# Patient Record
Sex: Male | Born: 1960 | Race: White | Hispanic: No | Marital: Single | Smoking: Never smoker
Health system: Southern US, Community
[De-identification: ages and names within clinical notes are randomized; demographics above are authoritative.]

## PROBLEM LIST (undated history)

## (undated) DIAGNOSIS — K219 Gastro-esophageal reflux disease without esophagitis: Secondary | ICD-10-CM

## (undated) DIAGNOSIS — C8330 Diffuse large B-cell lymphoma, unspecified site: Secondary | ICD-10-CM

## (undated) DIAGNOSIS — J45909 Unspecified asthma, uncomplicated: Secondary | ICD-10-CM

## (undated) DIAGNOSIS — E559 Vitamin D deficiency, unspecified: Secondary | ICD-10-CM

## (undated) DIAGNOSIS — E785 Hyperlipidemia, unspecified: Secondary | ICD-10-CM

## (undated) DIAGNOSIS — C8599 Non-Hodgkin lymphoma, unspecified, extranodal and solid organ sites: Secondary | ICD-10-CM

## (undated) DIAGNOSIS — E538 Deficiency of other specified B group vitamins: Secondary | ICD-10-CM

## (undated) DIAGNOSIS — T7840XA Allergy, unspecified, initial encounter: Secondary | ICD-10-CM

## (undated) DIAGNOSIS — R351 Nocturia: Secondary | ICD-10-CM

## (undated) DIAGNOSIS — G4730 Sleep apnea, unspecified: Secondary | ICD-10-CM

## (undated) DIAGNOSIS — M1990 Unspecified osteoarthritis, unspecified site: Secondary | ICD-10-CM

## (undated) DIAGNOSIS — I10 Essential (primary) hypertension: Secondary | ICD-10-CM

## (undated) DIAGNOSIS — Z9289 Personal history of other medical treatment: Secondary | ICD-10-CM

## (undated) DIAGNOSIS — M2550 Pain in unspecified joint: Secondary | ICD-10-CM

## (undated) DIAGNOSIS — E10319 Type 1 diabetes mellitus with unspecified diabetic retinopathy without macular edema: Secondary | ICD-10-CM

## (undated) DIAGNOSIS — E119 Type 2 diabetes mellitus without complications: Secondary | ICD-10-CM

## (undated) HISTORY — DX: Unspecified osteoarthritis, unspecified site: M19.90

## (undated) HISTORY — PX: ESOPHAGOGASTRODUODENOSCOPY: SHX1529

## (undated) HISTORY — DX: Non-Hodgkin lymphoma, unspecified, extranodal and solid organ sites: C85.99

## (undated) HISTORY — DX: Sleep apnea, unspecified: G47.30

## (undated) HISTORY — DX: Vitamin D deficiency, unspecified: E55.9

## (undated) HISTORY — DX: Allergy, unspecified, initial encounter: T78.40XA

## (undated) HISTORY — PX: LAPAROSCOPIC INCISIONAL / UMBILICAL / VENTRAL HERNIA REPAIR: SUR789

## (undated) HISTORY — PX: WISDOM TOOTH EXTRACTION: SHX21

## (undated) HISTORY — PX: OTHER SURGICAL HISTORY: SHX169

## (undated) HISTORY — PX: HERNIA REPAIR: SHX51

## (undated) HISTORY — DX: Type 1 diabetes mellitus with unspecified diabetic retinopathy without macular edema: E10.319

## (undated) HISTORY — DX: Deficiency of other specified B group vitamins: E53.8

## (undated) HISTORY — DX: Hyperlipidemia, unspecified: E78.5

---

## 1979-05-15 HISTORY — PX: PILONIDAL CYST EXCISION: SHX744

## 2001-05-14 DIAGNOSIS — Z9289 Personal history of other medical treatment: Secondary | ICD-10-CM

## 2001-05-14 HISTORY — DX: Personal history of other medical treatment: Z92.89

## 2001-05-14 HISTORY — PX: CHOLECYSTECTOMY: SHX55

## 2001-05-14 HISTORY — PX: WHIPPLE PROCEDURE: SHX2667

## 2002-01-28 ENCOUNTER — Encounter: Payer: Self-pay | Admitting: Gastroenterology

## 2002-01-28 ENCOUNTER — Ambulatory Visit (HOSPITAL_COMMUNITY): Admission: RE | Admit: 2002-01-28 | Discharge: 2002-01-28 | Payer: Self-pay | Admitting: Gastroenterology

## 2002-01-29 ENCOUNTER — Observation Stay (HOSPITAL_COMMUNITY): Admission: RE | Admit: 2002-01-29 | Discharge: 2002-01-30 | Payer: Self-pay | Admitting: Gastroenterology

## 2002-01-29 ENCOUNTER — Encounter: Payer: Self-pay | Admitting: Gastroenterology

## 2002-01-29 ENCOUNTER — Encounter (INDEPENDENT_AMBULATORY_CARE_PROVIDER_SITE_OTHER): Payer: Self-pay | Admitting: *Deleted

## 2002-02-06 ENCOUNTER — Ambulatory Visit (HOSPITAL_COMMUNITY): Admission: RE | Admit: 2002-02-06 | Discharge: 2002-02-06 | Payer: Self-pay | Admitting: Gastroenterology

## 2002-02-06 ENCOUNTER — Encounter (INDEPENDENT_AMBULATORY_CARE_PROVIDER_SITE_OTHER): Payer: Self-pay | Admitting: Specialist

## 2002-02-14 DIAGNOSIS — C8599 Non-Hodgkin lymphoma, unspecified, extranodal and solid organ sites: Secondary | ICD-10-CM

## 2002-02-14 HISTORY — DX: Non-Hodgkin lymphoma, unspecified, extranodal and solid organ sites: C85.99

## 2002-03-13 ENCOUNTER — Ambulatory Visit (HOSPITAL_COMMUNITY): Admission: RE | Admit: 2002-03-13 | Discharge: 2002-03-13 | Payer: Self-pay | Admitting: Endocrinology

## 2002-03-13 ENCOUNTER — Encounter: Payer: Self-pay | Admitting: Endocrinology

## 2002-07-15 ENCOUNTER — Ambulatory Visit: Admission: RE | Admit: 2002-07-15 | Discharge: 2002-08-28 | Payer: Self-pay | Admitting: Radiation Oncology

## 2002-08-22 ENCOUNTER — Emergency Department (HOSPITAL_COMMUNITY): Admission: EM | Admit: 2002-08-22 | Discharge: 2002-08-22 | Payer: Self-pay | Admitting: Emergency Medicine

## 2002-10-01 ENCOUNTER — Ambulatory Visit: Admission: RE | Admit: 2002-10-01 | Discharge: 2002-10-01 | Payer: Self-pay | Admitting: Radiation Oncology

## 2007-05-15 HISTORY — PX: COLONOSCOPY: SHX174

## 2008-07-15 ENCOUNTER — Ambulatory Visit: Payer: Self-pay | Admitting: Internal Medicine

## 2008-07-28 ENCOUNTER — Encounter: Payer: Self-pay | Admitting: Internal Medicine

## 2008-07-28 ENCOUNTER — Ambulatory Visit (HOSPITAL_BASED_OUTPATIENT_CLINIC_OR_DEPARTMENT_OTHER): Admission: RE | Admit: 2008-07-28 | Discharge: 2008-07-28 | Payer: Self-pay | Admitting: Internal Medicine

## 2008-08-01 DIAGNOSIS — J309 Allergic rhinitis, unspecified: Secondary | ICD-10-CM | POA: Insufficient documentation

## 2008-08-01 DIAGNOSIS — T61781A Other shellfish poisoning, accidental (unintentional), initial encounter: Secondary | ICD-10-CM | POA: Insufficient documentation

## 2008-08-01 DIAGNOSIS — T6191XA Toxic effect of unspecified seafood, accidental (unintentional), initial encounter: Secondary | ICD-10-CM | POA: Insufficient documentation

## 2008-08-01 DIAGNOSIS — E119 Type 2 diabetes mellitus without complications: Secondary | ICD-10-CM | POA: Insufficient documentation

## 2008-08-24 ENCOUNTER — Telehealth (INDEPENDENT_AMBULATORY_CARE_PROVIDER_SITE_OTHER): Payer: Self-pay | Admitting: *Deleted

## 2008-09-06 ENCOUNTER — Telehealth: Payer: Self-pay | Admitting: Internal Medicine

## 2008-09-09 ENCOUNTER — Ambulatory Visit: Payer: Self-pay | Admitting: Internal Medicine

## 2008-09-16 ENCOUNTER — Encounter: Payer: Self-pay | Admitting: Internal Medicine

## 2008-09-19 DIAGNOSIS — G4733 Obstructive sleep apnea (adult) (pediatric): Secondary | ICD-10-CM | POA: Insufficient documentation

## 2008-09-29 ENCOUNTER — Encounter: Payer: Self-pay | Admitting: Internal Medicine

## 2008-10-14 ENCOUNTER — Ambulatory Visit: Payer: Self-pay | Admitting: Internal Medicine

## 2009-04-21 ENCOUNTER — Ambulatory Visit: Payer: Self-pay | Admitting: Internal Medicine

## 2010-04-20 ENCOUNTER — Ambulatory Visit: Payer: Self-pay | Admitting: Internal Medicine

## 2010-04-20 DIAGNOSIS — E785 Hyperlipidemia, unspecified: Secondary | ICD-10-CM | POA: Insufficient documentation

## 2010-06-15 ENCOUNTER — Encounter: Payer: Self-pay | Admitting: Internal Medicine

## 2010-06-15 NOTE — Assessment & Plan Note (Signed)
Summary: rov/1 year/ mbw   Primary Provider/Referring Provider:  Altheimer  CC:  1 yr followup sleep, cpap working fine, and denies sob.  History of Present Illness: 09/09/08- OSA, allergic rhinitis NPSG- AHI 15.3 07/28/08 Moderate obstructive sleep apnea Discussed treatment options. We will start with CPAP.  10/14/08- OSA, allergic rhinitis Fair start with CPAP. Good compliance. He's not sure how much it helps yet. He did get one mask change and that seems comfortable. We discussed expectations. Daytime somnolence   April 21, 2009  OSA, allergic rhinitis He didn't try Lunesta. He continues CPAP 15 every night. He didn't take it on a 3 week trip to [place]. It isn't uncomfortable. Sometimes he's a little restless. In May he autotitrated to 13. Still doesn't feel daytime sleepiness and didn't notice that he slept differently on his trip, but he was told there he was snoring.  April 20, 2010-   OSA, allergic rhinitis Nurse-CC: 1 yr followup sleep, cpap working fine, denies sob He is doing well with CPAP at 13. Wears a pad to cushion the strap. He has tried a few nights without it and not noted major difference. Daytime tiredness isn't a problem. Never took Lunesta- not needed.  No routine cough, wheeze, chest pain.  He follows closely with his PCP.     Preventive Screening-Counseling & Management  Alcohol-Tobacco     Smoking Status: never  Current Medications (verified): 1)  Adult Aspirin Low Strength 81 Mg Tbdp (Aspirin) .... Take 1 By Mouth Once Daily 2)  Insulin Pump 3)  Vitamin D 16109 Unit Caps (Ergocalciferol) .... Take 1 By Mouth Weekly 4)  Cyanocobalamin 1000 Mcg/ml Soln (Cyanocobalamin) .... Monthly 5)  Cpap 13 Advanced 6)  Crestor 10 Mg Tabs (Rosuvastatin Calcium) .Marland Kitchen.. 1 By Mouth 5 Days A Week  Allergies: 1)  ! * Shellfish  Past History:  Past Surgical History: Last updated: 07/15/2008 Cholecystectomy [place]-2003- lymphoma head of pancreas  Family  History: Last updated: 07/15/2008 Father has OSA  Social History: Last updated: 07/15/2008 Not married Patient never smoked.  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  The patient denies shortness of breath with activity, shortness of breath at rest, productive cough, non-productive cough, coughing up blood, chest pain, irregular heartbeats, acid heartburn, indigestion, loss of appetite, abdominal pain, difficulty swallowing, sore throat, nasal congestion/difficulty breathing through nose, and sneezing.         9 lb weight loss over past year.  Vital Signs:  Patient profile:   50 year old male Height:      69 inches Weight:      214.50 pounds BMI:     31.79 O2 Sat:      97 % on Room air Pulse rate:   51 / minute BP sitting:   110 / 60  (left arm) Cuff size:   large  Vitals Entered By: Kandice Hams CMA (April 20, 2010 2:04 PM)  O2 Flow:  Room air CC: 1 yr followup sleep, cpap working fine, denies sob Comments pharamcy verfied allergies verfied   Physical Exam  Additional Exam:  General: A/Ox3;  pleasant and cooperative, NAD, alert, medium build SKIN: no rash, lesions NODES: no lymphadenopathy HEENT: [place]/AT, EOM- WNL, Conjuctivae- clear, PERRLA, TM-WNL, Nose- clear, Throat- clear and wnl, Mallampati  IV NECK: Supple w/ fair ROM, JVD- none, normal carotid impulses w/o bruits Thyroid-  CHEST: Clear to P&A HEART: RRR, no m/g/r heard ABDOMEN scaphoid EAV:WUJW, nl pulses, no edema  NEURO: Grossly intact to observation      Impression &  Recommendations:  Problem # 1:  OBSTRUCTIVE SLEEP APNEA (ICD-327.23)  Good contol and compliance on CPAP without daytime somnolence. Weight loss helps. He doesn't notice a major change right away if he skips it, but has mild to moderate OSA and ongoing compliance was discussed and encouraged.  Medications Added to Medication List This Visit: 1)  Crestor 10 Mg Tabs (Rosuvastatin calcium) .Marland Kitchen.. 1 by mouth 5 days a week 2)  Pancrease   Other Orders: Est. Patient Level III (11914)  Patient Instructions: 1)  Please schedule a follow-up appointment in 1 year. 2)  Continue CPAP at 13. Please call if needed  Prescriptions: PANCREASE   #4-6 caps AC x 0   Entered and Authorized by:   Waymon Budge MD   Signed by:   Waymon Budge MD on 04/20/2010   Method used:   Historical   RxID:   [id number]

## 2010-07-05 NOTE — Letter (Signed)
Summary: Casimiro Needle Altheimer MD  Casimiro Needle Altheimer MD   Imported By: Sherian Rein 06/30/2010 08:22:50  _____________________________________________________________________  External Attachment:    Type:   Image     Comment:   External Document

## 2010-09-26 NOTE — Procedures (Signed)
[name], Robert Mercer                  ACCOUNT NO.:  [id number]   MEDICAL RECORD NO.:  [id number]          PATIENT TYPE:  OUT   LOCATION:  SLEEP CENTER                 FACILITY[facility]  [facility]   PHYSICIAN:  Clinton D. Maple Hudson, MD, FCCP, FACPDATE OF BIRTH:  December 07[birth year] 1959   DATE OF STUDY:  07/28/2008                            NOCTURNAL POLYSOMNOGRAM   REFERRING PHYSICIAN:  Clinton D. Young, MD, FCCP, FACP   INDICATION FOR STUDY:  Hypersomnia with sleep apnea.   EPWORTH SLEEPINESS SCORE:  Epworth sleepiness score 6/24.  BMI 30.3.  Weight 205 pounds, height 69 inches.  Neck 16 inches.   MEDICATIONS:  Home medications are charted and reviewed.   SLEEP ARCHITECTURE:  Total sleep time 309 minutes with sleep efficiency  86.8%.  Stage I was 10.3%, stage II 67.2%, stage III 9.5%, REM 12.9% of  total sleep time.  Sleep latency 22 minutes, REM latency 72 minutes,  awake after sleep onset 26 minutes, arousal index 29.9.  No bedtime  medication was taken.   RESPIRATORY DATA:  Apnea-hypopnea index (AHI) 15.3 per hour.  A total of  79 events were scored including 59 obstructive apneas, 9 central apneas,  and 11 hypopneas.  Events were recorded in all sleep positions, but more  common as expected while supine.  REM AHI 52.5 per hour.  There were  insufficient early events to meet protocol for split study CPAP  titration on the study night.   OXYGEN DATA:  Moderate-to-loud snoring with oxygen desaturation to a  nadir of 86%.  Mean oxygen saturation through the study was 94.5% on  room air.   CARDIAC DATA:  Rare PAC and PVC, insignificant.   MOVEMENT-PARASOMNIA:  No significant movement disturbance.  No bathroom  trips.   IMPRESSIONS-RECOMMENDATIONS:  1. Mild-to-moderate obstructive sleep apnea/hypopnea syndrome, AHI      15.3 per hour with non-positional events somewhat more common while      supine.  Moderate-to-loud snoring with oxygen desaturation to a      nadir of 86%.  2. There were insufficient  early events to permit CPAP titration by      split protocol on the study night.  CPAP      would be a reasonable therapeutic option and he can return for CPAP      titration if appropriate.  Otherwise consider for alternative      management as clinically indicated.      Clinton D. Maple Hudson, MD, [facility], FACP  Diplomate, Biomedical engineer of Sleep Medicine  Electronically Signed     CDY/MEDQ  D:  08/07/2008 15:37:57  T:  08/08/2008 01:09:33  Job:  [id number]

## 2010-09-29 NOTE — Op Note (Signed)
NAME:  Robert Mercer, Robert Mercer                            ACCOUNT NO.:  [id number]   MEDICAL RECORD NO.:  [id number]                   PATIENT TYPE:  OBV   LOCATION:  0480                                 FACILITY:  [facility]   PHYSICIAN:  Petra Kuba, M.D.                 DATE OF BIRTH:  March 02, 1961   DATE OF PROCEDURE:  DATE OF DISCHARGE:                                 OPERATIVE REPORT   PROCEDURE:  ERCP with brushing and stent placement.   INDICATIONS FOR PROCEDURE:  A patient with obstructive jaundice, abnormal  MRCP.   Consent was signed after risks, benefits, methods, and options were  thoroughly discussed by both Dr. Kinnie Scales and myself prior to any premeds  given.   MEDICINES USED:  Demerol 75, Versed 8.   DESCRIPTION OF PROCEDURE:  The side viewing therapeutic video duodenoscope  was inserted by indirect vision into the stomach and advanced into the  duodenum in the customary fashion. No obvious endoscopic findings were seen,  the ampulla was normal. Using the triple lumen sphincterotome on initial  injection, a normal PD was seen. The sphinctertome was repositioned and  selected CBD cannulation was obtained on the next attempt. There was an  obvious distal stricture area just above a normal distal segment. We went  ahead and advanced the jag wire deep into the intrahepatics and deep  selective cannulation was obtained. We initially filled the upper ducts  which confirmed a dilated CBD above the obstruction but normal  intrahepatics. The sphinctertome was slowly withdrawn, injecting dye as we  withdrew in an effort to delineate the anatomy better again confirmed a very  distal normal approximately 1 1/2 cm segment followed by a 2-3 cm tight  strictured area followed by the dilated CBD.  At this juncture, we went  ahead and two separate brushings of the area under Fluoro and endoscopic  guidance. We elected not to do a sphincterotomy. We also elected not to try  to cannulate with the  biopsy forceps since she had a relatively small  ampulla and to do that we would have had to do a moderate size  sphincterotomy and we thought with our two brushings adequate sampling  hopefully was obtained. Certainly in the future, we could use the biopsy  forceps. Unfortunately we did not have the over the wire biopsy forceps. We  went ahead, after the brushings were obtained, and placed a 10 cm plastic  stent using the Oasis system in the customary fashion with excellent  drainage. At that juncture both endoscopically and fluoroscopically, the  stent was in the proper position, the scope was withdrawn. The patient  tolerated the procedure well. There was no obvious or immediate complication  and a post drainage film showed the contrast to drain readily.   ENDOSCOPIC DIAGNOSIS:  1. Normal small ampulla.  2. Normal pancreatic duct on one injection.  3.  Normal 1 1/2 cm distal duct and a 2-3 cm obstruction followed by a     dilated common bile duct which was brushed x 2.  4. Normal intrahepatics.   THERAPY:  A 10 French 5 cm plastic stent placed with excellent drainage.    PLAN:  Await pathology. Observe overnight for delayed complications, follow  his labs. Pending path report consider endoscopic ultrasound, angiogram or a  surgical consult next.                                               Petra Kuba, M.D.    MEM/MEDQ  D:  01/29/2002  T:  01/30/2002  Job:  [id number]   cc:   Veverly Fells. Altheimer, M.D.  [address], [address]  [place]  [place] [address]  Fax: [phone]   Griffith Citron, M.D.

## 2010-09-29 NOTE — H&P (Signed)
NAME:  ZADE, FALKNER                            ACCOUNT NO.:  [id number]   MEDICAL RECORD NO.:  [id number]                   PATIENT TYPE:  AMB   LOCATION:  ENDO                                 [facility]ACILITY:  [facility]   PHYSICIAN:  Petra Kuba, M.D.                 DATE OF BIRTH:  [birth year]   DATE OF ADMISSION:  01/29/2002  DATE OF DISCHARGE:                                HISTORY & PHYSICAL   HISTORY OF PRESENT ILLNESS:  The patient is admitted for observation after  endoscopic retrograde cholangiopancreatography which was done successfully  without obvious complication with brushing and stenting of the duct.  She  had been sick for roughly two weeks, initially started with some minimal GI  symptoms and some diarrhea.  Initially saw her primary care physician who  thought maybe a week of Cipro would do the trick, but he then developed  yellow jaundice, saw Dr. Kinnie Scales.  Workup included negative viral serologies.  Ultrasound had some sludge in the gallbladder and questionable dilated  ducts.  CT did imply the dilated ducts, and a MRCP was worrisome for a  possible cholangiocarcinoma, and I was asked by Dr. Kinnie Scales to proceed with  ERCP.   PAST MEDICAL HISTORY:  1. Insulin-dependent diabetes since age 28.  2. Increased cholesterol.  3. Pilonidal cyst removed in 1980.   MEDICATIONS:  1. Insulin pump.  2. He had been on Lipitor which was stopped when he became jaundiced, he had     been on that for about a year.  3. Aspirin q.d.  4. Cipro for the past six days.   ALLERGIES:  No known drug allergies.   FAMILY HISTORY:  Pertinent for colon cancer in a grandfather, breast cancer  in a great aunt, but no other obvious GI problems in the family.   SOCIAL HISTORY:  Socially drinks, does not smoke or use drug use.   REVIEW OF SYMPTOMS:  Pertinent for he is active in sports, had no distended  weakness, fatigue, weight loss, or other worrisome symptoms.  Unaware of any  fevers, chills, or  any significant abdominal pain.   PHYSICAL EXAMINATION:  GENERAL:  In no acute distress.  VITAL SIGNS:  Stable.  Afebrile.  HEENT:  Sclerae obviously icteric as is his skin.  NECK:  Supple without any obvious adenopathy.  LUNGS:  Clear.  HEART:  Regular rate and rhythm.  ABDOMEN:  Soft, nontender, no obvious masses.   LABORATORY DATA:  Ultrasound and CT report reviewed, and MRCP reviewed in  person yesterday with Dr. Kinnie Scales and Dr. Lyman Bishop.    ASSESSMENT:  1. 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  2. Insulin-dependent diabetes.  3. Increased cholesterol.  4. History of pilonidal cyst.   PLAN:  Customary post endoscopic retrograde cholangiopancreatography  observation.  If diabetic problems come up, we will need  to contact Dr.  Leslie Dales.  We will check sugars a.c. and h.s. per routine.  We will go  ahead and wean on __________ and consider further workup and plan, surgical  consult, etc., pending those.                                               Petra Kuba, M.D.    MEM/MEDQ  D:  01/29/2002  T:  01/29/2002  Job:  [id number]   cc:   Veverly Fells. Altheimer, M.D.  [address], [address]  [place]  [place] [address]  Fax: [phone]   Griffith Citron, M.D.

## 2011-04-19 ENCOUNTER — Ambulatory Visit: Payer: Self-pay | Admitting: Internal Medicine

## 2011-05-10 ENCOUNTER — Ambulatory Visit: Payer: Self-pay | Admitting: Internal Medicine

## 2011-08-20 ENCOUNTER — Telehealth: Payer: Self-pay | Admitting: Internal Medicine

## 2011-08-20 NOTE — Telephone Encounter (Signed)
Received copies from  [facility]  08/20/11. Forwarded 8 pages to Dr. Maple Hudson ,for review.

## 2012-07-09 ENCOUNTER — Other Ambulatory Visit: Payer: Self-pay | Admitting: Gastroenterology

## 2012-07-09 DIAGNOSIS — C8599 Non-Hodgkin lymphoma, unspecified, extranodal and solid organ sites: Secondary | ICD-10-CM

## 2012-07-09 DIAGNOSIS — R7989 Other specified abnormal findings of blood chemistry: Secondary | ICD-10-CM

## 2012-07-10 ENCOUNTER — Other Ambulatory Visit: Payer: Self-pay | Admitting: Oncology

## 2012-07-10 ENCOUNTER — Other Ambulatory Visit: Payer: Self-pay

## 2012-07-14 ENCOUNTER — Telehealth: Payer: Self-pay | Admitting: *Deleted

## 2012-07-14 ENCOUNTER — Telehealth: Payer: Self-pay | Admitting: Oncology

## 2012-07-14 NOTE — Telephone Encounter (Signed)
S/W pt in re NP appt 03/04 @ 3:30 w/Dr. Welton Flakes. Referring Dr. Kinnie Scales Dx-Pancreatic Lymphoma Welcome packet mailed.

## 2012-07-14 NOTE — Telephone Encounter (Signed)
LVOM for pt to return call.  °

## 2012-07-14 NOTE — Telephone Encounter (Signed)
Pt called to set up appt w/ Dr. Gaylyn Rong,  States he was referred by Dr. Kinnie Scales for history of B Cell Lymphoma now w/ a common bile duct obstruction.    Informed pt will give message to New Pt coordinator,  York Cerise,  To return his call.  His work number is [phone] and cell [phone].  Try work number first.   Informed pt it may not be Dr. Gaylyn Rong, but another oncologist he gets referred to.  Pt verbalized understanding.

## 2012-07-15 ENCOUNTER — Ambulatory Visit (HOSPITAL_BASED_OUTPATIENT_CLINIC_OR_DEPARTMENT_OTHER): Payer: BC Managed Care – PPO | Admitting: Oncology

## 2012-07-15 ENCOUNTER — Encounter: Payer: Self-pay | Admitting: Oncology

## 2012-07-15 ENCOUNTER — Telehealth: Payer: Self-pay | Admitting: Oncology

## 2012-07-15 VITALS — BP 159/78 | HR 59 | Temp 98.2°F | Resp 20 | Ht 69.0 in | Wt 196.2 lb

## 2012-07-15 DIAGNOSIS — C8583 Other specified types of non-Hodgkin lymphoma, intra-abdominal lymph nodes: Secondary | ICD-10-CM

## 2012-07-15 DIAGNOSIS — C8599 Non-Hodgkin lymphoma, unspecified, extranodal and solid organ sites: Secondary | ICD-10-CM

## 2012-07-15 DIAGNOSIS — R7989 Other specified abnormal findings of blood chemistry: Secondary | ICD-10-CM

## 2012-07-15 DIAGNOSIS — Z87898 Personal history of other specified conditions: Secondary | ICD-10-CM

## 2012-07-15 NOTE — Patient Instructions (Addendum)
Proceed with PET scan and CT chest   Refer to Dr. Dulce Sellar for EUS/ERCP  I will see you back in 1 month or sooner

## 2012-07-15 NOTE — Telephone Encounter (Signed)
C/D 07/15/12 for appt. 07/15/12

## 2012-07-16 ENCOUNTER — Ambulatory Visit (HOSPITAL_BASED_OUTPATIENT_CLINIC_OR_DEPARTMENT_OTHER): Payer: BC Managed Care – PPO | Admitting: Lab

## 2012-07-16 ENCOUNTER — Telehealth: Payer: Self-pay | Admitting: Medical Oncology

## 2012-07-16 DIAGNOSIS — C8583 Other specified types of non-Hodgkin lymphoma, intra-abdominal lymph nodes: Secondary | ICD-10-CM

## 2012-07-16 LAB — COMPREHENSIVE METABOLIC PANEL (CC13)
ALT: 156 U/L — ABNORMAL HIGH (ref 0–55)
AST: 173 U/L — ABNORMAL HIGH (ref 5–34)
Alkaline Phosphatase: 715 U/L — ABNORMAL HIGH (ref 40–150)
BUN: 14.7 mg/dL (ref 7.0–26.0)
Calcium: 9.4 mg/dL (ref 8.4–10.4)
Creatinine: 1 mg/dL (ref 0.7–1.3)
Total Bilirubin: 2.52 mg/dL — ABNORMAL HIGH (ref 0.20–1.20)

## 2012-07-16 LAB — CBC WITH DIFFERENTIAL/PLATELET
BASO%: 0.7 % (ref 0.0–2.0)
HCT: 42.1 % (ref 38.4–49.9)
LYMPH%: 12.3 % — ABNORMAL LOW (ref 14.0–49.0)
MCHC: 33.5 g/dL (ref 32.0–36.0)
MCV: 87.1 fL (ref 79.3–98.0)
MONO#: 1 10*3/uL — ABNORMAL HIGH (ref 0.1–0.9)
MONO%: 8.8 % (ref 0.0–14.0)
NEUT%: 75.1 % — ABNORMAL HIGH (ref 39.0–75.0)
Platelets: 316 10*3/uL (ref 140–400)
RBC: 4.84 10*6/uL (ref 4.20–5.82)
WBC: 10.8 10*3/uL — ABNORMAL HIGH (ref 4.0–10.3)

## 2012-07-16 LAB — LACTATE DEHYDROGENASE (CC13): LDH: 305 U/L — ABNORMAL HIGH (ref 125–245)

## 2012-07-16 NOTE — Telephone Encounter (Signed)
Pt LVMOM  Regarding Pet scan scheduled for Monday or he states, an option for Friday. Patient states he prefers to have the scan for Friday. Directed patient to scheduling. Patient had questions regarding when labs would be drawn. Per MD notes onc tx sent 03/04. Informed pt that scheduling will contact him regarding appt. No further questions at this time.

## 2012-07-17 ENCOUNTER — Other Ambulatory Visit: Payer: Self-pay | Admitting: Medical Oncology

## 2012-07-17 LAB — BETA 2 MICROGLOBULIN, SERUM: Beta-2 Microglobulin: 2.12 mg/L — ABNORMAL HIGH (ref 1.01–1.73)

## 2012-07-18 ENCOUNTER — Telehealth: Payer: Self-pay | Admitting: Oncology

## 2012-07-18 ENCOUNTER — Encounter: Payer: Self-pay | Admitting: *Deleted

## 2012-07-18 ENCOUNTER — Telehealth: Payer: Self-pay | Admitting: Medical Oncology

## 2012-07-18 NOTE — Telephone Encounter (Signed)
lmonvm advising the pt of his April appt

## 2012-07-18 NOTE — Telephone Encounter (Signed)
Per MD, patient given lab results dated 03/05. Patient expressed thanks. No further questions at this time.

## 2012-07-18 NOTE — Telephone Encounter (Signed)
Pt has already been seen by dr outlaw today at [facility]

## 2012-07-21 ENCOUNTER — Encounter (HOSPITAL_COMMUNITY)
Admission: RE | Admit: 2012-07-21 | Discharge: 2012-07-21 | Disposition: A | Discharge status: Home / Self Care | Payer: BC Managed Care – PPO | Source: Ambulatory Visit | Attending: Oncology | Admitting: Oncology

## 2012-07-21 ENCOUNTER — Encounter (HOSPITAL_COMMUNITY): Payer: Self-pay

## 2012-07-21 DIAGNOSIS — C8583 Other specified types of non-Hodgkin lymphoma, intra-abdominal lymph nodes: Secondary | ICD-10-CM

## 2012-07-21 MED ORDER — FLUDEOXYGLUCOSE F - 18 (FDG) INJECTION
14.6000 | Freq: Once | INTRAVENOUS | Status: AC | PRN
  Administered 2012-07-21: 14.6 via INTRAVENOUS

## 2012-07-21 MED ORDER — IOHEXOL 300 MG/ML  SOLN
80.0000 mL | Freq: Once | INTRAMUSCULAR | Status: AC | PRN
  Administered 2012-07-21: 80 mL via INTRAVENOUS

## 2012-07-22 ENCOUNTER — Encounter (HOSPITAL_COMMUNITY): Payer: Self-pay | Admitting: *Deleted

## 2012-07-22 ENCOUNTER — Telehealth: Payer: Self-pay | Admitting: Oncology

## 2012-07-22 NOTE — Pre-Procedure Instructions (Addendum)
Your procedure is scheduled on:Wednesday, July 23, 2012 Report to [facility] Admitting [id number] Call this number if you have problems morning of your procedure:[phone]  Follow all bowel prep instructions per your doctor's orders.  Do not eat or drink anything after midnight the night before your procedure. You may brush your teeth, rinse out your mouth, but no water, no food, no chewing gum, no mints, no candies, no chewing tobacco.     Take these medicines the morning of your procedure with A SIP OF WATERI: Insulin pump will be on basal rate during the procedure will discuss on arrival tomorrow.  Spoke with him at work was busy.   Please make arrangements for a responsible person to drive you home after the procedure. You cannot go home by cab/taxi. We recommend you have someone with you at home the first 24 hours after your procedure. Driver for procedure is parent Dr. Egbert Garibaldi and Pranish Akhavan [phone] or [phone]  LEAVE ALL VALUABLES, JEWELRY, BILLFOLD AT HOME.  NO DENTURES, CONTACT LENSES ALLOWED IN THE ENDOSCOPY ROOM.   YOU MAY WEAR DEODORANT, PLEASE REMOVE ALL JEWELRY, WATCHES RINGS, BODY PIERCINGS AND LEAVE AT HOME.   WOMEN: NO MAKE-UP, LOTIONS PERFUMES

## 2012-07-23 ENCOUNTER — Encounter (HOSPITAL_COMMUNITY): Payer: Self-pay | Admitting: *Deleted

## 2012-07-23 ENCOUNTER — Encounter (INDEPENDENT_AMBULATORY_CARE_PROVIDER_SITE_OTHER): Payer: Self-pay

## 2012-07-23 ENCOUNTER — Ambulatory Visit (HOSPITAL_COMMUNITY)
Admission: RE | Admit: 2012-07-23 | Discharge: 2012-07-23 | Disposition: A | Discharge status: Home / Self Care | Payer: BC Managed Care – PPO | Source: Ambulatory Visit | Attending: Gastroenterology | Admitting: Gastroenterology

## 2012-07-23 ENCOUNTER — Encounter (HOSPITAL_COMMUNITY): Payer: Self-pay | Admitting: Anesthesiology

## 2012-07-23 ENCOUNTER — Ambulatory Visit (HOSPITAL_COMMUNITY): Payer: BC Managed Care – PPO | Admitting: Anesthesiology

## 2012-07-23 ENCOUNTER — Encounter (HOSPITAL_COMMUNITY)
Admission: RE | Disposition: A | Discharge status: Home / Self Care | Payer: Self-pay | Source: Ambulatory Visit | Attending: Gastroenterology

## 2012-07-23 DIAGNOSIS — Z7982 Long term (current) use of aspirin: Secondary | ICD-10-CM | POA: Insufficient documentation

## 2012-07-23 DIAGNOSIS — Z79899 Other long term (current) drug therapy: Secondary | ICD-10-CM | POA: Insufficient documentation

## 2012-07-23 DIAGNOSIS — Z87898 Personal history of other specified conditions: Secondary | ICD-10-CM | POA: Insufficient documentation

## 2012-07-23 DIAGNOSIS — K222 Esophageal obstruction: Secondary | ICD-10-CM | POA: Insufficient documentation

## 2012-07-23 DIAGNOSIS — K449 Diaphragmatic hernia without obstruction or gangrene: Secondary | ICD-10-CM | POA: Insufficient documentation

## 2012-07-23 DIAGNOSIS — E1039 Type 1 diabetes mellitus with other diabetic ophthalmic complication: Secondary | ICD-10-CM | POA: Insufficient documentation

## 2012-07-23 DIAGNOSIS — K838 Other specified diseases of biliary tract: Secondary | ICD-10-CM | POA: Insufficient documentation

## 2012-07-23 DIAGNOSIS — E11319 Type 2 diabetes mellitus with unspecified diabetic retinopathy without macular edema: Secondary | ICD-10-CM | POA: Insufficient documentation

## 2012-07-23 DIAGNOSIS — E785 Hyperlipidemia, unspecified: Secondary | ICD-10-CM | POA: Insufficient documentation

## 2012-07-23 DIAGNOSIS — R933 Abnormal findings on diagnostic imaging of other parts of digestive tract: Secondary | ICD-10-CM | POA: Insufficient documentation

## 2012-07-23 DIAGNOSIS — Z9221 Personal history of antineoplastic chemotherapy: Secondary | ICD-10-CM | POA: Insufficient documentation

## 2012-07-23 DIAGNOSIS — K208 Other esophagitis without bleeding: Secondary | ICD-10-CM | POA: Insufficient documentation

## 2012-07-23 DIAGNOSIS — Z923 Personal history of irradiation: Secondary | ICD-10-CM | POA: Insufficient documentation

## 2012-07-23 HISTORY — DX: Type 2 diabetes mellitus without complications: E11.9

## 2012-07-23 HISTORY — PX: EUS: SHX5427

## 2012-07-23 LAB — COMPREHENSIVE METABOLIC PANEL
ALT: 110 U/L — ABNORMAL HIGH (ref 0–53)
Albumin: 2.6 g/dL — ABNORMAL LOW (ref 3.5–5.2)
Alkaline Phosphatase: 578 U/L — ABNORMAL HIGH (ref 39–117)
Calcium: 8.7 mg/dL (ref 8.4–10.5)
GFR calc Af Amer: >90 mL/min (ref >90)
Potassium: 4.3 mEq/L (ref 3.5–5.1)
Sodium: 135 mEq/L (ref 135–145)
Total Protein: 6.3 g/dL (ref 6.0–8.3)

## 2012-07-23 LAB — CBC WITH DIFFERENTIAL/PLATELET
Basophils Absolute: 0 10*3/uL (ref 0.0–0.1)
Eosinophils Relative: 1 % (ref 0–5)
HCT: 37.3 % — ABNORMAL LOW (ref 39.0–52.0)
Hemoglobin: 12.6 g/dL — ABNORMAL LOW (ref 13.0–17.0)
Lymphocytes Relative: 12 % (ref 12–46)
MCHC: 33.8 g/dL (ref 30.0–36.0)
MCV: 85.7 fL (ref 78.0–100.0)
Monocytes Absolute: 0.6 10*3/uL (ref 0.1–1.0)
Monocytes Relative: 8 % (ref 3–12)
Neutro Abs: 6.1 10*3/uL (ref 1.7–7.7)
RDW: 14.4 % (ref 11.5–15.5)
WBC: 7.8 10*3/uL (ref 4.0–10.5)

## 2012-07-23 SURGERY — ULTRASOUND, UPPER GI TRACT, ENDOSCOPIC
Anesthesia: Monitor Anesthesia Care

## 2012-07-23 MED ORDER — PROMETHAZINE HCL 25 MG/ML IJ SOLN: 6.2500 mg | INTRAMUSCULAR | Status: DC | PRN

## 2012-07-23 MED ORDER — FENTANYL CITRATE 0.05 MG/ML IJ SOLN: 25.0000 ug | INTRAMUSCULAR | Status: DC | PRN

## 2012-07-23 MED ORDER — PROPOFOL 10 MG/ML IV EMUL
INTRAVENOUS | Status: DC | PRN
  Administered 2012-07-23: 140 ug/kg/min via INTRAVENOUS

## 2012-07-23 MED ORDER — CHOLESTYRAMINE 4 G PO PACK: 1.0000 | PACK | Freq: Two times a day (BID) | ORAL | Status: DC

## 2012-07-23 MED ORDER — LACTATED RINGERS IV SOLN
INTRAVENOUS | Status: DC
  Administered 2012-07-23: 1000 mL via INTRAVENOUS

## 2012-07-23 MED ORDER — BUTAMBEN-TETRACAINE-BENZOCAINE 2-2-14 % EX AERO
INHALATION_SPRAY | CUTANEOUS | Status: DC | PRN
  Administered 2012-07-23: 2 via TOPICAL

## 2012-07-23 MED ORDER — FENTANYL CITRATE 0.05 MG/ML IJ SOLN
INTRAMUSCULAR | Status: DC | PRN
  Administered 2012-07-23 (×2): 50 ug via INTRAVENOUS

## 2012-07-23 MED ORDER — MIDAZOLAM HCL 5 MG/5ML IJ SOLN
INTRAMUSCULAR | Status: DC | PRN
  Administered 2012-07-23: 2 mg via INTRAVENOUS

## 2012-07-23 NOTE — Telephone Encounter (Signed)
Spoke to patients regarding PET scan

## 2012-07-23 NOTE — H&P (Signed)
Patient interval history reviewed.  Patient examined again.  There has been no change from documented H/P dated 07/18/12 (scanned into chart from our office) except as documented above.  Assessment:  1. Obstructive jaundice. 2.  History pancreatic lymphoma. 3.  Abnormal MRI + PET scan, hypermetabolic soft tissue prominence at surgical bed.  Plan:  1. Endoscopy. 2.  Endoscopic ultrasound with possible fine needle aspiration. 3.  Risks (bleeding, infection, bowel perforation that could require surgery, sedation-related changes in cardiopulmonary systems), benefits (identification and possible treatment of source of symptoms, exclusion of certain causes of symptoms), and alternatives (watchful waiting, radiographic imaging studies, empiric medical treatment) of upper endoscopy and upper endoscopic ultrasound (EGD + EUS) were explained to patient in detail and patient wishes to proceed.

## 2012-07-23 NOTE — Anesthesia Preprocedure Evaluation (Addendum)
Anesthesia Evaluation  Patient identified by MRN, date of birth, ID band Patient awake    Reviewed: Allergy & Precautions, H&P , NPO status , Patient's Chart, lab work & pertinent test results  Airway Mallampati: II TM Distance: >3 FB Neck ROM: Full    Dental no notable dental hx.    Pulmonary sleep apnea ,  breath sounds clear to auscultation  Pulmonary exam normal       Cardiovascular negative cardio ROS  Rhythm:Regular Rate:Normal     Neuro/Psych negative neurological ROS  negative psych ROS   GI/Hepatic negative GI ROS, Neg liver ROS,   Endo/Other  diabetes, Type 1, Insulin Dependent  Renal/GU negative Renal ROS  negative genitourinary   Musculoskeletal negative musculoskeletal ROS (+)   Abdominal   Peds negative pediatric ROS (+)  Hematology negative hematology ROS (+)   Anesthesia Other Findings   Reproductive/Obstetrics negative OB ROS                          Anesthesia Physical Anesthesia Plan  ASA: II  Anesthesia Plan: MAC   Post-op Pain Management:    Induction: Intravenous  Airway Management Planned: Nasal Cannula  Additional Equipment:   Intra-op Plan:   Post-operative Plan:   Informed Consent: I have reviewed the patients History and Physical, chart, labs and discussed the procedure including the risks, benefits and alternatives for the proposed anesthesia with the patient or authorized representative who has indicated his/her understanding and acceptance.     Plan Discussed with: CRNA and Surgeon  Anesthesia Plan Comments:         Anesthesia Quick Evaluation

## 2012-07-23 NOTE — Transfer of Care (Signed)
Immediate Anesthesia Transfer of Care Note  Patient: Robert Mercer  Procedure(s) Performed: Procedure(s): FULL UPPER ENDOSCOPIC ULTRASOUND (EUS) RADIAL (N/A)  Patient Location: PACU  Anesthesia Type:MAC  Level of Consciousness: awake, alert  and oriented  Airway & Oxygen Therapy: Patient Spontanous Breathing and Patient connected to nasal cannula oxygen  Post-op Assessment: Report given to PACU RN and Post -op Vital signs reviewed and stable  Post vital signs: Reviewed and stable  Complications: No apparent anesthesia complications

## 2012-07-23 NOTE — Anesthesia Postprocedure Evaluation (Signed)
  Anesthesia Post-op Note  Patient: Robert Mercer  Procedure(s) Performed: Procedure(s) (LRB): FULL UPPER ENDOSCOPIC ULTRASOUND (EUS) RADIAL (N/A)  Patient Location: PACU  Anesthesia Type: MAC  Level of Consciousness: awake and alert   Airway and Oxygen Therapy: Patient Spontanous Breathing  Post-op Pain: mild  Post-op Assessment: Post-op Vital signs reviewed, Patient's Cardiovascular Status Stable, Respiratory Function Stable, Patent Airway and No signs of Nausea or vomiting  Last Vitals:  Filed Vitals:   07/23/12 1031  BP: 122/72  Pulse:   Temp: 36.7 C  Resp: 18    Post-op Vital Signs: stable   Complications: No apparent anesthesia complications

## 2012-07-23 NOTE — Op Note (Addendum)
[facility] [address] [place], [address]   ENDOSCOPIC ULTRASOUND PROCEDURE REPORT  PATIENT: Robert Mercer, Robert Mercer  MR#: [id number] BIRTHDATE: [birth year]  GENDER: Male ENDOSCOPIST: Willis Modena, MD REFERRED BY:  Sharrell Ku, M.D. PROCEDURE DATE:  07/23/2012 PROCEDURE:   Upper EUS ASA CLASS:      Class II INDICATIONS:   1.  obstructive jaundice, abnormal imaging. MEDICATIONS: Cetacaine spray x 2 and MAC sedation, administered by CRNA  DESCRIPTION OF PROCEDURE:   After the risks benefits and alternatives of the procedure were  explained, informed consent was obtained. The patient was then placed in the left, lateral, decubitus postion and IV sedation was administered. Throughout the procedure, the patients blood pressure, pulse and oxygen saturations were monitored continuously.  Under direct visualization, the Pentax EUS Radial [id number]  endoscope was introduced through the mouth  and advanced to the anastomosis . Water was used as necessary to provide an acoustic interface.  Upon completion of the imaging, water was removed and the patient was sent to the recovery room in satisfactory condition.   FINDINGS:      EGD:  Small hiatal hernia with widely patent Schatzki's ring.  Moderate (LA B/C) distal erosive esophagitis. Changes of pancreaticoduodenectomy, does not appear to be pylorus-preserving.  Two distinct limbs traversed for about 10cm each, without obvious identification of the hepaticojejunal anastomosis.  No bile identified on either limb. EUS:  Pancreatic gland remnant appears grossly normal.  Intrahepatic biliary dilatation seen.  Could not discern any of the extrahepatic bile duct, despite extensive evaluation with the probe within both the gastric remnant as well as each of the jejunal limbs.  IMPRESSION:     Obstructive jaundice.  Unable to identify the hepaticojejunal anastomosis.  Unable to identify the hypermetabolic region seen on  PET-CT.  RECOMMENDATIONS:     1.  Watch for potential complications of procedure. 2.  Recheck labs today (CBC, CMP). 3.  Will discuss case with Dr. Almond Lint; if patient is still jaundiced, he will need biliary decompression (operative versus PTC) and even possible exploratory evaluation to help discern source of the obstruction (benign post-operative stricturing versus recurrent lymphoma).   _______________________________ Rosalie DoctorWillis Modena, MD 07/23/2012 10:36 AM Revised: 07/23/2012 10:36 AM  CC:

## 2012-07-24 ENCOUNTER — Encounter (HOSPITAL_COMMUNITY): Payer: Self-pay | Admitting: Gastroenterology

## 2012-07-28 ENCOUNTER — Telehealth (HOSPITAL_COMMUNITY): Payer: Self-pay | Admitting: Interventional Radiology

## 2012-07-28 NOTE — Progress Notes (Signed)
[facility]  [phone] [phone]   MEDICAL ONCOLOGY - INITIAL CONSULATION    Referral MD  Dr. 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   Chief Complaint  Patient presents with  . 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 He has been seeing Dr. Kinnie Scales for quite some time. Recently patient was noted to have elevation in his liver functions. He was seen by Dr. Marylu Lund a month ago at the time of a screening colonoscopy which was normal. In July 2013 patient had elevated LFTs. But an increasing bilirubin. Because of this he had scans performed that showed the possibility of an area of concern in the abdomen anterior to the IVC this area measured 2.5 x 2.0 cm there was a soft tissue nodule. Patient went on to have MRI. Of note patient has a history of lymphoma of the pancreas he underwent  Whipple proced at [facility]. He had been followed by them for a while. But he is now transferring his care here. Patient does need a workup for possibly recurrent lymphoma. Clinically patient seems to be doing well and has no symptoms.     Past Medical History  Diagnosis Date  . Cancer     lymphoma  . 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 Dayton Scrape  . Dyslipidemia   . Elevated liver enzymes   . Vitamin B12 deficiency   . Diabetes mellitus without complication   . Diabetic retinopathy associated with type 1 diabetes mellitus   . Arthritis     ankle-post traumatic  . Erectile dysfunction     mild  . Asthma     mild;since childhood  . Vitamin D deficiency   . Sleep apnea     mild to moderate  :  Past Surgical History  Procedure Laterality Date  . Whipple procedure  2003    pancreatic lymphoma   . Pilonidal cyst excision    . Colonoscopy  05/2007  . Ercp  01/2002    stent placement  . Eus N/A 07/23/2012    Procedure: FULL UPPER ENDOSCOPIC ULTRASOUND (EUS) RADIAL;  Surgeon: Willis Modena, MD;  Location: [facility];  Service: Endoscopy;  Laterality: N/A;  :  Current Outpatient Prescriptions  Medication Sig Dispense Refill  . aspirin 81 MG tablet Take 81 mg by mouth daily.      . cholestyramine (QUESTRAN) 4 G packet Take 1 packet by mouth 2 (two) times daily with a meal.  60 each  1  . cyanocobalamin (,VITAMIN B-12,) 1000 MCG/ML injection Inject 1,000 mcg into the muscle once.      Marland Kitchen glucosamine-chondroitin 500-400 MG tablet Take 1 tablet by mouth 3 (three) times daily.      Marland Kitchen NOVOLOG 100 UNIT/ML injection       . PANCREAZE 78469 UNITS CPEP Take 1-4 capsules by mouth 3 (three) times daily with meals.       . Vitamin D, Ergocalciferol, (DRISDOL) 50000 UNITS CAPS Take 50,000 Units by mouth every 7 (seven) days.       No current facility-administered medications for this visit.     Allergies  Allergen Reactions  . Epinephrine     Carries epi pin  . Shellfish Allergy   :  History reviewed. No  pertinent family history.:  History   Social History  . Marital Status: Married    Spouse Name: N/A    Number of Children: N/A  . Years of Education: N/A   Occupational History  . Not on file.   Social History Main Topics  . Smoking status: Never Smoker   . Smokeless tobacco: Never Used  . Alcohol Use: 1.8 oz/week    3 Cans of beer per week  . Drug Use: No  . Sexually Active: Not Currently   Other Topics Concern  . Not on file   Social History Narrative  . No narrative on file  :  A comprehensive review of systems was negative.  Exam: @IPVITALS @  General:  well-nourished in no acute distress.  Eyes:  no scleral icterus.  ENT:  There were no oropharyngeal lesions.  Neck was without thyromegaly.  Lymphatics:  Negative cervical, supraclavicular or axillary  adenopathy.  Respiratory: lungs were clear bilaterally without wheezing or crackles.  Cardiovascular:  Regular rate and rhythm, S1/S2, without murmur, rub or gallop.  There was no pedal edema.  GI:  abdomen was soft, flat, nontender, nondistended, without organomegaly.  Muscoloskeletal:  no spinal tenderness of palpation of vertebral spine.  Skin exam was without echymosis, petichae.  Neuro exam was nonfocal.  Patient was able to get on and off exam table without assistance.  Gait was normal.  Patient was alerted and oriented.  Attention was good.   Language was appropriate.  Mood was normal without depression.  Speech was not pressured.  Thought content was not tangential.     Lab Results  Component Value Date   WBC 7.8 07/23/2012   HGB 12.6* 07/23/2012   HCT 37.3* 07/23/2012   PLT 273 07/23/2012   GLUCOSE 184* 07/23/2012   ALT 110* 07/23/2012   AST 78* 07/23/2012   NA 135 07/23/2012   K 4.3 07/23/2012   CL 98 07/23/2012   CREATININE 0.79 07/23/2012   BUN 11 07/23/2012   CO2 27 07/23/2012    Ct Chest W Contrast  07/21/2012  *RADIOLOGY REPORT*  Clinical Data: History of pancreatic lymphoma.  Evaluate for metastatic disease.  CT CHEST WITH CONTRAST  Technique:  Multidetector CT imaging of the chest was performed following the standard protocol during bolus administration of intravenous contrast.  Contrast: 80mL OMNIPAQUE IOHEXOL 300 MG/ML  SOLN  Comparison: PET CT 07/21/2012.  Findings:  Mediastinum: Heart size is normal. There is no significant pericardial fluid, thickening or pericardial calcification. No pathologically enlarged mediastinal or hilar lymph nodes. There is a small hiatal hernia.  Lungs/Pleura: Linear opacities in the lower lobes of the lungs bilaterally may reflect areas of chronic scarring or subsegmental atelectasis.  There is a small amount of nodular thickening in the major fissures bilaterally.  Additionally, there are a few scattered 1-2 mm nodules, best demonstrated by a 2 mm nodule in  the periphery of the right upper lobe (image 22 of series 5).  No other larger more suspicious appearing pulmonary nodules or masses are otherwise noted.  No acute consolidative airspace disease.  No pleural effusions.  Upper Abdomen: Moderate intrahepatic biliary ductal dilatation.  Musculoskeletal: There are no aggressive appearing lytic or blastic lesions noted in the visualized portions of the skeleton.  IMPRESSION: 1.  No findings to strongly suggest presence of metastatic disease in the chest. 2.  Nonspecific 1-2 mm pulmonary nodules and mild nodularity along the major fissures bilaterally.  Attention on any future follow up chest CTs is recommended,  however, these findings are favored to be benign. 3.  Small hiatal hernia. 4.  Moderate intrahepatic biliary ductal dilatation.   Original Report Authenticated By: Trudie Reed, M.D.    Nm Pet Image Restag (ps) Skull Base To Thigh  07/21/2012  *RADIOLOGY REPORT*  Clinical Data: Subsequent treatment strategy for pancreatic lymphoma. Whipple procedure 2003.  NUCLEAR MEDICINE PET SKULL BASE TO THIGH  Fasting Blood Glucose:  256  Technique:  14.2 mCi F-18 FDG was injected intravenously. CT data was obtained and used for attenuation correction and anatomic localization only.  (This was not acquired as a diagnostic CT examination.) Additional exam technical data entered on technologist worksheet.  Comparison:  CT report 03/13/2002  Findings:  Neck: No hypermetabolic lymph nodes in the neck.  Chest:  No hypermetabolic mediastinal or hilar nodes.  No suspicious pulmonary nodules on the CT scan.  Abdomen/Pelvis:  Patient status post Whipple procedure. There are two adjacent foci of hypermetabolic activity within the central abdomen at the resection site with SUV max = 6.1 (image 146 and 147).   There is no clear nodular lesion at this level.  Cannot exclude a small lymph node.  No abnormal activity within the liver.  There is mild biliary ductal dilatation.  No  retroperitoneal  or periportal lymphadenopathy.  Ventral hernia contains a loop of nonobstructed small bowel.  Skeleton:  No focal hypermetabolic activity to suggest skeletal metastasis.  IMPRESSION:  1.  Two hypermetabolic foci within the central abdomen at Whipple surgical margin.  Cannot exclude local recurrence although no clear node or masses evident on the CT portion.  This is in the vicinity of the biliary enteric anastomosis.   If concern for recurrence or obstruction, consider MRI abdomen with contrast.  2.  No evidence of distant metastasis.   Original Report Authenticated By: Genevive Bi, M.D.       Ct Chest W Contrast  07/21/2012  *RADIOLOGY REPORT*  Clinical Data: History of pancreatic lymphoma.  Evaluate for metastatic disease.  CT CHEST WITH CONTRAST  Technique:  Multidetector CT imaging of the chest was performed following the standard protocol during bolus administration of intravenous contrast.  Contrast: 80mL OMNIPAQUE IOHEXOL 300 MG/ML  SOLN  Comparison: PET CT 07/21/2012.  Findings:  Mediastinum: Heart size is normal. There is no significant pericardial fluid, thickening or pericardial calcification. No pathologically enlarged mediastinal or hilar lymph nodes. There is a small hiatal hernia.  Lungs/Pleura: Linear opacities in the lower lobes of the lungs bilaterally may reflect areas of chronic scarring or subsegmental atelectasis.  There is a small amount of nodular thickening in the major fissures bilaterally.  Additionally, there are a few scattered 1-2 mm nodules, best demonstrated by a 2 mm nodule in the periphery of the right upper lobe (image 22 of series 5).  No other larger more suspicious appearing pulmonary nodules or masses are otherwise noted.  No acute consolidative airspace disease.  No pleural effusions.  Upper Abdomen: Moderate intrahepatic biliary ductal dilatation.  Musculoskeletal: There are no aggressive appearing lytic or blastic lesions noted in the visualized  portions of the skeleton.  IMPRESSION: 1.  No findings to strongly suggest presence of metastatic disease in the chest. 2.  Nonspecific 1-2 mm pulmonary nodules and mild nodularity along the major fissures bilaterally.  Attention on any future follow up chest CTs is recommended, however, these findings are favored to be benign. 3.  Small hiatal hernia. 4.  Moderate intrahepatic biliary ductal dilatation.   Original Report Authenticated By: Reuel Boom  Entrikin, M.D.    Nm Pet Image Restag (ps) Skull Base To Thigh  07/21/2012  *RADIOLOGY REPORT*  Clinical Data: Subsequent treatment strategy for pancreatic lymphoma. Whipple procedure 2003.  NUCLEAR MEDICINE PET SKULL BASE TO THIGH  Fasting Blood Glucose:  256  Technique:  14.2 mCi F-18 FDG was injected intravenously. CT data was obtained and used for attenuation correction and anatomic localization only.  (This was not acquired as a diagnostic CT examination.) Additional exam technical data entered on technologist worksheet.  Comparison:  CT report 03/13/2002  Findings:  Neck: No hypermetabolic lymph nodes in the neck.  Chest:  No hypermetabolic mediastinal or hilar nodes.  No suspicious pulmonary nodules on the CT scan.  Abdomen/Pelvis:  Patient status post Whipple procedure. There are two adjacent foci of hypermetabolic activity within the central abdomen at the resection site with SUV max = 6.1 (image 146 and 147).   There is no clear nodular lesion at this level.  Cannot exclude a small lymph node.  No abnormal activity within the liver.  There is mild biliary ductal dilatation.  No retroperitoneal  or periportal lymphadenopathy.  Ventral hernia contains a loop of nonobstructed small bowel.  Skeleton:  No focal hypermetabolic activity to suggest skeletal metastasis.  IMPRESSION:  1.  Two hypermetabolic foci within the central abdomen at Whipple surgical margin.  Cannot exclude local recurrence although no clear node or masses evident on the CT portion.  This is in  the vicinity of the biliary enteric anastomosis.   If concern for recurrence or obstruction, consider MRI abdomen with contrast.  2.  No evidence of distant metastasis.   Original Report Authenticated By: Genevive Bi, M.D.     Assessment and Plan: 52 year old gentleman with prior history of lymphoma of the pancreas status post Whipple procedure. This was treated at [facility]. Patient recently with elevated liver function studies including elevated T. bili. CT scan revealed a possibility of a mass at the previous surgical site. Because of this he is seen in medical oncology for further workup. I have recommended doing a PET scan to see if this area of concern measuring 2.5 cm lights up. If it does then he will need a tissue diagnosis. I will refer him to GI to do I endoscopic ultrasound. We discussed this. If in endoscopic ultrasound is not possible then certainly he will need an open biopsy. I will plan on seeing him back after a tissue diagnosis.  Drue Second, MD Medical/Oncology [facility] [phone] (beeper) [phone] (Office)

## 2012-07-30 ENCOUNTER — Ambulatory Visit (INDEPENDENT_AMBULATORY_CARE_PROVIDER_SITE_OTHER): Payer: Self-pay | Admitting: General Surgery

## 2012-08-07 ENCOUNTER — Encounter (INDEPENDENT_AMBULATORY_CARE_PROVIDER_SITE_OTHER): Payer: Self-pay

## 2012-08-14 ENCOUNTER — Ambulatory Visit: Payer: BC Managed Care – PPO | Admitting: Oncology

## 2013-03-19 ENCOUNTER — Other Ambulatory Visit: Payer: Self-pay

## 2015-08-02 ENCOUNTER — Emergency Department (HOSPITAL_COMMUNITY)
Admission: EM | Admit: 2015-08-02 | Discharge: 2015-08-02 | Disposition: A | Discharge status: Home / Self Care | Payer: BLUE CROSS/BLUE SHIELD | Attending: Emergency Medicine | Admitting: Emergency Medicine

## 2015-08-02 ENCOUNTER — Encounter (HOSPITAL_COMMUNITY): Payer: Self-pay | Admitting: Emergency Medicine

## 2015-08-02 DIAGNOSIS — R6883 Chills (without fever): Secondary | ICD-10-CM | POA: Insufficient documentation

## 2015-08-02 DIAGNOSIS — E10319 Type 1 diabetes mellitus with unspecified diabetic retinopathy without macular edema: Secondary | ICD-10-CM | POA: Diagnosis not present

## 2015-08-02 DIAGNOSIS — J45909 Unspecified asthma, uncomplicated: Secondary | ICD-10-CM | POA: Insufficient documentation

## 2015-08-02 DIAGNOSIS — R5383 Other fatigue: Secondary | ICD-10-CM | POA: Diagnosis not present

## 2015-08-02 LAB — CBG MONITORING, ED: GLUCOSE-CAPILLARY: 184 mg/dL — AB (ref 65–99)

## 2015-08-02 NOTE — ED Notes (Signed)
Writer called for room assignment no response 

## 2015-08-02 NOTE — ED Notes (Signed)
Pt states that he went to bed early last night because he was tired.  Pt states that he was at work today and began having chills and felt very lethargic.  Pt denies any other complaints but states that he called his doctor and they told him to come to the ER for an EKG and CT scan.

## 2016-06-05 ENCOUNTER — Ambulatory Visit: Payer: Self-pay | Admitting: Surgery

## 2016-08-24 ENCOUNTER — Encounter (HOSPITAL_COMMUNITY): Payer: Self-pay

## 2016-08-24 ENCOUNTER — Encounter (HOSPITAL_COMMUNITY)
Admission: RE | Admit: 2016-08-24 | Discharge: 2016-08-24 | Disposition: A | Discharge status: Home / Self Care | Payer: BLUE CROSS/BLUE SHIELD | Source: Ambulatory Visit | Attending: Surgery | Admitting: Surgery

## 2016-08-24 ENCOUNTER — Other Ambulatory Visit: Payer: Self-pay

## 2016-08-24 DIAGNOSIS — K432 Incisional hernia without obstruction or gangrene: Secondary | ICD-10-CM | POA: Diagnosis not present

## 2016-08-24 DIAGNOSIS — Z01818 Encounter for other preprocedural examination: Secondary | ICD-10-CM | POA: Diagnosis not present

## 2016-08-24 DIAGNOSIS — Z01812 Encounter for preprocedural laboratory examination: Secondary | ICD-10-CM | POA: Diagnosis not present

## 2016-08-24 HISTORY — DX: Nocturia: R35.1

## 2016-08-24 HISTORY — DX: Gastro-esophageal reflux disease without esophagitis: K21.9

## 2016-08-24 HISTORY — DX: Pain in unspecified joint: M25.50

## 2016-08-24 HISTORY — DX: Essential (primary) hypertension: I10

## 2016-08-24 HISTORY — DX: Personal history of other medical treatment: Z92.89

## 2016-08-24 LAB — BASIC METABOLIC PANEL
ANION GAP: 12 (ref 5–15)
BUN: 11 mg/dL (ref 6–20)
CALCIUM: 9 mg/dL (ref 8.9–10.3)
CO2: 26 mmol/L (ref 22–32)
Chloride: 91 mmol/L — ABNORMAL LOW (ref 101–111)
Creatinine, Ser: 1.21 mg/dL (ref 0.61–1.24)
GFR calc non Af Amer: >60 mL/min (ref >60)
GLUCOSE: 139 mg/dL — AB (ref 65–99)
Potassium: 3.8 mmol/L (ref 3.5–5.1)
Sodium: 129 mmol/L — ABNORMAL LOW (ref 135–145)

## 2016-08-24 LAB — CBC
HEMATOCRIT: 39.7 % (ref 39.0–52.0)
Hemoglobin: 13.6 g/dL (ref 13.0–17.0)
MCH: 27.5 pg (ref 26.0–34.0)
MCHC: 34.3 g/dL (ref 30.0–36.0)
MCV: 80.2 fL (ref 78.0–100.0)
Platelets: 253 10*3/uL (ref 150–400)
RBC: 4.95 MIL/uL (ref 4.22–5.81)
RDW: 13.8 % (ref 11.5–15.5)
WBC: 9.6 10*3/uL (ref 4.0–10.5)

## 2016-08-24 LAB — GLUCOSE, CAPILLARY: GLUCOSE-CAPILLARY: 144 mg/dL — AB (ref 65–99)

## 2016-08-24 MED ORDER — CHLORHEXIDINE GLUCONATE CLOTH 2 % EX PADS: 6.0000 | MEDICATED_PAD | Freq: Once | CUTANEOUS | Status: DC

## 2016-08-24 NOTE — Pre-Procedure Instructions (Signed)
Robert Mercer  08/24/2016      [facility] - [place], [place] [address] Phone: [phone] Fax: [phone]    Your procedure is scheduled on Mon, April 23 @ 7:30 AM  Report to [facility] Admitting at 5:30 AM  Call this number if you have problems the morning of surgery:  [phone]   Remember:  Do not eat food or drink liquids after midnight.  Take these medicines the morning of surgery with A SIP OF WATER Bystolic              Stop taking any Vitamins or Herbal Medications. No Goody's,BC's,Aleve,Advil,Motrin,Ibuprofen,or Fish Oil.      How to Manage Your Diabetes Before and After Surgery  Why is it important to control my blood sugar before and after surgery? . Improving blood sugar levels before and after surgery helps healing and can limit problems. . A way of improving blood sugar control is eating a healthy diet by: o  Eating less sugar and carbohydrates o  Increasing activity/exercise o  Talking with your doctor about reaching your blood sugar goals . High blood sugars (greater than 180 mg/dL) can raise your risk of infections and slow your recovery, so you will need to focus on controlling your diabetes during the weeks before surgery. . Make sure that the doctor who takes care of your diabetes knows about your planned surgery including the date and location.  How do I manage my blood sugar before surgery? . Check your blood sugar at least 4 times a day, starting 2 days before surgery, to make sure that the level is not too high or low. o Check your blood sugar the morning of your surgery when you wake up and every 2 hours until you get to the Short Stay unit. . If your blood sugar is less than 70 mg/dL, you will need to treat for low blood sugar: o Do not take insulin. o Treat a low blood sugar (less than 70 mg/dL) with  cup of clear juice (cranberry or apple), 4  glucose tablets, OR glucose gel. o Recheck blood sugar in 15 minutes after treatment (to make sure it is greater than 70 mg/dL). If your blood sugar is not greater than 70 mg/dL on recheck, call [phone] for further instructions. . Report your blood sugar to the short stay nurse when you get to Short Stay.  . If you are admitted to the hospital after surgery: o Your blood sugar will be checked by the staff and you will probably be given insulin after surgery (instead of oral diabetes medicines) to make sure you have good blood sugar levels. o The goal for blood sugar control after surgery is 80-180 mg/dL.              WHAT DO I DO ABOUT MY DIABETES MEDICATION?   Marland Kitchen Do not take oral diabetes medicines (pills) the morning of surgery.    . The day of surgery, do not take other diabetes injectables, including Byetta (exenatide), Bydureon (exenatide ER), Victoza (liraglutide), or Trulicity (dulaglutide).  . If your CBG is greater than 220 mg/dL, you may take  of your sliding scale (correction) dose of insulin.  Other Instructions:           Reviewed and Endorsed by [facility] Patient Education Committee, August 2015   Do not wear jewelry.  Do not wear lotions, powders,colognes, or deoderant.  Men may shave face and neck.  Do not bring valuables to the hospital.  [facility] is not responsible for any belongings or valuables.  Contacts, dentures or bridgework may not be worn into surgery.  Leave your suitcase in the car.  After surgery it may be brought to your room.  For patients admitted to the hospital, discharge time will be determined by your treatment team.  Patients discharged the day of surgery will not be allowed to drive home.    [facility] - Pre[facility]  Before surgery, you can play an important role.  Because skin is not sterile, your skin needs to be as free of germs as possible.  You can reduce the number of germs on you  skin by washing with CHG (chlorahexidine gluconate) soap before surgery.  CHG is an antiseptic cleaner which kills germs and bonds with the skin to continue killing germs even after washing.  Please DO NOT use if you have an allergy to CHG or antibacterial soaps.  If your skin becomes reddened/irritated stop using the CHG and inform your nurse when you arrive at Short Stay.  Do not shave (including legs and underarms) for at least 48 hours prior to the first CHG shower.  You may shave your face.  Please follow these instructions carefully:   1.  Shower with CHG Soap the night before surgery and the                                morning of Surgery.  2.  If you choose to wash your hair, wash your hair first as usual with your       normal shampoo.  3.  After you shampoo, rinse your hair and body thoroughly to remove the                      Shampoo.  4.  Use CHG as you would any other liquid soap.  You can apply chg directly       to the skin and wash gently with scrungie or a clean washcloth.  5.  Apply the CHG Soap to your body ONLY FROM THE NECK DOWN.        Do not use on open wounds or open sores.  Avoid contact with your eyes,       ears, mouth and genitals (private parts).  Wash genitals (private parts)       with your normal soap.  6.  Wash thoroughly, paying special attention to the area where your surgery        will be performed.  7.  Thoroughly rinse your body with warm water from the neck down.  8.  DO NOT shower/wash with your normal soap after using and rinsing off       the CHG Soap.  9.  Pat yourself dry with a clean towel.            10.  Wear clean pajamas.            11.  Place clean sheets on your bed the night of your first shower and do not        sleep with pets.  Day of Surgery  Do not apply any lotions/deoderants the morning of surgery.  Please wear clean clothes to the hospital/surgery center.    Please read over the following fact sheets that you were  given. Pain  Booklet, Coughing and Deep Breathing and Surgical Site Infection Prevention

## 2016-08-24 NOTE — Progress Notes (Addendum)
Cardiologist denies  Medical Md is Dr.Michael Altheimer  Echo denies  Stress test denies  Heart cath denies  EKG denies in past yr  CXR denies

## 2016-08-25 LAB — HEMOGLOBIN A1C
HEMOGLOBIN A1C: 7.7 % — AB (ref 4.8–5.6)
MEAN PLASMA GLUCOSE: 174 mg/dL

## 2016-08-28 NOTE — Progress Notes (Signed)
Anesthesia Chart Review: Patient is a 56 year old male scheduled for upper scopic ventral incisional hernia repair on 09/03/2016 by Dr. Harlow Asa.  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 BMI is consistent with obesity.   Endocrinologist is Dr. Legrand Como Altheimer, last visit 08/23/16. He is aware of surgery plans. Patient does not have a history of DKA.   Meds include ASA 81 mg, Bystolic, Creon, Nexium, Lozol, Novolog (insulin pump), valsartan.   BP (!) 143/62   Pulse 66   Temp 36.9 C   Resp 20   Wt 209 lb 14.4 oz (95.2 kg)   SpO2 99%   BMI 31.00 kg/m   EKG 08/24/16: NSR.  Preoperative labs noted. Na 129. Cr 1.21. CBC WNL. A1c 7.7. Will check an ISTAT4 on the day of surgery to re-evaluate for hyponatremia. If results stable or improved then I would anticipate that he could proceed as planned. DM coordinator consult sent due to DM1 with insulin pump.   George Hugh [facility] Short Stay Center/Anesthesiology Phone [phone] 08/28/2016 1:54 PM

## 2016-08-30 ENCOUNTER — Encounter (HOSPITAL_COMMUNITY): Payer: Self-pay | Admitting: Surgery

## 2016-08-30 DIAGNOSIS — K432 Incisional hernia without obstruction or gangrene: Secondary | ICD-10-CM | POA: Diagnosis present

## 2016-08-30 NOTE — H&P (Signed)
General Surgery [facility]  SEYMOUR PAVLAK DOB: February 27, 1961 Single / Language: Robert Mercer / Race: White Male  History of Present Illness   The patient is a 56 year old male who presents with an incisional hernia.  Patient is self-referred. 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 He subsequently developed a ventral incisional hernia. He does have a history of chemotherapy and radiation to the abdomen. Hernia has gradually increased in size. It causes intermittent minor discomfort. He has had no obstructive symptoms. He has had no other abdominal surgery. Patient is a type I diabetic on insulin. He does take medication for hypertension. Patient presents today to discuss methods of hernia repair.  Past Surgical History Colon Removal - Partial  Gallbladder Surgery - Open  Oral Surgery  Pancreas Surgery   Allergies No Known Drug Allergies 05/01/2016  Medication History One Touch Test Strips (In Vitro daily) Active. Indapamide (2.5MG  Tablet, Oral daily) Active. Valsartan (320MG  Tablet, Oral daily) Active. Bystolic (10MG  Tablet, Oral daily) Active. Creon (24000-76000UNIT Capsule DR Part, Oral daily) Active. NovoLOG (100UNIT/ML Solution, Subcutaneous daily) Active. (Use as directed.) Esomeprazole Magnesium (20MG  Capsule DR, Oral daily) Active. Pancreaze (21000UNIT Capsule DR Part, Oral daily) Active. Medications Reconciled  Social History Alcohol use  Occasional alcohol use. Caffeine use  Coffee. No drug use  Tobacco use  Never smoker.  Family History  First Degree Relatives  No pertinent family history   Review of Systems  General Not Present- Appetite Loss, Chills, Fatigue, Fever, Night Sweats, Weight Gain and Weight Loss. Skin Not Present- Change in Wart/Mole, Dryness, Hives, Jaundice, New  Lesions, Non-Healing Wounds, Rash and Ulcer. HEENT Present- Wears glasses/contact lenses. Not Present- Earache, Hearing Loss, Hoarseness, Nose Bleed, Oral Ulcers, Ringing in the Ears, Seasonal Allergies, Sinus Pain, Sore Throat, Visual Disturbances and Yellow Eyes. Respiratory Not Present- Bloody sputum, Chronic Cough, Difficulty Breathing, Snoring and Wheezing. Breast Not Present- Breast Mass, Breast Pain, Nipple Discharge and Skin Changes. Cardiovascular Not Present- Chest Pain, Difficulty Breathing Lying Down, Leg Cramps, Palpitations, Rapid Heart Rate, Shortness of Breath and Swelling of Extremities. Gastrointestinal Not Present- Abdominal Pain, Bloating, Bloody Stool, Change in Bowel Habits, Chronic diarrhea, Constipation, Difficulty Swallowing, Excessive gas, Gets full quickly at meals, Hemorrhoids, Indigestion, Nausea, Rectal Pain and Vomiting. Male Genitourinary Not Present- Blood in Urine, Change in Urinary Stream, Frequency, Impotence, Nocturia, Painful Urination, Urgency and Urine Leakage. Musculoskeletal Not Present- Back Pain, Joint Pain, Joint Stiffness, Muscle Pain, Muscle Weakness and Swelling of Extremities. Neurological Not Present- Decreased Memory, Fainting, Headaches, Numbness, Seizures, Tingling, Tremor, Trouble walking and Weakness. Psychiatric Not Present- Anxiety, Bipolar, Change in Sleep Pattern, Depression, Fearful and Frequent crying. Endocrine Not Present- Cold Intolerance, Excessive Hunger, Hair Changes, Heat Intolerance, Hot flashes and New Diabetes. Hematology Not Present- Blood Thinners, Easy Bruising, Excessive bleeding, Gland problems, HIV and Persistent Infections.  Vitals Weight: 211.6 lb Height: 69in Body Surface Area: 2.12 m Body Mass Index: 31.25 kg/m  Temp.: 97.80F  Pulse: 51 (Regular)  BP: 112/58 (Sitting, Left Arm, Standard)  Physical Exam  The physical exam findings are as follows: Note:General - appears comfortable, no distress; not  diaphorectic  HEENT - normocephalic; sclerae clear, gaze conjugate; mucous membranes moist, dentition good; voice normal  Neck - symmetric on extension; no palpable anterior or posterior cervical adenopathy; no palpable masses in the thyroid bed  Chest - clear  bilaterally without rhonchi, rales, or wheeze  Cor - regular rhythm with normal rate; no significant murmur  Abd - soft without distension; well-healed midline incision which is completely epithelialized but likely represented an open wound which healed by secondary intention; there is an obvious bulge to the left of midline above the level of the umbilicus; this is reducible and the fascial defect appears to be adjacent to the midline on the left measuring approximately 4 cm in greatest diameter and appears to be a single defect  Ext - non-tender without significant edema or lymphedema  Neuro - grossly intact; no tremor    Assessment & Plan  INCISIONAL HERNIA, WITHOUT OBSTRUCTION OR GANGRENE (K43.2)  Patient presents with a ventral incisional hernia which has been present for a number of years and gradually increasing in size. Patient has had no prior repairs.  After examination, the patient has a ventral incisional hernia with probably a single fascial defect just to the left of midline in the mid abdomen. Given his history of prior surgery and radiation, I think he would be an excellent candidate for laparoscopic ventral incisional hernia repair with mesh patch. The patient and I discussed this procedure at length. I provided him with written literature to review at home. I would recommend a bowel prep prior to the procedure. I would anticipate a 2-3 day hospital stay. We discussed the use of an abdominal binder following the procedure. We discussed approximately 3 weeks out of work. We discussed restrictions on his physical activities for 6 weeks following the procedure. He understands and would like to consider this and  evaluate his schedule before making a decision regarding surgery.  Patient has a local endocrinologist who we would involve with his care during his hospitalization.  The risks and benefits of the procedure have been discussed at length with the patient.  The patient understands the proposed procedure, potential alternative treatments, and the course of recovery to be expected.  All of the patient's questions have been answered at this time.  The patient wishes to proceed with surgery.  Earnstine Regal, MD, [facility] Office: [phone]

## 2016-09-02 MED ORDER — SODIUM CHLORIDE 0.9 % IV SOLN
1.0000 g | INTRAVENOUS | Status: AC
  Administered 2016-09-03: 1 g via INTRAVENOUS
  Filled 2016-09-02: qty 1

## 2016-09-02 NOTE — Anesthesia Preprocedure Evaluation (Signed)
Anesthesia Evaluation  Patient identified by MRN, date of birth, ID band Patient awake    Reviewed: Allergy & Precautions, H&P , Patient's Chart, lab work & pertinent test results, reviewed documented beta blocker date and time   History of Anesthesia Complications (+) history of anesthetic complications  Airway Mallampati: II  TM Distance: >3 FB Neck ROM: full    Dental no notable dental hx. (+) Teeth Intact   Pulmonary    Pulmonary exam normal breath sounds clear to auscultation       Cardiovascular hypertension,  Rhythm:regular Rate:Normal     Neuro/Psych    GI/Hepatic   Endo/Other  diabetes  Renal/GU      Musculoskeletal   Abdominal   Peds  Hematology   Anesthesia Other Findings       Reproductive/Obstetrics (+) Pregnancy                             Anesthesia Physical Anesthesia Plan  ASA: II  Anesthesia Plan: General   Post-op Pain Management:    Induction: Intravenous  Airway Management Planned: Oral ETT  Additional Equipment:   Intra-op Plan:   Post-operative Plan: Extubation in OR  Informed Consent: I have reviewed the patients History and Physical, chart, labs and discussed the procedure including the risks, benefits and alternatives for the proposed anesthesia with the patient or authorized representative who has indicated his/her understanding and acceptance.   Dental Advisory Given  Plan Discussed with: CRNA and Surgeon  Anesthesia Plan Comments: (  )        Anesthesia Quick Evaluation

## 2016-09-03 ENCOUNTER — Encounter (HOSPITAL_COMMUNITY)
Admission: RE | Disposition: A | Discharge status: Home / Self Care | Payer: Self-pay | Source: Ambulatory Visit | Attending: Surgery

## 2016-09-03 ENCOUNTER — Inpatient Hospital Stay (HOSPITAL_COMMUNITY): Payer: BLUE CROSS/BLUE SHIELD | Admitting: Vascular Surgery

## 2016-09-03 ENCOUNTER — Inpatient Hospital Stay (HOSPITAL_COMMUNITY)
Admission: RE | Admit: 2016-09-03 | Discharge: 2016-09-05 | DRG: 337 | Disposition: A | Discharge status: Home / Self Care | Payer: BLUE CROSS/BLUE SHIELD | Source: Ambulatory Visit | Attending: Surgery | Admitting: Surgery

## 2016-09-03 ENCOUNTER — Encounter (HOSPITAL_COMMUNITY): Payer: Self-pay | Admitting: Certified Registered"

## 2016-09-03 DIAGNOSIS — Z8572 Personal history of non-Hodgkin lymphomas: Secondary | ICD-10-CM

## 2016-09-03 DIAGNOSIS — Z794 Long term (current) use of insulin: Secondary | ICD-10-CM

## 2016-09-03 DIAGNOSIS — Z923 Personal history of irradiation: Secondary | ICD-10-CM

## 2016-09-03 DIAGNOSIS — Z9221 Personal history of antineoplastic chemotherapy: Secondary | ICD-10-CM

## 2016-09-03 DIAGNOSIS — Z9641 Presence of insulin pump (external) (internal): Secondary | ICD-10-CM | POA: Diagnosis present

## 2016-09-03 DIAGNOSIS — I10 Essential (primary) hypertension: Secondary | ICD-10-CM | POA: Diagnosis present

## 2016-09-03 DIAGNOSIS — E109 Type 1 diabetes mellitus without complications: Secondary | ICD-10-CM | POA: Diagnosis present

## 2016-09-03 DIAGNOSIS — K432 Incisional hernia without obstruction or gangrene: Principal | ICD-10-CM | POA: Diagnosis present

## 2016-09-03 DIAGNOSIS — Z90411 Acquired partial absence of pancreas: Secondary | ICD-10-CM

## 2016-09-03 DIAGNOSIS — K660 Peritoneal adhesions (postprocedural) (postinfection): Secondary | ICD-10-CM | POA: Diagnosis present

## 2016-09-03 HISTORY — PX: VENTRAL HERNIA REPAIR: SHX424

## 2016-09-03 HISTORY — DX: Diffuse large B-cell lymphoma, unspecified site: C83.30

## 2016-09-03 HISTORY — PX: INSERTION OF MESH: SHX5868

## 2016-09-03 HISTORY — DX: Unspecified asthma, uncomplicated: J45.909

## 2016-09-03 LAB — POCT I-STAT 4, (NA,K, GLUC, HGB,HCT)
Glucose, Bld: 118 mg/dL — ABNORMAL HIGH (ref 65–99)
HEMATOCRIT: 40 % (ref 39.0–52.0)
HEMOGLOBIN: 13.6 g/dL (ref 13.0–17.0)
POTASSIUM: 3.7 mmol/L (ref 3.5–5.1)
Sodium: 129 mmol/L — ABNORMAL LOW (ref 135–145)

## 2016-09-03 LAB — GLUCOSE, CAPILLARY
GLUCOSE-CAPILLARY: 85 mg/dL (ref 65–99)
GLUCOSE-CAPILLARY: 169 mg/dL — AB (ref 65–99)
GLUCOSE-CAPILLARY: 256 mg/dL — AB (ref 65–99)
GLUCOSE-CAPILLARY: 145 mg/dL — AB (ref 65–99)
Glucose-Capillary: 124 mg/dL — ABNORMAL HIGH (ref 65–99)
Glucose-Capillary: 145 mg/dL — ABNORMAL HIGH (ref 65–99)
Glucose-Capillary: 213 mg/dL — ABNORMAL HIGH (ref 65–99)
Glucose-Capillary: 243 mg/dL — ABNORMAL HIGH (ref 65–99)

## 2016-09-03 SURGERY — REPAIR, HERNIA, VENTRAL, LAPAROSCOPIC
Anesthesia: General | Site: Abdomen

## 2016-09-03 MED ORDER — SODIUM CHLORIDE 0.9 % IR SOLN
Status: DC | PRN
  Administered 2016-09-03: 1

## 2016-09-03 MED ORDER — GLYCOPYRROLATE 0.2 MG/ML IJ SOLN
INTRAMUSCULAR | Status: DC | PRN
  Administered 2016-09-03: 0.2 mg via INTRAVENOUS

## 2016-09-03 MED ORDER — PANCRELIPASE (LIP-PROT-AMYL) 36000-114000 UNITS PO CPEP
144000.0000 [IU] | ORAL_CAPSULE | Freq: Three times a day (TID) | ORAL | Status: DC
  Administered 2016-09-03 – 2016-09-05 (×5): 144000 [IU] via ORAL
  Filled 2016-09-03 (×7): qty 4

## 2016-09-03 MED ORDER — SUGAMMADEX SODIUM 200 MG/2ML IV SOLN
INTRAVENOUS | Status: AC
  Filled 2016-09-03: qty 4

## 2016-09-03 MED ORDER — SUGAMMADEX SODIUM 200 MG/2ML IV SOLN
INTRAVENOUS | Status: DC | PRN
  Administered 2016-09-03: 400 mg via INTRAVENOUS

## 2016-09-03 MED ORDER — DIPHENHYDRAMINE HCL 50 MG/ML IJ SOLN: 12.5000 mg | Freq: Four times a day (QID) | INTRAMUSCULAR | Status: DC | PRN

## 2016-09-03 MED ORDER — NALOXONE HCL 0.4 MG/ML IJ SOLN: 0.4000 mg | INTRAMUSCULAR | Status: DC | PRN

## 2016-09-03 MED ORDER — MORPHINE SULFATE 2 MG/ML IV SOLN
INTRAVENOUS | Status: DC
  Administered 2016-09-03: 1.5 mg via INTRAVENOUS
  Administered 2016-09-03: 0 mg via INTRAVENOUS
  Administered 2016-09-03: 11:00:00 via INTRAVENOUS
  Administered 2016-09-04: 0 mg via INTRAVENOUS
  Administered 2016-09-04: 1.5 mg via INTRAVENOUS
  Administered 2016-09-04 (×3): 0 mg via INTRAVENOUS

## 2016-09-03 MED ORDER — INSULIN ASPART 100 UNIT/ML ~~LOC~~ SOLN: 0.0000 [IU] | SUBCUTANEOUS | Status: DC

## 2016-09-03 MED ORDER — MIDAZOLAM HCL 5 MG/5ML IJ SOLN
INTRAMUSCULAR | Status: DC | PRN
  Administered 2016-09-03: 2 mg via INTRAVENOUS

## 2016-09-03 MED ORDER — IRBESARTAN 300 MG PO TABS
300.0000 mg | ORAL_TABLET | Freq: Every day | ORAL | Status: DC
Start: 2016-09-03 — End: 2016-09-05
  Administered 2016-09-03 – 2016-09-05 (×3): 300 mg via ORAL
  Filled 2016-09-03 (×3): qty 1

## 2016-09-03 MED ORDER — ACETAMINOPHEN 325 MG PO TABS
650.0000 mg | ORAL_TABLET | Freq: Four times a day (QID) | ORAL | Status: DC | PRN
Start: 2016-09-03 — End: 2016-09-05

## 2016-09-03 MED ORDER — BUPIVACAINE HCL (PF) 0.25 % IJ SOLN
INTRAMUSCULAR | Status: AC
  Filled 2016-09-03: qty 30

## 2016-09-03 MED ORDER — DIPHENHYDRAMINE HCL 12.5 MG/5ML PO ELIX: 12.5000 mg | ORAL_SOLUTION | Freq: Four times a day (QID) | ORAL | Status: DC | PRN

## 2016-09-03 MED ORDER — ROCURONIUM BROMIDE 10 MG/ML (PF) SYRINGE
PREFILLED_SYRINGE | INTRAVENOUS | Status: AC
  Filled 2016-09-03: qty 5

## 2016-09-03 MED ORDER — ONDANSETRON 4 MG PO TBDP: 4.0000 mg | ORAL_TABLET | Freq: Four times a day (QID) | ORAL | Status: DC | PRN

## 2016-09-03 MED ORDER — FENTANYL CITRATE (PF) 100 MCG/2ML IJ SOLN
INTRAMUSCULAR | Status: AC
  Filled 2016-09-03: qty 2

## 2016-09-03 MED ORDER — PANTOPRAZOLE SODIUM 40 MG IV SOLR
40.0000 mg | Freq: Every day | INTRAVENOUS | Status: DC
  Administered 2016-09-03: 40 mg via INTRAVENOUS
  Filled 2016-09-03: qty 40

## 2016-09-03 MED ORDER — BUPIVACAINE HCL (PF) 0.25 % IJ SOLN
INTRAMUSCULAR | Status: DC | PRN
  Administered 2016-09-03: 25 mL

## 2016-09-03 MED ORDER — INSULIN PUMP
Freq: Three times a day (TID) | SUBCUTANEOUS | Status: DC
  Administered 2016-09-03: 18:00:00 via SUBCUTANEOUS
  Administered 2016-09-04: 0.1 via SUBCUTANEOUS
  Administered 2016-09-04: 10 via SUBCUTANEOUS
  Administered 2016-09-05: 08:00:00 via SUBCUTANEOUS
  Filled 2016-09-03: qty 1

## 2016-09-03 MED ORDER — POTASSIUM CHLORIDE IN NACL 20-0.45 MEQ/L-% IV SOLN
INTRAVENOUS | Status: DC
  Administered 2016-09-03 – 2016-09-04 (×2): via INTRAVENOUS
  Filled 2016-09-03 (×4): qty 1000

## 2016-09-03 MED ORDER — ONDANSETRON HCL 4 MG/2ML IJ SOLN
INTRAMUSCULAR | Status: DC | PRN
  Administered 2016-09-03: 4 mg via INTRAVENOUS

## 2016-09-03 MED ORDER — INDAPAMIDE 2.5 MG PO TABS
2.5000 mg | ORAL_TABLET | Freq: Every day | ORAL | Status: DC
  Administered 2016-09-03 – 2016-09-05 (×3): 2.5 mg via ORAL
  Filled 2016-09-03 (×3): qty 1

## 2016-09-03 MED ORDER — MORPHINE SULFATE 2 MG/ML IV SOLN
INTRAVENOUS | Status: AC
  Filled 2016-09-03: qty 30

## 2016-09-03 MED ORDER — ONDANSETRON HCL 4 MG/2ML IJ SOLN
INTRAMUSCULAR | Status: AC
  Filled 2016-09-03: qty 2

## 2016-09-03 MED ORDER — LACTATED RINGERS IV SOLN
INTRAVENOUS | Status: DC | PRN
  Administered 2016-09-03 (×3): via INTRAVENOUS

## 2016-09-03 MED ORDER — FENTANYL CITRATE (PF) 100 MCG/2ML IJ SOLN
INTRAMUSCULAR | Status: DC | PRN
  Administered 2016-09-03: 100 ug via INTRAVENOUS
  Administered 2016-09-03: 50 ug via INTRAVENOUS
  Administered 2016-09-03: 100 ug via INTRAVENOUS
  Administered 2016-09-03 (×2): 50 ug via INTRAVENOUS
  Administered 2016-09-03: 150 ug via INTRAVENOUS

## 2016-09-03 MED ORDER — ROCURONIUM BROMIDE 100 MG/10ML IV SOLN
INTRAVENOUS | Status: DC | PRN
  Administered 2016-09-03: 20 mg via INTRAVENOUS
  Administered 2016-09-03: 50 mg via INTRAVENOUS
  Administered 2016-09-03: 10 mg via INTRAVENOUS
  Administered 2016-09-03: 20 mg via INTRAVENOUS

## 2016-09-03 MED ORDER — EPHEDRINE SULFATE 50 MG/ML IJ SOLN
INTRAMUSCULAR | Status: DC | PRN
  Administered 2016-09-03 (×2): 5 mg via INTRAVENOUS

## 2016-09-03 MED ORDER — PANCRELIPASE (LIP-PROT-AMYL) 12000-38000 UNITS PO CPEP
72000.0000 [IU] | ORAL_CAPSULE | ORAL | Status: DC
  Filled 2016-09-03: qty 6

## 2016-09-03 MED ORDER — ONDANSETRON HCL 4 MG/2ML IJ SOLN: 4.0000 mg | Freq: Four times a day (QID) | INTRAMUSCULAR | Status: DC | PRN

## 2016-09-03 MED ORDER — NEBIVOLOL HCL 10 MG PO TABS
10.0000 mg | ORAL_TABLET | Freq: Every day | ORAL | Status: DC
  Administered 2016-09-04 – 2016-09-05 (×2): 10 mg via ORAL
  Filled 2016-09-03 (×2): qty 1

## 2016-09-03 MED ORDER — LIDOCAINE 2% (20 MG/ML) 5 ML SYRINGE
INTRAMUSCULAR | Status: AC
  Filled 2016-09-03: qty 5

## 2016-09-03 MED ORDER — PROPOFOL 10 MG/ML IV BOLUS
INTRAVENOUS | Status: DC | PRN
  Administered 2016-09-03: 200 mg via INTRAVENOUS

## 2016-09-03 MED ORDER — SODIUM CHLORIDE 0.9% FLUSH: 9.0000 mL | INTRAVENOUS | Status: DC | PRN

## 2016-09-03 MED ORDER — EPHEDRINE 5 MG/ML INJ
INTRAVENOUS | Status: AC
  Filled 2016-09-03: qty 10

## 2016-09-03 MED ORDER — MIDAZOLAM HCL 2 MG/2ML IJ SOLN
INTRAMUSCULAR | Status: AC
  Filled 2016-09-03: qty 2

## 2016-09-03 MED ORDER — PROPOFOL 10 MG/ML IV BOLUS
INTRAVENOUS | Status: AC
  Filled 2016-09-03: qty 20

## 2016-09-03 MED ORDER — FENTANYL CITRATE (PF) 250 MCG/5ML IJ SOLN
INTRAMUSCULAR | Status: AC
  Filled 2016-09-03: qty 5

## 2016-09-03 MED ORDER — FENTANYL CITRATE (PF) 100 MCG/2ML IJ SOLN
25.0000 ug | INTRAMUSCULAR | Status: DC | PRN
  Administered 2016-09-03: 50 ug via INTRAVENOUS

## 2016-09-03 MED ORDER — ACETAMINOPHEN 650 MG RE SUPP: 650.0000 mg | Freq: Four times a day (QID) | RECTAL | Status: DC | PRN

## 2016-09-03 MED ORDER — LIDOCAINE HCL (CARDIAC) 20 MG/ML IV SOLN
INTRAVENOUS | Status: DC | PRN
  Administered 2016-09-03: 100 mg via INTRAVENOUS

## 2016-09-03 MED ORDER — 0.9 % SODIUM CHLORIDE (POUR BTL) OPTIME
TOPICAL | Status: DC | PRN
  Administered 2016-09-03: 1000 mL

## 2016-09-03 SURGICAL SUPPLY — 42 items
APPLIER CLIP LOGIC TI 5 (MISCELLANEOUS) IMPLANT
BINDER ABDOMINAL 12 ML 46-62 (SOFTGOODS) IMPLANT
CANISTER SUCT 3000ML PPV (MISCELLANEOUS) IMPLANT
CHLORAPREP W/TINT 26ML (MISCELLANEOUS) ×3 IMPLANT
COVER SURGICAL LIGHT HANDLE (MISCELLANEOUS) ×3 IMPLANT
DEVICE SECURE STRAP 25 ABSORB (INSTRUMENTS) ×9 IMPLANT
DEVICE TROCAR PUNCTURE CLOSURE (ENDOMECHANICALS) ×3 IMPLANT
ELECT REM PT RETURN 9FT ADLT (ELECTROSURGICAL) ×3
ELECTRODE REM PT RTRN 9FT ADLT (ELECTROSURGICAL) ×2 IMPLANT
GAUZE SPONGE 2X2 8PLY STRL LF (GAUZE/BANDAGES/DRESSINGS) IMPLANT
GLOVE BIOGEL PI IND STRL 6.5 (GLOVE) ×2 IMPLANT
GLOVE BIOGEL PI INDICATOR 6.5 (GLOVE) ×1
GLOVE ECLIPSE 6.0 STRL STRAW (GLOVE) ×3 IMPLANT
GLOVE SURG ORTHO 8.0 STRL STRW (GLOVE) ×3 IMPLANT
GLOVE SURG SS PI 7.5 STRL IVOR (GLOVE) ×3 IMPLANT
GOWN STRL REUS W/ TWL LRG LVL3 (GOWN DISPOSABLE) ×4 IMPLANT
GOWN STRL REUS W/ TWL XL LVL3 (GOWN DISPOSABLE) ×2 IMPLANT
GOWN STRL REUS W/TWL LRG LVL3 (GOWN DISPOSABLE) ×2
GOWN STRL REUS W/TWL XL LVL3 (GOWN DISPOSABLE) ×1
KIT BASIN OR (CUSTOM PROCEDURE TRAY) ×3 IMPLANT
KIT ROOM TURNOVER OR (KITS) ×3 IMPLANT
MARKER SKIN DUAL TIP RULER LAB (MISCELLANEOUS) ×3 IMPLANT
MESH VENTRALIGHT ST 6X8 (Mesh Specialty) ×1 IMPLANT
MESH VENTRLGHT ELLIPSE 8X6XMFL (Mesh Specialty) ×2 IMPLANT
NEEDLE SPNL 22GX3.5 QUINCKE BK (NEEDLE) ×3 IMPLANT
NS IRRIG 1000ML POUR BTL (IV SOLUTION) ×3 IMPLANT
PAD ARMBOARD 7.5X6 YLW CONV (MISCELLANEOUS) ×6 IMPLANT
SCISSORS LAP 5X35 DISP (ENDOMECHANICALS) ×3 IMPLANT
SET IRRIG TUBING LAPAROSCOPIC (IRRIGATION / IRRIGATOR) IMPLANT
SHEARS HARMONIC ACE PLUS 36CM (ENDOMECHANICALS) ×3 IMPLANT
SLEEVE ENDOPATH XCEL 5M (ENDOMECHANICALS) ×6 IMPLANT
SPONGE GAUZE 2X2 STER 10/PKG (GAUZE/BANDAGES/DRESSINGS)
STRIP CLOSURE SKIN 1/2X4 (GAUZE/BANDAGES/DRESSINGS) IMPLANT
SUT MNCRL AB 4-0 PS2 18 (SUTURE) ×3 IMPLANT
SUT NOVA NAB DX-16 0-1 5-0 T12 (SUTURE) ×3 IMPLANT
TOWEL OR 17X24 6PK STRL BLUE (TOWEL DISPOSABLE) ×3 IMPLANT
TOWEL OR 17X26 10 PK STRL BLUE (TOWEL DISPOSABLE) ×3 IMPLANT
TRAY FOLEY CATH SILVER 16FR (SET/KITS/TRAYS/PACK) ×3 IMPLANT
TRAY LAPAROSCOPIC MC (CUSTOM PROCEDURE TRAY) ×3 IMPLANT
TROCAR XCEL NON-BLD 11X100MML (ENDOMECHANICALS) ×3 IMPLANT
TROCAR XCEL NON-BLD 5MMX100MML (ENDOMECHANICALS) ×3 IMPLANT
TUBING INSUFFLATION (TUBING) ×3 IMPLANT

## 2016-09-03 NOTE — Progress Notes (Signed)
Pt. Pump in basal mode, Dr. Seward Speck is aware.

## 2016-09-03 NOTE — Brief Op Note (Signed)
09/03/2016  11:08 AM  PATIENT:  Robert Mercer  56 y.o. male  PRE-OPERATIVE DIAGNOSIS:  ventral incisional hernia  POST-OPERATIVE DIAGNOSIS:  ventral incisional hernia  PROCEDURE:  Procedure(s): LAPAROSCOPIC VENTRAL INCISIONAL  HERNIA REPAIR (N/A) INSERTION OF MESH (N/A)  SURGEON:  Surgeon(s) and Role:    * Armandina Gemma, MD - Primary  ANESTHESIA:   general  EBL:  Total I/O In: 2100 [I.V.:2100] Out: 150 [Urine:100; Blood:50]  BLOOD ADMINISTERED:none  DRAINS: none   LOCAL MEDICATIONS USED:  MARCAINE     SPECIMEN:  No Specimen  DISPOSITION OF SPECIMEN:  N/A  COUNTS:  YES  TOURNIQUET:  * No tourniquets in log *  DICTATION: .Other Dictation: Dictation Number [id number]  PLAN OF CARE: Admit to inpatient   PATIENT DISPOSITION:  PACU - hemodynamically stable.   Delay start of Pharmacological VTE agent (>24hrs) due to surgical blood loss or risk of bleeding: yes  Robert Regal, MD, [facility] Office: [phone]

## 2016-09-03 NOTE — Progress Notes (Signed)
Spoke w/ diabetic coordinator/ prefers pt turn on pummp, set at basal rate and use accordingl7y/ [id number] same with pt, set pump with his current cbg of 213 per pacu staff

## 2016-09-03 NOTE — Anesthesia Procedure Notes (Signed)
Procedure Name: Intubation Date/Time: 09/03/2016 7:37 AM Performed by: Manuela Schwartz B Pre-anesthesia Checklist: Patient identified, Emergency Drugs available, Suction available, Patient being monitored and Timeout performed Patient Re-evaluated:Patient Re-evaluated prior to inductionOxygen Delivery Method: Circle system utilized Preoxygenation: Pre-oxygenation with 100% oxygen Intubation Type: IV induction Ventilation: Mask ventilation without difficulty Laryngoscope Size: Mac and 3 Grade View: Grade III Tube type: Oral Tube size: 7.5 mm Number of attempts: 1 Airway Equipment and Method: Stylet Placement Confirmation: ETT inserted through vocal cords under direct vision,  positive ETCO2 and breath sounds checked- equal and bilateral Secured at: 23 cm Tube secured with: Tape Dental Injury: Teeth and Oropharynx as per pre-operative assessment

## 2016-09-03 NOTE — Op Note (Signed)
Robert Mercer, [place]                  ACCOUNT NO.:  [id number]  MEDICAL RECORD NO.:  [id number]  LOCATION:  6N01C                        FACILITY:  [place]  PHYSICIAN:  Earnstine Regal, MD      DATE OF BIRTH:  [birth year]  DATE OF PROCEDURE:  09/03/2016                              OPERATIVE REPORT   PREOPERATIVE DIAGNOSIS:  Ventral incisional hernia.  POSTOPERATIVE DIAGNOSIS:  Ventral incisional hernia.  PROCEDURE:  1. Laparoscopic ventral incisional hernia repair with Ventralight ST mesh (15 x 20 cm);  2. Laparoscopic lysis of adhesions (1 hour)  SURGEON:  Earnstine Regal, MD  ANESTHESIA:  General per Dr. Lyndle Herrlich.  ESTIMATED BLOOD LOSS:  Minimal.  PREPARATION:  ChloraPrep.  COMPLICATIONS:  None.  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  Postoperative course was complicated by wound infection, requiring open dressing changes and healing by secondary intention.  The patient also was treated with adjuvant chemotherapy and radiation therapy.  The patient developed a ventral incisional hernia, which was gradually increased in size over the years.  It causes intermittent discomfort.  The patient has had no signs or symptoms of obstruction.  He now comes to Surgery for repair of ventral incisional hernia.  BODY OF REPORT:  Procedure was done in OR #9 at the [place].  The patient was brought to the operating room, placed in supine position on the operating room table.  Following administration of general anesthesia, the patient was positioned and then prepped and draped in the usual aseptic fashion.  After ascertaining that an adequate level of anesthesia had been achieved, an incision was made in the left upper quadrant near the costal margin. Using a 5-mm Optiview trocar, the laparoscope was introduced  under direct vision into the peritoneal cavity.  Pneumoperitoneum is established.  Visualization shows numerous adhesions of omentum and small bowel to the anterior abdominal wall.  The scope was advanced along the left colic gutter and a second port was placed in the left lower quadrant under direct vision.  Using this port to visualize the abdomen again, there are noted multiple adhesions of small bowel to the anterior abdominal wall to the midline incision and small bowel going into the hernia defect in the upper midline.  Two additional operative ports were placed in the right lower quadrant and left lower quadrant under direct vision.  With some difficulty due to limited operating space and numerous adhesions, a lysis of adhesion was carried out for a little over 1 hour of time.  Two additional ports were placed; one in the suprapubic region and a second port in the left upper quadrant.  With diligence, the lysis of adhesions was successfully carried out and the small bowel was mobilized away from the anterior abdominal wall without noted complication.  Hernia was reduced.  Hemostasis was achieved when necessary with the Harmonic scalpel.  Remaining adipose tissue was taken off the anterior abdominal wall, likely representing residual omentum. In this fashion, the entire anterior abdominal wall was cleared.  Hernia defect was assessed.  It is in the upper midline just to the left of midline.  It measured 6 x 6 cm in size.  Inspection of the remainder of the abdominal wall showed no other fascial defects.  After measurement, a 15-cm x 20-cm sheet of Ventralight ST mesh was selected. It was marked in the usual fashion on the abdominal wall.  #1 Novafil sutures were placed around the periphery of the mesh.  A total of eight sutures were used.  Mesh was moistened and rolled and inserted into the peritoneal cavity.  It was deployed and oriented properly.  Incisions were then made on  the skin with a #11 blade and using the EndoCatch, the suture tags were retrieved from all eight sutures.  Mesh was then elevated onto the anterior abdominal wall with broad coverage of the hernia defect.  All eight sutures were tied securely.  Using a SecureStrap device, two concentric rows of SecureStrap staples were placed to affix the mesh to the anterior abdominal wall.  There was good spread of the mesh over the anterior abdominal wall and good overlap of the hernia defect.  Good hemostasis was also noted.  Ports were then removed under direct vision and pneumoperitoneum was released.  All pneumoperitoneum that could possibly be removed from the peritoneal cavity was vented through the ports.  Port sites were inspected and hemostasis achieved with the electrocautery.  Local anesthetic was infiltrated.  Port sites were closed with interrupted 4-0 Monocryl sutures.  Wounds were washed and dried and Steri-Strips were applied.  Sterile dressings were applied.  Abdominal binder was applied. The patient was awakened from anesthesia and taken to the recovery room. The patient tolerated the procedure well.   Earnstine Regal, MD, [facility] Office: [phone]    TMG/MEDQ  D:  09/03/2016  T:  09/03/2016  Job:  [id number]  cc:   Juanda Bond. [place], M.D.

## 2016-09-03 NOTE — Interval H&P Note (Signed)
History and Physical Interval Note:  09/03/2016 6:55 AM  Margarette Canada  has presented today for surgery, with the diagnosis of ventral incisional hernia.  The various methods of treatment have been discussed with the patient and family. After consideration of risks, benefits and other options for treatment, the patient has consented to    Procedure(s): Hooks (N/A) INSERTION OF MESH (N/A) as a surgical intervention .    The patient's history has been reviewed, patient examined, no change in status, stable for surgery.  I have reviewed the patient's chart and labs.  Questions were answered to the patient's satisfaction.    Earnstine Regal, MD, [facility] Office: [place]

## 2016-09-03 NOTE — Progress Notes (Signed)
Patients night time CBG was 145, he will be giving himself 0 units of insulin.  Jimmie Molly, RN

## 2016-09-03 NOTE — Transfer of Care (Signed)
Immediate Anesthesia Transfer of Care Note  Patient: Robert Mercer  Procedure(s) Performed: Procedure(s): LAPAROSCOPIC VENTRAL INCISIONAL  HERNIA REPAIR (N/A) INSERTION OF MESH (N/A)  Patient Location: PACU  Anesthesia Type:General  Level of Consciousness: awake, alert  and oriented  Airway & Oxygen Therapy: Patient Spontanous Breathing  Post-op Assessment: Report given to RN and Post -op Vital signs reviewed and stable  Post vital signs: Reviewed and stable  Last Vitals:  Vitals:   09/03/16 0619  BP: 138/65  Pulse: (!) 55  Resp: 20  Temp: 36.7 C    Last Pain:  Vitals:   09/03/16 0619  TempSrc: Oral         Complications: No apparent anesthesia complications

## 2016-09-03 NOTE — Progress Notes (Signed)
Inpatient Diabetes Program Recommendations  AACE/ADA: New Consensus Statement on Inpatient Glycemic Control (2015)  Target Ranges:  Prepandial:   less than 140 mg/dL      Peak postprandial:   less than 180 mg/dL (1-2 hours)      Critically ill patients:  140 - 180 mg/dL   Patient to resume insulin pump operation while inpatient Per Dr. Harlow Asa.  Patient to advance diet tomorrow. Clear liquid diet for now.  NURSING: Print off the Patient insulin pump contract and flow sheet. The insulin pump contract should be signed by the patient and then placed in the chart. The patient insulin pump flow sheet will be completed by the patient at the bedside and the RN caring for the patient will use the patient's flow sheet to document in the [facility]. RN will need to complete the Nursing Insulin Pump Flowsheet at least once a shift. Patient will need to keep extra insulin pump supplies at the bedside at all times.   Thanks,  Tama Headings RN, MSN, [facility] Inpatient Diabetes Coordinator Team Pager [phone] (8a-5p)

## 2016-09-04 ENCOUNTER — Encounter (HOSPITAL_COMMUNITY): Payer: Self-pay | Admitting: Surgery

## 2016-09-04 LAB — GLUCOSE, CAPILLARY
GLUCOSE-CAPILLARY: 75 mg/dL (ref 65–99)
GLUCOSE-CAPILLARY: 145 mg/dL — AB (ref 65–99)
Glucose-Capillary: 167 mg/dL — ABNORMAL HIGH (ref 65–99)
Glucose-Capillary: 167 mg/dL — ABNORMAL HIGH (ref 65–99)

## 2016-09-04 MED ORDER — HYDROCODONE-ACETAMINOPHEN 5-325 MG PO TABS: 1.0000 | ORAL_TABLET | ORAL | Status: DC | PRN

## 2016-09-04 MED ORDER — PANTOPRAZOLE SODIUM 40 MG PO TBEC
40.0000 mg | DELAYED_RELEASE_TABLET | Freq: Every day | ORAL | Status: DC
  Administered 2016-09-04: 40 mg via ORAL
  Filled 2016-09-04: qty 1

## 2016-09-04 MED ORDER — KETOROLAC TROMETHAMINE 15 MG/ML IJ SOLN
15.0000 mg | Freq: Four times a day (QID) | INTRAMUSCULAR | Status: AC
  Administered 2016-09-04 – 2016-09-05 (×5): 15 mg via INTRAVENOUS
  Filled 2016-09-04 (×5): qty 1

## 2016-09-04 NOTE — Progress Notes (Signed)
  General Surgery [facility]  Assessment & Plan:  POD#1 - status post lap ventral incisional hernia repair with mesh, lysis of adhesions  Pain control adequate - PCA, will add Toradol  OOB, ambulate  Discontinue Foley  Tolerating liquids - advance to regular diet IDDM  Patient managing with pump  Will give regular diet and allow patient to select and manage        Earnstine Regal, MD, [facility]       Office: [phone]    Subjective: Patient in bed, doing OK.  Some pain - has not used PCA much.  No nausea or emesis.  Objective: Vital signs in last 24 hours: Temp:  [97.8 F (36.6 C)-98.2 F (36.8 C)] 98.2 F (36.8 C) (04/24 0636) Pulse Rate:  [58-80] 78 (04/24 0636) Resp:  [9-20] 20 (04/24 0358) BP: (112-168)/(53-80) 168/80 (04/24 0636) SpO2:  [96 %-99 %] 98 % (04/24 0636) Weight:  [95.2 kg (209 lb 14.1 oz)] 95.2 kg (209 lb 14.1 oz) (04/23 1200) Last BM Date: 09/03/16  Intake/Output from previous day: 04/23 0701 - 04/24 0700 In: 4031.3 [P.O.:710; I.V.:3321.3] Out: 1275 [Urine:1225; Blood:50] Intake/Output this shift: No intake/output data recorded.  Physical Exam: HEENT - sclerae clear, mucous membranes moist Neck - soft Abdomen - soft without distension; wounds dry and intact; binder replaced Ext - no edema, non-tender Neuro - alert & oriented, no focal deficits  Lab Results:   Recent Labs  09/03/16 0654  HGB 13.6  HCT 40.0   BMET  Recent Labs  09/03/16 0654  NA 129*  K 3.7  GLUCOSE 118*   PT/INR No results for input(s): LABPROT, INR in the last 72 hours. Comprehensive Metabolic Panel:    Component Value Date/Time   NA 129 (L) 09/03/2016 0654   NA 129 (L) 08/24/2016 1520   NA 142 07/16/2012 1124   K 3.7 09/03/2016 0654   K 3.8 08/24/2016 1520   K 4.2 07/16/2012 1124   CL 91 (L) 08/24/2016 1520   CL 98 07/23/2012 1100   CL 106 07/16/2012 1124   CO2 26 08/24/2016 1520   CO2 27  07/23/2012 1100   CO2 27 07/16/2012 1124   BUN 11 08/24/2016 1520   BUN 11 07/23/2012 1100   BUN 14.7 07/16/2012 1124   CREATININE 1.21 08/24/2016 1520   CREATININE 0.79 07/23/2012 1100   CREATININE 1.0 07/16/2012 1124   GLUCOSE 118 (H) 09/03/2016 0654   GLUCOSE 139 (H) 08/24/2016 1520   GLUCOSE 76 07/16/2012 1124   CALCIUM 9.0 08/24/2016 1520   CALCIUM 8.7 07/23/2012 1100   CALCIUM 9.4 07/16/2012 1124   AST 78 (H) 07/23/2012 1100   AST 173 (H) 07/16/2012 1124   ALT 110 (H) 07/23/2012 1100   ALT 156 (H) 07/16/2012 1124   ALKPHOS 578 (H) 07/23/2012 1100   ALKPHOS 715 (H) 07/16/2012 1124   BILITOT 6.7 (H) 07/23/2012 1100   BILITOT 2.52 (H) 07/16/2012 1124   PROT 6.3 07/23/2012 1100   PROT 7.0 07/16/2012 1124   ALBUMIN 2.6 (L) 07/23/2012 1100   ALBUMIN 3.0 (L) 07/16/2012 1124    Studies/Results: No results found.    Robert Mercer 09/04/2016  Patient ID: Robert Mercer, male   DOB: Dec 09, 1960, 56 y.o.   MRN: [id number]

## 2016-09-04 NOTE — Care Management Note (Signed)
Case Management Note  Patient Details  Name: ELEUTERIO DOLLAR MRN: [id number] Date of Birth: [birth year]  Subjective/Objective:                    Action/Plan:  4-23 lap ventral hernia repair HX Whipple in 2003, Pt is oral surgeon here  Will continue to follow for discharge needs Expected Discharge Date:                  Expected Discharge Plan:  Home/Self Care  In-House Referral:     Discharge planning Services     Post Acute Care Choice:    Choice offered to:     DME Arranged:    DME Agency:     HH Arranged:    [place] Agency:     Status of Service:  In process, will continue to follow  If discussed at Long Length of Stay Meetings, dates discussed:    Additional Comments:  Marilu Favre, RN 09/04/2016, 9:46 AM

## 2016-09-04 NOTE — Anesthesia Postprocedure Evaluation (Signed)
Anesthesia Post Note  Patient: Robert Mercer  Procedure(s) Performed: Procedure(s) (LRB): LAPAROSCOPIC VENTRAL INCISIONAL  HERNIA REPAIR (N/A) INSERTION OF MESH (N/A)  Patient location during evaluation: PACU Anesthesia Type: General Level of consciousness: awake and alert Pain management: pain level controlled Vital Signs Assessment: post-procedure vital signs reviewed and stable Respiratory status: spontaneous breathing, nonlabored ventilation, respiratory function stable and patient connected to nasal cannula oxygen Cardiovascular status: blood pressure returned to baseline and stable Postop Assessment: no signs of nausea or vomiting Anesthetic complications: no       Last Vitals:  Vitals:   09/04/16 1141 09/04/16 1408  BP: (!) 122/55 (!) 147/78  Pulse: 69 74  Resp: 18 18  Temp: 36.9 C 36.7 C    Last Pain:  Vitals:   09/04/16 1408  TempSrc: Oral  PainSc:                  Riccardo Dubin

## 2016-09-04 NOTE — Progress Notes (Signed)
Inpatient Diabetes Program Recommendations  AACE/ADA: New Consensus Statement on Inpatient Glycemic Control (2015)  Target Ranges:  Prepandial:   less than 140 mg/dL      Peak postprandial:   less than 180 mg/dL (1-2 hours)      Critically ill patients:  140 - 180 mg/dL   Results for Robert Mercer, Robert Mercer (MRN [id number]) as of 09/04/2016 11:54  Ref. Range 09/03/2016 06:22 09/03/2016 08:26 09/03/2016 09:22 09/03/2016 10:00 09/03/2016 10:47 09/03/2016 12:24 09/03/2016 17:13 09/03/2016 21:35 09/04/2016 08:12 09/04/2016 11:44  Glucose-Capillary Latest Ref Range: 65 - 99 mg/dL 124 (H) 85 145 (H) 169 (H) 213 (H) 243 (H) 256 (H) 145 (H) 75 167 (H)    Admit with: Hernia Repair  History: DM Type 1 since age 32  Home DM Meds: Insulin Pump  Current Insulin Orders: Insulin Pump      Spoke with patient this AM.  Patient A&O and able to independently manage insulin pump.  Per patient, he suspended his insulin pump during surgery but restarted the pump back while in PACU.  CBG this AM: 75 mg/dl.  Has extra insulin pump supplies at bedside.  Changed set/site/reservoir on Saturday, April 21.    --Insulin Pump Settings--  Basal Rates: 12am- 0.5 units/hr   3am- 1.7 units/hr   5:30am- 1 unit/hr  Total Basal Insulin per 24 hours period= 24.25 units  Carbohydrate Ratio: 1 unit for every 8 Grams of Carbohydrates  Correction/Sensitivity Factor: 1 unit for every 40 mg/dl above Target CBG  Target CBG: 100 mg/dl     --Will follow patient during hospitalization--  Wyn Quaker RN, MSN, CDE Diabetes Coordinator Inpatient Glycemic Control Team Team Pager: [phone] (8a-5p)

## 2016-09-04 NOTE — Progress Notes (Signed)
General Surgery [facility]  Patient seen and examined.  Doing well today.  Ambulated.  Tolerating regular diet.  Not using morphine PCA.  Will discontinue PCA and allow Norco.  Patient has not voided - nurse to bladder scan and I&O cath if unable to void.  Overall, doing very well post op.  tmg  Earnstine Regal, MD, [facility] Office: [phone]

## 2016-09-05 LAB — GLUCOSE, CAPILLARY: Glucose-Capillary: 156 mg/dL — ABNORMAL HIGH (ref 65–99)

## 2016-09-05 MED ORDER — HYDROCODONE-ACETAMINOPHEN 5-325 MG PO TABS: 1.0000 | ORAL_TABLET | ORAL | 0 refills | Status: DC | PRN

## 2016-09-05 NOTE — Discharge Summary (Signed)
Physician Discharge Summary [facility]  Patient ID: Robert Mercer MRN: [id number] DOB/AGE: [birth year] 56 y.o.  Admit date: 09/03/2016 Discharge date: 09/05/2016  Admission Diagnoses:  Ventral incisional hernia  Discharge Diagnoses:  Principal Problem:   Incisional hernia, without obstruction or gangrene Active Problems:   Ventral incisional hernia   Discharged Condition: good  Hospital Course: Patient was admitted for observation following laparoscopic ventral incisional hernia surgery.  Post op course was uncomplicated.  Pain was well controlled.  Tolerated diet.  Patient was prepared for discharge home on POD#2.  Consults: None  Treatments: surgery:  laparoscopic ventral incisional hernia repair with mesh  Discharge Exam: Blood pressure (!) 149/67, pulse 62, temperature 97.7 F (36.5 C), temperature source Oral, resp. rate 18, height 5\' 9"  (1.753 m), weight 95.2 kg (209 lb 14.1 oz), SpO2 98 %. HEENT - clear Neck - soft Abd - wounds dry and intact; mild STS at hernia site; binder replaced  Disposition: Home  Discharge Instructions    Diet - low sodium heart healthy    Complete by:  As directed    Discharge instructions    Complete by:  As directed    [facility]  LAPAROSCOPIC SURGERY:  POST-OP INSTRUCTIONS  Always review your discharge instruction sheet given to you by the facility where your surgery was performed.  A prescription for pain medication may be given to you upon discharge.  Take your pain medication as prescribed.  If narcotic pain medicine is not needed, then you may take acetaminophen (Tylenol) or ibuprofen (Advil) as needed.  Take your usually prescribed medications unless otherwise directed.  If you need a refill on your pain medication, please contact your pharmacy.  They will contact our office to request authorization. Prescriptions will not be filled after 5 P.M. or on weekends.  You should follow a light  diet the first few days after arrival home, such as soup and crackers or toast.  Be sure to include plenty of fluids daily.  Most patients will experience some swelling and bruising in the area of the incisions.  Ice packs will help.  Swelling and bruising can take several days to resolve.   It is common to experience some constipation after surgery.  Increasing fluid intake and taking a stool softener (such as Colace) will usually help or prevent this problem from occurring.  A mild laxative (Milk of Magnesia or Miralax) should be taken according to package instructions if there has been no bowel movement after 48 hours.  You will have steri-strips and a gauze dressing over your incisions.  You may remove the gauze bandage on the second day after surgery, and you may shower at that time.  Leave your steri-strips (small skin tapes) in place directly over the incision.  These strips should remain on the skin for 5-7 days and then be removed.  You may get them wet in the shower and pat them dry.  Any sutures or staples will be removed at the office during your follow-up visit.  ACTIVITIES:  You may resume regular (light) daily activities beginning the next day - such as daily self-care, walking, climbing stairs - gradually increasing activities as tolerated.  You may have sexual intercourse when it is comfortable.  Refrain from any heavy lifting or straining until approved by your doctor.  You may drive when you are no longer taking prescription pain medication, you can comfortably wear a seatbelt, and you can safely maneuver your car and apply  brakes.  You should see your doctor in the office for a follow-up appointment approximately 2-3 weeks after your surgery.  Make sure that you call for this appointment within a day or two after you arrive home to insure a convenient appointment time.  WHEN TO CALL YOUR DOCTOR: Fever over 101.0 Inability to urinate Continued bleeding from incision Increased  pain, redness, or drainage from the incision Increasing abdominal pain  The clinic staff is available to answer your questions during regular business hours.  Please don't hesitate to call and ask to speak to one of the nurses for clinical concerns.  If you have a medical emergency, go to the nearest emergency room or call 911.  A surgeon from [facility] is always on call for the hospital.  Earnstine Regal, MD, [facility] Office: [place] Free:  [place] [phone]  Website: [email or web address]   Discharge wound care:    Complete by:  As directed    May remove gauze dressings today and shower.  Wear binder all of the time for next three weeks except to shower and for short breaks while resting.  Leave steristrips in place 7-10 days and remove when they come loose.   Increase activity slowly    Complete by:  As directed      Allergies as of 09/05/2016      Reactions   Azilsartan Other (See Comments)   Possibly caused hives (as Edarbyclor) on 06/16/14   Chlorthalidone Other (See Comments)   Possibly caused hives (as Edarbyclor) on 06/16/14   Shellfish Allergy    Amlodipine Hives, Itching, Rash   Lisinopril Hives, Itching, Rash      Medication List    TAKE these medications   aspirin 81 MG tablet Take 81 mg by mouth daily.   BYSTOLIC 10 MG tablet Generic drug:  nebivolol Take 10 mg by mouth daily.   CREON 24000-76000 units Cpep Generic drug:  Pancrelipase (Lip-Prot-Amyl) Take 5-6 capsules by mouth 3 (three) times daily with meals. Take 3 capsules with snacks   cyanocobalamin 1000 MCG/ML injection Commonly known as:  (VITAMIN B-12) Inject 1,000 mcg into the muscle every 30 (thirty) days.   esomeprazole 20 MG capsule Commonly known as:  NEXIUM Take 20 mg by mouth daily at 12 noon.   glucosamine-chondroitin 500-400 MG tablet Take 1 tablet by mouth daily.   HYDROcodone-acetaminophen 5-325 MG  tablet Commonly known as:  NORCO/VICODIN Take 1-2 tablets by mouth every 4 (four) hours as needed for moderate pain.   indapamide 2.5 MG tablet Commonly known as:  LOZOL Take 2.5 mg by mouth daily.   NOVOLOG 100 UNIT/ML injection Generic drug:  insulin aspart Mini med insulin pump   valsartan 320 MG tablet Commonly known as:  DIOVAN Take 320 mg by mouth daily.   Vitamin D (Ergocalciferol) 50000 units Caps capsule Commonly known as:  DRISDOL Take 50,000 Units by mouth every Monday.      Follow-up Information    Tamarius Rosenfield M, MD. Schedule an appointment as soon as possible for a visit in 2 week(s).   Specialty:  General Surgery Contact information: [address] [place] [address] [phone]           Earnstine Regal, MD, [facility] Office: [phone]   Signed: Earnstine Regal 09/05/2016, 8:14 AM

## 2016-09-05 NOTE — Progress Notes (Signed)
Robert Mercer to be D/C'd  per MD order. Discussed with the patient and all questions fully answered.  VSS, Skin clean, dry and intact without evidence of skin break down, no evidence of skin tears noted.  IV catheter discontinued intact. Site without signs and symptoms of complications. Dressing and pressure applied.  An After Visit Summary was printed and given to the patient. Patient received prescription.  D/c education completed with patient/family including follow up instructions, medication list, d/c activities limitations if indicated, with other d/c instructions as indicated by MD - patient able to verbalize understanding, all questions fully answered.   Patient instructed to return to ED, call 911, or call MD for any changes in condition.   Patient to be escorted via [place], and D/C home via private auto.

## 2016-11-23 NOTE — Anesthesia Postprocedure Evaluation (Signed)
Anesthesia Post Note  Patient: Margarette Canada  Procedure(s) Performed: Procedure(s) (LRB): LAPAROSCOPIC VENTRAL INCISIONAL  HERNIA REPAIR (N/A) INSERTION OF MESH (N/A)     Anesthesia Post Evaluation  Last Vitals:  Vitals:   09/05/16 0216 09/05/16 0516  BP: (!) 158/60 (!) 149/67  Pulse: (!) 54 62  Resp: 18 18  Temp: 36.4 C 36.5 C    Last Pain:  Vitals:   09/05/16 0516  TempSrc: Oral  PainSc:                  Riccardo Dubin

## 2016-11-23 NOTE — Addendum Note (Signed)
Addendum  created 11/23/16 1134 by Lyndle Herrlich, MD   Sign clinical note

## 2019-06-02 DIAGNOSIS — E109 Type 1 diabetes mellitus without complications: Secondary | ICD-10-CM | POA: Diagnosis not present

## 2019-06-02 DIAGNOSIS — Z9641 Presence of insulin pump (external) (internal): Secondary | ICD-10-CM | POA: Diagnosis not present

## 2019-06-02 DIAGNOSIS — E1065 Type 1 diabetes mellitus with hyperglycemia: Secondary | ICD-10-CM | POA: Diagnosis not present

## 2019-06-03 DIAGNOSIS — Z9641 Presence of insulin pump (external) (internal): Secondary | ICD-10-CM | POA: Diagnosis not present

## 2019-06-03 DIAGNOSIS — E1065 Type 1 diabetes mellitus with hyperglycemia: Secondary | ICD-10-CM | POA: Diagnosis not present

## 2019-06-03 DIAGNOSIS — E109 Type 1 diabetes mellitus without complications: Secondary | ICD-10-CM | POA: Diagnosis not present

## 2019-06-22 DIAGNOSIS — E782 Mixed hyperlipidemia: Secondary | ICD-10-CM | POA: Diagnosis not present

## 2019-06-22 DIAGNOSIS — Z125 Encounter for screening for malignant neoplasm of prostate: Secondary | ICD-10-CM | POA: Diagnosis not present

## 2019-06-22 DIAGNOSIS — E103293 Type 1 diabetes mellitus with mild nonproliferative diabetic retinopathy without macular edema, bilateral: Secondary | ICD-10-CM | POA: Diagnosis not present

## 2019-06-25 DIAGNOSIS — I10 Essential (primary) hypertension: Secondary | ICD-10-CM | POA: Diagnosis not present

## 2019-06-25 DIAGNOSIS — E782 Mixed hyperlipidemia: Secondary | ICD-10-CM | POA: Diagnosis not present

## 2019-06-25 DIAGNOSIS — Z9641 Presence of insulin pump (external) (internal): Secondary | ICD-10-CM | POA: Diagnosis not present

## 2019-06-25 DIAGNOSIS — E103293 Type 1 diabetes mellitus with mild nonproliferative diabetic retinopathy without macular edema, bilateral: Secondary | ICD-10-CM | POA: Diagnosis not present

## 2019-07-03 DIAGNOSIS — Z9641 Presence of insulin pump (external) (internal): Secondary | ICD-10-CM | POA: Diagnosis not present

## 2019-07-03 DIAGNOSIS — E1065 Type 1 diabetes mellitus with hyperglycemia: Secondary | ICD-10-CM | POA: Diagnosis not present

## 2019-07-03 DIAGNOSIS — E109 Type 1 diabetes mellitus without complications: Secondary | ICD-10-CM | POA: Diagnosis not present

## 2019-07-09 DIAGNOSIS — E119 Type 2 diabetes mellitus without complications: Secondary | ICD-10-CM | POA: Diagnosis not present

## 2019-07-09 DIAGNOSIS — H5203 Hypermetropia, bilateral: Secondary | ICD-10-CM | POA: Diagnosis not present

## 2019-07-31 DIAGNOSIS — E109 Type 1 diabetes mellitus without complications: Secondary | ICD-10-CM | POA: Diagnosis not present

## 2019-07-31 DIAGNOSIS — Z9641 Presence of insulin pump (external) (internal): Secondary | ICD-10-CM | POA: Diagnosis not present

## 2019-07-31 DIAGNOSIS — E1065 Type 1 diabetes mellitus with hyperglycemia: Secondary | ICD-10-CM | POA: Diagnosis not present

## 2019-08-31 DIAGNOSIS — E1065 Type 1 diabetes mellitus with hyperglycemia: Secondary | ICD-10-CM | POA: Diagnosis not present

## 2019-08-31 DIAGNOSIS — E109 Type 1 diabetes mellitus without complications: Secondary | ICD-10-CM | POA: Diagnosis not present

## 2019-08-31 DIAGNOSIS — Z9641 Presence of insulin pump (external) (internal): Secondary | ICD-10-CM | POA: Diagnosis not present

## 2019-09-01 DIAGNOSIS — Z9641 Presence of insulin pump (external) (internal): Secondary | ICD-10-CM | POA: Diagnosis not present

## 2019-09-01 DIAGNOSIS — E109 Type 1 diabetes mellitus without complications: Secondary | ICD-10-CM | POA: Diagnosis not present

## 2019-09-01 DIAGNOSIS — E1065 Type 1 diabetes mellitus with hyperglycemia: Secondary | ICD-10-CM | POA: Diagnosis not present

## 2019-09-14 DIAGNOSIS — E103293 Type 1 diabetes mellitus with mild nonproliferative diabetic retinopathy without macular edema, bilateral: Secondary | ICD-10-CM | POA: Diagnosis not present

## 2019-09-14 DIAGNOSIS — E782 Mixed hyperlipidemia: Secondary | ICD-10-CM | POA: Diagnosis not present

## 2019-09-24 DIAGNOSIS — Z794 Long term (current) use of insulin: Secondary | ICD-10-CM | POA: Diagnosis not present

## 2019-09-24 DIAGNOSIS — E103293 Type 1 diabetes mellitus with mild nonproliferative diabetic retinopathy without macular edema, bilateral: Secondary | ICD-10-CM | POA: Diagnosis not present

## 2019-09-24 DIAGNOSIS — Z9641 Presence of insulin pump (external) (internal): Secondary | ICD-10-CM | POA: Diagnosis not present

## 2019-09-24 DIAGNOSIS — E782 Mixed hyperlipidemia: Secondary | ICD-10-CM | POA: Diagnosis not present

## 2019-09-30 DIAGNOSIS — E109 Type 1 diabetes mellitus without complications: Secondary | ICD-10-CM | POA: Diagnosis not present

## 2019-09-30 DIAGNOSIS — Z9641 Presence of insulin pump (external) (internal): Secondary | ICD-10-CM | POA: Diagnosis not present

## 2019-09-30 DIAGNOSIS — E1065 Type 1 diabetes mellitus with hyperglycemia: Secondary | ICD-10-CM | POA: Diagnosis not present

## 2019-11-02 DIAGNOSIS — E109 Type 1 diabetes mellitus without complications: Secondary | ICD-10-CM | POA: Diagnosis not present

## 2019-11-02 DIAGNOSIS — E1065 Type 1 diabetes mellitus with hyperglycemia: Secondary | ICD-10-CM | POA: Diagnosis not present

## 2019-11-02 DIAGNOSIS — Z9641 Presence of insulin pump (external) (internal): Secondary | ICD-10-CM | POA: Diagnosis not present

## 2019-12-01 DIAGNOSIS — E109 Type 1 diabetes mellitus without complications: Secondary | ICD-10-CM | POA: Diagnosis not present

## 2019-12-01 DIAGNOSIS — E1065 Type 1 diabetes mellitus with hyperglycemia: Secondary | ICD-10-CM | POA: Diagnosis not present

## 2019-12-01 DIAGNOSIS — Z9641 Presence of insulin pump (external) (internal): Secondary | ICD-10-CM | POA: Diagnosis not present

## 2019-12-02 DIAGNOSIS — E1065 Type 1 diabetes mellitus with hyperglycemia: Secondary | ICD-10-CM | POA: Diagnosis not present

## 2019-12-02 DIAGNOSIS — E109 Type 1 diabetes mellitus without complications: Secondary | ICD-10-CM | POA: Diagnosis not present

## 2019-12-02 DIAGNOSIS — Z9641 Presence of insulin pump (external) (internal): Secondary | ICD-10-CM | POA: Diagnosis not present

## 2019-12-22 DIAGNOSIS — E782 Mixed hyperlipidemia: Secondary | ICD-10-CM | POA: Diagnosis not present

## 2019-12-22 DIAGNOSIS — E103293 Type 1 diabetes mellitus with mild nonproliferative diabetic retinopathy without macular edema, bilateral: Secondary | ICD-10-CM | POA: Diagnosis not present

## 2019-12-31 DIAGNOSIS — E782 Mixed hyperlipidemia: Secondary | ICD-10-CM | POA: Diagnosis not present

## 2019-12-31 DIAGNOSIS — E103293 Type 1 diabetes mellitus with mild nonproliferative diabetic retinopathy without macular edema, bilateral: Secondary | ICD-10-CM | POA: Diagnosis not present

## 2019-12-31 DIAGNOSIS — I10 Essential (primary) hypertension: Secondary | ICD-10-CM | POA: Diagnosis not present

## 2019-12-31 DIAGNOSIS — Z9641 Presence of insulin pump (external) (internal): Secondary | ICD-10-CM | POA: Diagnosis not present

## 2020-01-04 DIAGNOSIS — E1065 Type 1 diabetes mellitus with hyperglycemia: Secondary | ICD-10-CM | POA: Diagnosis not present

## 2020-01-04 DIAGNOSIS — E109 Type 1 diabetes mellitus without complications: Secondary | ICD-10-CM | POA: Diagnosis not present

## 2020-01-04 DIAGNOSIS — Z9641 Presence of insulin pump (external) (internal): Secondary | ICD-10-CM | POA: Diagnosis not present

## 2020-02-04 DIAGNOSIS — E1065 Type 1 diabetes mellitus with hyperglycemia: Secondary | ICD-10-CM | POA: Diagnosis not present

## 2020-02-04 DIAGNOSIS — E109 Type 1 diabetes mellitus without complications: Secondary | ICD-10-CM | POA: Diagnosis not present

## 2020-02-04 DIAGNOSIS — Z9641 Presence of insulin pump (external) (internal): Secondary | ICD-10-CM | POA: Diagnosis not present

## 2020-03-02 DIAGNOSIS — E1065 Type 1 diabetes mellitus with hyperglycemia: Secondary | ICD-10-CM | POA: Diagnosis not present

## 2020-03-02 DIAGNOSIS — E109 Type 1 diabetes mellitus without complications: Secondary | ICD-10-CM | POA: Diagnosis not present

## 2020-03-02 DIAGNOSIS — Z9641 Presence of insulin pump (external) (internal): Secondary | ICD-10-CM | POA: Diagnosis not present

## 2020-03-03 DIAGNOSIS — E1065 Type 1 diabetes mellitus with hyperglycemia: Secondary | ICD-10-CM | POA: Diagnosis not present

## 2020-03-03 DIAGNOSIS — E109 Type 1 diabetes mellitus without complications: Secondary | ICD-10-CM | POA: Diagnosis not present

## 2020-03-03 DIAGNOSIS — Z9641 Presence of insulin pump (external) (internal): Secondary | ICD-10-CM | POA: Diagnosis not present

## 2020-03-04 DIAGNOSIS — Z9641 Presence of insulin pump (external) (internal): Secondary | ICD-10-CM | POA: Diagnosis not present

## 2020-03-04 DIAGNOSIS — E1065 Type 1 diabetes mellitus with hyperglycemia: Secondary | ICD-10-CM | POA: Diagnosis not present

## 2020-03-04 DIAGNOSIS — E109 Type 1 diabetes mellitus without complications: Secondary | ICD-10-CM | POA: Diagnosis not present

## 2020-03-07 DIAGNOSIS — Z9641 Presence of insulin pump (external) (internal): Secondary | ICD-10-CM | POA: Diagnosis not present

## 2020-03-07 DIAGNOSIS — E1065 Type 1 diabetes mellitus with hyperglycemia: Secondary | ICD-10-CM | POA: Diagnosis not present

## 2020-03-07 DIAGNOSIS — E109 Type 1 diabetes mellitus without complications: Secondary | ICD-10-CM | POA: Diagnosis not present

## 2020-03-14 DIAGNOSIS — Z03818 Encounter for observation for suspected exposure to other biological agents ruled out: Secondary | ICD-10-CM | POA: Diagnosis not present

## 2020-03-14 DIAGNOSIS — Z20822 Contact with and (suspected) exposure to covid-19: Secondary | ICD-10-CM | POA: Diagnosis not present

## 2020-04-06 DIAGNOSIS — E109 Type 1 diabetes mellitus without complications: Secondary | ICD-10-CM | POA: Diagnosis not present

## 2020-04-06 DIAGNOSIS — E1065 Type 1 diabetes mellitus with hyperglycemia: Secondary | ICD-10-CM | POA: Diagnosis not present

## 2020-04-06 DIAGNOSIS — Z9641 Presence of insulin pump (external) (internal): Secondary | ICD-10-CM | POA: Diagnosis not present

## 2020-04-11 DIAGNOSIS — E538 Deficiency of other specified B group vitamins: Secondary | ICD-10-CM | POA: Diagnosis not present

## 2020-04-11 DIAGNOSIS — E103293 Type 1 diabetes mellitus with mild nonproliferative diabetic retinopathy without macular edema, bilateral: Secondary | ICD-10-CM | POA: Diagnosis not present

## 2020-04-11 DIAGNOSIS — E782 Mixed hyperlipidemia: Secondary | ICD-10-CM | POA: Diagnosis not present

## 2020-04-11 DIAGNOSIS — E559 Vitamin D deficiency, unspecified: Secondary | ICD-10-CM | POA: Diagnosis not present

## 2020-04-21 DIAGNOSIS — I10 Essential (primary) hypertension: Secondary | ICD-10-CM | POA: Diagnosis not present

## 2020-04-21 DIAGNOSIS — E871 Hypo-osmolality and hyponatremia: Secondary | ICD-10-CM | POA: Diagnosis not present

## 2020-04-21 DIAGNOSIS — E782 Mixed hyperlipidemia: Secondary | ICD-10-CM | POA: Diagnosis not present

## 2020-04-21 DIAGNOSIS — E109 Type 1 diabetes mellitus without complications: Secondary | ICD-10-CM | POA: Diagnosis not present

## 2020-05-09 DIAGNOSIS — Z9641 Presence of insulin pump (external) (internal): Secondary | ICD-10-CM | POA: Diagnosis not present

## 2020-05-09 DIAGNOSIS — E109 Type 1 diabetes mellitus without complications: Secondary | ICD-10-CM | POA: Diagnosis not present

## 2020-05-09 DIAGNOSIS — E1065 Type 1 diabetes mellitus with hyperglycemia: Secondary | ICD-10-CM | POA: Diagnosis not present

## 2020-06-01 DIAGNOSIS — E1065 Type 1 diabetes mellitus with hyperglycemia: Secondary | ICD-10-CM | POA: Diagnosis not present

## 2020-06-01 DIAGNOSIS — E109 Type 1 diabetes mellitus without complications: Secondary | ICD-10-CM | POA: Diagnosis not present

## 2020-06-01 DIAGNOSIS — Z9641 Presence of insulin pump (external) (internal): Secondary | ICD-10-CM | POA: Diagnosis not present

## 2020-06-09 DIAGNOSIS — Z9641 Presence of insulin pump (external) (internal): Secondary | ICD-10-CM | POA: Diagnosis not present

## 2020-06-09 DIAGNOSIS — E109 Type 1 diabetes mellitus without complications: Secondary | ICD-10-CM | POA: Diagnosis not present

## 2020-06-09 DIAGNOSIS — E1065 Type 1 diabetes mellitus with hyperglycemia: Secondary | ICD-10-CM | POA: Diagnosis not present

## 2020-07-11 DIAGNOSIS — E1065 Type 1 diabetes mellitus with hyperglycemia: Secondary | ICD-10-CM | POA: Diagnosis not present

## 2020-07-11 DIAGNOSIS — Z9641 Presence of insulin pump (external) (internal): Secondary | ICD-10-CM | POA: Diagnosis not present

## 2020-07-11 DIAGNOSIS — E109 Type 1 diabetes mellitus without complications: Secondary | ICD-10-CM | POA: Diagnosis not present

## 2020-07-14 DIAGNOSIS — E119 Type 2 diabetes mellitus without complications: Secondary | ICD-10-CM | POA: Diagnosis not present

## 2020-07-14 DIAGNOSIS — H5203 Hypermetropia, bilateral: Secondary | ICD-10-CM | POA: Diagnosis not present

## 2020-07-14 DIAGNOSIS — H524 Presbyopia: Secondary | ICD-10-CM | POA: Diagnosis not present

## 2020-07-21 DIAGNOSIS — E103293 Type 1 diabetes mellitus with mild nonproliferative diabetic retinopathy without macular edema, bilateral: Secondary | ICD-10-CM | POA: Diagnosis not present

## 2020-07-21 DIAGNOSIS — Z125 Encounter for screening for malignant neoplasm of prostate: Secondary | ICD-10-CM | POA: Diagnosis not present

## 2020-07-28 DIAGNOSIS — Z9641 Presence of insulin pump (external) (internal): Secondary | ICD-10-CM | POA: Diagnosis not present

## 2020-07-28 DIAGNOSIS — Z794 Long term (current) use of insulin: Secondary | ICD-10-CM | POA: Diagnosis not present

## 2020-07-28 DIAGNOSIS — E103293 Type 1 diabetes mellitus with mild nonproliferative diabetic retinopathy without macular edema, bilateral: Secondary | ICD-10-CM | POA: Diagnosis not present

## 2020-07-28 DIAGNOSIS — Z978 Presence of other specified devices: Secondary | ICD-10-CM | POA: Diagnosis not present

## 2020-08-08 DIAGNOSIS — E109 Type 1 diabetes mellitus without complications: Secondary | ICD-10-CM | POA: Diagnosis not present

## 2020-08-08 DIAGNOSIS — Z9641 Presence of insulin pump (external) (internal): Secondary | ICD-10-CM | POA: Diagnosis not present

## 2020-08-08 DIAGNOSIS — E1065 Type 1 diabetes mellitus with hyperglycemia: Secondary | ICD-10-CM | POA: Diagnosis not present

## 2020-08-19 DIAGNOSIS — E1065 Type 1 diabetes mellitus with hyperglycemia: Secondary | ICD-10-CM | POA: Diagnosis not present

## 2020-08-19 DIAGNOSIS — E109 Type 1 diabetes mellitus without complications: Secondary | ICD-10-CM | POA: Diagnosis not present

## 2020-08-19 DIAGNOSIS — Z9641 Presence of insulin pump (external) (internal): Secondary | ICD-10-CM | POA: Diagnosis not present

## 2020-09-08 DIAGNOSIS — E1065 Type 1 diabetes mellitus with hyperglycemia: Secondary | ICD-10-CM | POA: Diagnosis not present

## 2020-09-08 DIAGNOSIS — E109 Type 1 diabetes mellitus without complications: Secondary | ICD-10-CM | POA: Diagnosis not present

## 2020-09-08 DIAGNOSIS — Z9641 Presence of insulin pump (external) (internal): Secondary | ICD-10-CM | POA: Diagnosis not present

## 2020-10-10 DIAGNOSIS — E1065 Type 1 diabetes mellitus with hyperglycemia: Secondary | ICD-10-CM | POA: Diagnosis not present

## 2020-10-10 DIAGNOSIS — E109 Type 1 diabetes mellitus without complications: Secondary | ICD-10-CM | POA: Diagnosis not present

## 2020-10-10 DIAGNOSIS — Z9641 Presence of insulin pump (external) (internal): Secondary | ICD-10-CM | POA: Diagnosis not present

## 2020-10-27 DIAGNOSIS — E782 Mixed hyperlipidemia: Secondary | ICD-10-CM | POA: Diagnosis not present

## 2020-10-27 DIAGNOSIS — E103293 Type 1 diabetes mellitus with mild nonproliferative diabetic retinopathy without macular edema, bilateral: Secondary | ICD-10-CM | POA: Diagnosis not present

## 2020-11-03 DIAGNOSIS — Z9641 Presence of insulin pump (external) (internal): Secondary | ICD-10-CM | POA: Diagnosis not present

## 2020-11-03 DIAGNOSIS — Z978 Presence of other specified devices: Secondary | ICD-10-CM | POA: Diagnosis not present

## 2020-11-03 DIAGNOSIS — E103293 Type 1 diabetes mellitus with mild nonproliferative diabetic retinopathy without macular edema, bilateral: Secondary | ICD-10-CM | POA: Diagnosis not present

## 2020-11-03 DIAGNOSIS — Z794 Long term (current) use of insulin: Secondary | ICD-10-CM | POA: Diagnosis not present

## 2020-11-10 DIAGNOSIS — Z9641 Presence of insulin pump (external) (internal): Secondary | ICD-10-CM | POA: Diagnosis not present

## 2020-11-10 DIAGNOSIS — E1065 Type 1 diabetes mellitus with hyperglycemia: Secondary | ICD-10-CM | POA: Diagnosis not present

## 2020-11-10 DIAGNOSIS — E109 Type 1 diabetes mellitus without complications: Secondary | ICD-10-CM | POA: Diagnosis not present

## 2020-11-18 DIAGNOSIS — Z9641 Presence of insulin pump (external) (internal): Secondary | ICD-10-CM | POA: Diagnosis not present

## 2020-11-18 DIAGNOSIS — E1065 Type 1 diabetes mellitus with hyperglycemia: Secondary | ICD-10-CM | POA: Diagnosis not present

## 2020-11-18 DIAGNOSIS — E109 Type 1 diabetes mellitus without complications: Secondary | ICD-10-CM | POA: Diagnosis not present

## 2020-12-12 DIAGNOSIS — E1065 Type 1 diabetes mellitus with hyperglycemia: Secondary | ICD-10-CM | POA: Diagnosis not present

## 2020-12-12 DIAGNOSIS — E109 Type 1 diabetes mellitus without complications: Secondary | ICD-10-CM | POA: Diagnosis not present

## 2020-12-12 DIAGNOSIS — Z9641 Presence of insulin pump (external) (internal): Secondary | ICD-10-CM | POA: Diagnosis not present

## 2021-01-12 DIAGNOSIS — E1065 Type 1 diabetes mellitus with hyperglycemia: Secondary | ICD-10-CM | POA: Diagnosis not present

## 2021-01-12 DIAGNOSIS — Z9641 Presence of insulin pump (external) (internal): Secondary | ICD-10-CM | POA: Diagnosis not present

## 2021-01-12 DIAGNOSIS — E109 Type 1 diabetes mellitus without complications: Secondary | ICD-10-CM | POA: Diagnosis not present

## 2021-02-13 DIAGNOSIS — Z9641 Presence of insulin pump (external) (internal): Secondary | ICD-10-CM | POA: Diagnosis not present

## 2021-02-13 DIAGNOSIS — E109 Type 1 diabetes mellitus without complications: Secondary | ICD-10-CM | POA: Diagnosis not present

## 2021-02-13 DIAGNOSIS — E1065 Type 1 diabetes mellitus with hyperglycemia: Secondary | ICD-10-CM | POA: Diagnosis not present

## 2021-02-16 DIAGNOSIS — E109 Type 1 diabetes mellitus without complications: Secondary | ICD-10-CM | POA: Diagnosis not present

## 2021-02-16 DIAGNOSIS — Z1339 Encounter for screening examination for other mental health and behavioral disorders: Secondary | ICD-10-CM | POA: Diagnosis not present

## 2021-02-16 DIAGNOSIS — I10 Essential (primary) hypertension: Secondary | ICD-10-CM | POA: Diagnosis not present

## 2021-02-16 DIAGNOSIS — Z1331 Encounter for screening for depression: Secondary | ICD-10-CM | POA: Diagnosis not present

## 2021-02-17 DIAGNOSIS — E10649 Type 1 diabetes mellitus with hypoglycemia without coma: Secondary | ICD-10-CM | POA: Diagnosis not present

## 2021-02-17 DIAGNOSIS — E1065 Type 1 diabetes mellitus with hyperglycemia: Secondary | ICD-10-CM | POA: Diagnosis not present

## 2021-02-17 DIAGNOSIS — Z9641 Presence of insulin pump (external) (internal): Secondary | ICD-10-CM | POA: Diagnosis not present

## 2021-02-17 DIAGNOSIS — I129 Hypertensive chronic kidney disease with stage 1 through stage 4 chronic kidney disease, or unspecified chronic kidney disease: Secondary | ICD-10-CM | POA: Diagnosis not present

## 2021-02-17 DIAGNOSIS — E1022 Type 1 diabetes mellitus with diabetic chronic kidney disease: Secondary | ICD-10-CM | POA: Diagnosis not present

## 2021-02-17 DIAGNOSIS — E109 Type 1 diabetes mellitus without complications: Secondary | ICD-10-CM | POA: Diagnosis not present

## 2021-03-16 DIAGNOSIS — Z9641 Presence of insulin pump (external) (internal): Secondary | ICD-10-CM | POA: Diagnosis not present

## 2021-03-16 DIAGNOSIS — E109 Type 1 diabetes mellitus without complications: Secondary | ICD-10-CM | POA: Diagnosis not present

## 2021-03-16 DIAGNOSIS — E1065 Type 1 diabetes mellitus with hyperglycemia: Secondary | ICD-10-CM | POA: Diagnosis not present

## 2021-03-21 ENCOUNTER — Emergency Department (HOSPITAL_BASED_OUTPATIENT_CLINIC_OR_DEPARTMENT_OTHER): Payer: BC Managed Care – PPO

## 2021-03-21 ENCOUNTER — Inpatient Hospital Stay (HOSPITAL_BASED_OUTPATIENT_CLINIC_OR_DEPARTMENT_OTHER)
Admission: EM | Admit: 2021-03-21 | Discharge: 2021-03-25 | DRG: 872 | Disposition: A | Discharge status: Home / Self Care | Payer: BC Managed Care – PPO | Attending: Internal Medicine | Admitting: Internal Medicine

## 2021-03-21 ENCOUNTER — Other Ambulatory Visit: Payer: Self-pay

## 2021-03-21 ENCOUNTER — Encounter (HOSPITAL_BASED_OUTPATIENT_CLINIC_OR_DEPARTMENT_OTHER): Payer: Self-pay | Admitting: Emergency Medicine

## 2021-03-21 DIAGNOSIS — E538 Deficiency of other specified B group vitamins: Secondary | ICD-10-CM | POA: Diagnosis present

## 2021-03-21 DIAGNOSIS — A419 Sepsis, unspecified organism: Secondary | ICD-10-CM | POA: Diagnosis present

## 2021-03-21 DIAGNOSIS — Z20822 Contact with and (suspected) exposure to covid-19: Secondary | ICD-10-CM | POA: Diagnosis not present

## 2021-03-21 DIAGNOSIS — E871 Hypo-osmolality and hyponatremia: Secondary | ICD-10-CM | POA: Diagnosis not present

## 2021-03-21 DIAGNOSIS — Z90410 Acquired total absence of pancreas: Secondary | ICD-10-CM

## 2021-03-21 DIAGNOSIS — E872 Acidosis, unspecified: Secondary | ICD-10-CM | POA: Diagnosis not present

## 2021-03-21 DIAGNOSIS — I447 Left bundle-branch block, unspecified: Secondary | ICD-10-CM | POA: Diagnosis present

## 2021-03-21 DIAGNOSIS — A4151 Sepsis due to Escherichia coli [E. coli]: Secondary | ICD-10-CM | POA: Diagnosis not present

## 2021-03-21 DIAGNOSIS — Z923 Personal history of irradiation: Secondary | ICD-10-CM | POA: Diagnosis not present

## 2021-03-21 DIAGNOSIS — Z9641 Presence of insulin pump (external) (internal): Secondary | ICD-10-CM | POA: Diagnosis present

## 2021-03-21 DIAGNOSIS — A4189 Other specified sepsis: Secondary | ICD-10-CM | POA: Diagnosis present

## 2021-03-21 DIAGNOSIS — M19072 Primary osteoarthritis, left ankle and foot: Secondary | ICD-10-CM | POA: Diagnosis present

## 2021-03-21 DIAGNOSIS — K219 Gastro-esophageal reflux disease without esophagitis: Secondary | ICD-10-CM | POA: Diagnosis present

## 2021-03-21 DIAGNOSIS — R1012 Left upper quadrant pain: Secondary | ICD-10-CM

## 2021-03-21 DIAGNOSIS — Z794 Long term (current) use of insulin: Secondary | ICD-10-CM

## 2021-03-21 DIAGNOSIS — B999 Unspecified infectious disease: Secondary | ICD-10-CM | POA: Diagnosis not present

## 2021-03-21 DIAGNOSIS — K56609 Unspecified intestinal obstruction, unspecified as to partial versus complete obstruction: Secondary | ICD-10-CM

## 2021-03-21 DIAGNOSIS — R945 Abnormal results of liver function studies: Secondary | ICD-10-CM | POA: Diagnosis not present

## 2021-03-21 DIAGNOSIS — R6520 Severe sepsis without septic shock: Secondary | ICD-10-CM | POA: Diagnosis not present

## 2021-03-21 DIAGNOSIS — I10 Essential (primary) hypertension: Secondary | ICD-10-CM | POA: Diagnosis not present

## 2021-03-21 DIAGNOSIS — R350 Frequency of micturition: Secondary | ICD-10-CM | POA: Diagnosis present

## 2021-03-21 DIAGNOSIS — E669 Obesity, unspecified: Secondary | ICD-10-CM | POA: Diagnosis not present

## 2021-03-21 DIAGNOSIS — R9431 Abnormal electrocardiogram [ECG] [EKG]: Secondary | ICD-10-CM

## 2021-03-21 DIAGNOSIS — B961 Klebsiella pneumoniae [K. pneumoniae] as the cause of diseases classified elsewhere: Secondary | ICD-10-CM | POA: Diagnosis not present

## 2021-03-21 DIAGNOSIS — R7881 Bacteremia: Secondary | ICD-10-CM | POA: Diagnosis not present

## 2021-03-21 DIAGNOSIS — G4733 Obstructive sleep apnea (adult) (pediatric): Secondary | ICD-10-CM | POA: Diagnosis present

## 2021-03-21 DIAGNOSIS — R748 Abnormal levels of other serum enzymes: Secondary | ICD-10-CM | POA: Diagnosis not present

## 2021-03-21 DIAGNOSIS — Z8572 Personal history of non-Hodgkin lymphomas: Secondary | ICD-10-CM

## 2021-03-21 DIAGNOSIS — K56600 Partial intestinal obstruction, unspecified as to cause: Secondary | ICD-10-CM | POA: Diagnosis present

## 2021-03-21 DIAGNOSIS — I700 Atherosclerosis of aorta: Secondary | ICD-10-CM | POA: Diagnosis present

## 2021-03-21 DIAGNOSIS — Z7982 Long term (current) use of aspirin: Secondary | ICD-10-CM

## 2021-03-21 DIAGNOSIS — Z9221 Personal history of antineoplastic chemotherapy: Secondary | ICD-10-CM | POA: Diagnosis not present

## 2021-03-21 DIAGNOSIS — E1069 Type 1 diabetes mellitus with other specified complication: Secondary | ICD-10-CM

## 2021-03-21 DIAGNOSIS — K5669 Other partial intestinal obstruction: Secondary | ICD-10-CM | POA: Diagnosis not present

## 2021-03-21 DIAGNOSIS — Z91013 Allergy to seafood: Secondary | ICD-10-CM

## 2021-03-21 DIAGNOSIS — Z6831 Body mass index (BMI) 31.0-31.9, adult: Secondary | ICD-10-CM | POA: Diagnosis not present

## 2021-03-21 DIAGNOSIS — E559 Vitamin D deficiency, unspecified: Secondary | ICD-10-CM | POA: Diagnosis present

## 2021-03-21 DIAGNOSIS — K4091 Unilateral inguinal hernia, without obstruction or gangrene, recurrent: Secondary | ICD-10-CM | POA: Diagnosis not present

## 2021-03-21 DIAGNOSIS — Z888 Allergy status to other drugs, medicaments and biological substances status: Secondary | ICD-10-CM

## 2021-03-21 DIAGNOSIS — Z79899 Other long term (current) drug therapy: Secondary | ICD-10-CM

## 2021-03-21 DIAGNOSIS — R7401 Elevation of levels of liver transaminase levels: Secondary | ICD-10-CM | POA: Diagnosis present

## 2021-03-21 DIAGNOSIS — N179 Acute kidney failure, unspecified: Secondary | ICD-10-CM

## 2021-03-21 DIAGNOSIS — Z9049 Acquired absence of other specified parts of digestive tract: Secondary | ICD-10-CM | POA: Diagnosis not present

## 2021-03-21 DIAGNOSIS — Z90411 Acquired partial absence of pancreas: Secondary | ICD-10-CM

## 2021-03-21 DIAGNOSIS — R339 Retention of urine, unspecified: Secondary | ICD-10-CM | POA: Diagnosis present

## 2021-03-21 DIAGNOSIS — E109 Type 1 diabetes mellitus without complications: Secondary | ICD-10-CM

## 2021-03-21 DIAGNOSIS — E10319 Type 1 diabetes mellitus with unspecified diabetic retinopathy without macular edema: Secondary | ICD-10-CM | POA: Diagnosis present

## 2021-03-21 DIAGNOSIS — K4090 Unilateral inguinal hernia, without obstruction or gangrene, not specified as recurrent: Secondary | ICD-10-CM | POA: Diagnosis present

## 2021-03-21 DIAGNOSIS — E785 Hyperlipidemia, unspecified: Secondary | ICD-10-CM | POA: Diagnosis present

## 2021-03-21 DIAGNOSIS — R109 Unspecified abdominal pain: Secondary | ICD-10-CM | POA: Diagnosis present

## 2021-03-21 LAB — URINALYSIS, ROUTINE W REFLEX MICROSCOPIC
Glucose, UA: 250 mg/dL — AB
Hgb urine dipstick: NEGATIVE
Ketones, ur: 15 mg/dL — AB
Leukocytes,Ua: NEGATIVE
Nitrite: NEGATIVE
Protein, ur: 30 mg/dL — AB
Specific Gravity, Urine: 1.037 — ABNORMAL HIGH (ref 1.005–1.030)
pH: 8.5 — ABNORMAL HIGH (ref 5.0–8.0)

## 2021-03-21 LAB — CBC
HCT: 41.3 % (ref 39.0–52.0)
Hemoglobin: 14.1 g/dL (ref 13.0–17.0)
MCH: 28.4 pg (ref 26.0–34.0)
MCHC: 34.1 g/dL (ref 30.0–36.0)
MCV: 83.3 fL (ref 80.0–100.0)
Platelets: 228 10*3/uL (ref 150–400)
RBC: 4.96 MIL/uL (ref 4.22–5.81)
RDW: 13.3 % (ref 11.5–15.5)
WBC: 18.1 10*3/uL — ABNORMAL HIGH (ref 4.0–10.5)
nRBC: 0 % (ref 0.0–0.2)

## 2021-03-21 LAB — LACTIC ACID, PLASMA
Lactic Acid, Venous: 2.7 mmol/L (ref 0.5–1.9)
Lactic Acid, Venous: 3.2 mmol/L (ref 0.5–1.9)
Lactic Acid, Venous: 2.7 mmol/L (ref 0.5–1.9)

## 2021-03-21 LAB — COMPREHENSIVE METABOLIC PANEL
ALT: 546 U/L — ABNORMAL HIGH (ref 0–44)
AST: 623 U/L — ABNORMAL HIGH (ref 15–41)
Albumin: 3.9 g/dL (ref 3.5–5.0)
Alkaline Phosphatase: 365 U/L — ABNORMAL HIGH (ref 38–126)
Anion gap: 13 (ref 5–15)
BUN: 15 mg/dL (ref 8–23)
CO2: 21 mmol/L — ABNORMAL LOW (ref 22–32)
Calcium: 9.3 mg/dL (ref 8.9–10.3)
Chloride: 94 mmol/L — ABNORMAL LOW (ref 98–111)
Creatinine, Ser: 1.31 mg/dL — ABNORMAL HIGH (ref 0.61–1.24)
GFR, Estimated: >60 mL/min (ref >60)
Glucose, Bld: 190 mg/dL — ABNORMAL HIGH (ref 70–99)
Potassium: 4.8 mmol/L (ref 3.5–5.1)
Sodium: 128 mmol/L — ABNORMAL LOW (ref 135–145)
Total Bilirubin: 3.1 mg/dL — ABNORMAL HIGH (ref 0.3–1.2)
Total Protein: 6.7 g/dL (ref 6.5–8.1)

## 2021-03-21 LAB — TROPONIN I (HIGH SENSITIVITY): Troponin I (High Sensitivity): 12 ng/L (ref <18)

## 2021-03-21 LAB — RESP PANEL BY RT-PCR (FLU A&B, COVID) ARPGX2
Influenza A by PCR: NEGATIVE
Influenza B by PCR: NEGATIVE
SARS Coronavirus 2 by RT PCR: NEGATIVE

## 2021-03-21 LAB — LIPASE, BLOOD: Lipase: <10 U/L — ABNORMAL LOW (ref 11–51)

## 2021-03-21 MED ORDER — IOHEXOL 300 MG/ML  SOLN
100.0000 mL | Freq: Once | INTRAMUSCULAR | Status: AC | PRN
  Administered 2021-03-21: 100 mL via INTRAVENOUS

## 2021-03-21 MED ORDER — SODIUM CHLORIDE 0.9 % IV BOLUS
2000.0000 mL | Freq: Once | INTRAVENOUS | Status: AC
  Administered 2021-03-21 (×2): 1000 mL via INTRAVENOUS

## 2021-03-21 MED ORDER — SODIUM CHLORIDE 0.9 % IV BOLUS
500.0000 mL | Freq: Once | INTRAVENOUS | Status: AC
  Administered 2021-03-21: 500 mL via INTRAVENOUS

## 2021-03-21 MED ORDER — VANCOMYCIN HCL IN DEXTROSE 1-5 GM/200ML-% IV SOLN
1000.0000 mg | Freq: Once | INTRAVENOUS | Status: AC
  Administered 2021-03-21: 1000 mg via INTRAVENOUS

## 2021-03-21 MED ORDER — VANCOMYCIN HCL 1250 MG/250ML IV SOLN
1250.0000 mg | INTRAVENOUS | Status: DC
  Filled 2021-03-21: qty 250

## 2021-03-21 MED ORDER — PANTOPRAZOLE SODIUM 40 MG PO TBEC
40.0000 mg | DELAYED_RELEASE_TABLET | Freq: Every day | ORAL | Status: DC
  Administered 2021-03-22 – 2021-03-25 (×4): 40 mg via ORAL
  Filled 2021-03-21 (×4): qty 1

## 2021-03-21 MED ORDER — FENTANYL CITRATE PF 50 MCG/ML IJ SOSY
50.0000 ug | PREFILLED_SYRINGE | Freq: Once | INTRAMUSCULAR | Status: AC
  Administered 2021-03-21: 50 ug via INTRAVENOUS
  Filled 2021-03-21: qty 1

## 2021-03-21 MED ORDER — NEBIVOLOL HCL 10 MG PO TABS
10.0000 mg | ORAL_TABLET | Freq: Every day | ORAL | Status: DC
  Administered 2021-03-22 – 2021-03-25 (×4): 10 mg via ORAL
  Filled 2021-03-21 (×4): qty 1

## 2021-03-21 MED ORDER — ASPIRIN 81 MG PO CHEW
81.0000 mg | CHEWABLE_TABLET | Freq: Once | ORAL | Status: AC
  Administered 2021-03-21: 81 mg via ORAL
  Filled 2021-03-21: qty 1

## 2021-03-21 MED ORDER — SODIUM CHLORIDE 0.9 % IV SOLN: INTRAVENOUS | Status: DC

## 2021-03-21 MED ORDER — PIPERACILLIN-TAZOBACTAM 3.375 G IVPB
3.3750 g | Freq: Three times a day (TID) | INTRAVENOUS | Status: DC
  Administered 2021-03-21 – 2021-03-25 (×10): 3.375 g via INTRAVENOUS
  Filled 2021-03-21 (×12): qty 50

## 2021-03-21 MED ORDER — VANCOMYCIN HCL IN DEXTROSE 1-5 GM/200ML-% IV SOLN
1000.0000 mg | Freq: Once | INTRAVENOUS | Status: DC
  Filled 2021-03-21: qty 200

## 2021-03-21 MED ORDER — PIPERACILLIN-TAZOBACTAM 3.375 G IVPB 30 MIN
3.3750 g | Freq: Once | INTRAVENOUS | Status: AC
  Administered 2021-03-21: 3.375 g via INTRAVENOUS
  Filled 2021-03-21: qty 50

## 2021-03-21 NOTE — Progress Notes (Signed)
Pharmacy Antibiotic Note  Robert Mercer is a 60 y.o. male admitted on 03/21/2021 with  intra-abdominal infection .  Pharmacy has been consulted for Zosyn dosing.  Plan: Zosyn 3.375g IV q8h (4 hour infusion).  Height: 5\' 9"  (175.3 cm) Weight: 97.1 kg (214 lb) IBW/kg (Calculated) : 70.7  Temp (24hrs), Avg:99.8 F (37.7 C), Min:99.8 F (37.7 C), Max:99.8 F (37.7 C)  Recent Labs  Lab 03/21/21 1425 03/21/21 1444  WBC 18.1*  --   CREATININE 1.31*  --   LATICACIDVEN  --  2.7*    Estimated Creatinine Clearance: 68.1 mL/min (A) (by C-G formula based on SCr of 1.31 mg/dL (H)).    Allergies  Allergen Reactions   Azilsartan Other (See Comments)    Possibly caused hives (as Edarbyclor) on 06/16/14   Chlorthalidone Other (See Comments)    Possibly caused hives (as Edarbyclor) on 06/16/14   Shellfish Allergy    Amlodipine Hives, Itching and Rash   Lisinopril Hives, Itching and Rash    Antimicrobials this admission: Zosyn 3.375 gm IV x 1    Microbiology results: pending  Thank you for allowing pharmacy to be a part of this patient's care.  Mallie Mussel A Delinda Malan 03/21/2021 4:03 PM

## 2021-03-21 NOTE — ED Provider Notes (Signed)
[facility] EMERGENCY DEPT Provider Note   CSN: [id number] Arrival date & time: 03/21/21  1340     History Chief Complaint  Patient presents with   Abdominal Pain    Robert Mercer is a 60 y.o. male.  The history is provided by the patient, a friend and medical records. No language interpreter was used.  Abdominal Pain Pain location:  LUQ and epigastric Pain quality: aching and pressure   Pain radiates to:  Does not radiate Pain severity:  Moderate Onset quality:  Gradual Duration:  1 day Timing:  Constant Progression:  Waxing and waning Chronicity:  New Context: previous surgery   Relieved by:  Nothing Worsened by:  Palpation Ineffective treatments:  None tried Associated symptoms: fatigue and nausea (improved)   Associated symptoms: no chest pain, no chills, no constipation, no cough, no diarrhea, no dysuria, no fever, no shortness of breath and no vomiting       Past Medical History:  Diagnosis Date   Arthritis    "left ankle; I broke it in high school" (09/03/2016)   Childhood asthma    Diabetes mellitus without complication (HCC)    Novolog insulin pump.Average fasting blood sugar runs 145   Diabetic retinopathy associated with type 1 diabetes mellitus ([place])    Diffuse large B cell lymphoma (HCC)    Dyslipidemia    GERD (gastroesophageal reflux disease)    takes Omeprazole daily   History of blood transfusion 2003   related to Whipple; developed itching/blotching X 1; transfused later also related to the Whipple; no problems" (09/03/2016)   Hypertension    takes Bystolic and Diovan daily   Joint pain    Nocturia    Pancreatic lymphoma ([place]) 02/14/2002   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 Valere Dross   Sleep apnea    mild to moderate; "couldn't tolerate CPAP; I wear an appliance" (09/03/2016)   Vitamin B12 deficiency    takes B12 every 30 days.   Vitamin D deficiency     takes Vitamin D daily    Patient Active Problem List   Diagnosis Date Noted   Ventral incisional hernia 09/03/2016   Incisional hernia, without obstruction or gangrene 08/30/2016   HYPERLIPIDEMIA 04/20/2010   OBSTRUCTIVE SLEEP APNEA 09/19/2008   DM 08/01/2008   ALLERGIC RHINITIS 08/01/2008   TOXIC EFFECT OF FISH AND SHELLFISH 08/01/2008   Pancreatic lymphoma ([place]) 02/14/2002    Past Surgical History:  Procedure Laterality Date   CHOLECYSTECTOMY  2003   COLONOSCOPY  05/2007   ESOPHAGOGASTRODUODENOSCOPY     EUS N/A 07/23/2012   Procedure: FULL UPPER ENDOSCOPIC ULTRASOUND (EUS) RADIAL;  Surgeon: Arta Silence, MD;  Location: [facility];  Service: Endoscopy;  Laterality: N/A;   HERNIA REPAIR     INSERTION OF MESH N/A 09/03/2016   Procedure: INSERTION OF MESH;  Surgeon: Armandina Gemma, MD;  Location: [facility];  Service: General;  Laterality: N/A;   LAPAROSCOPIC INCISIONAL / UMBILICAL / [place]     w/mesh/notes 09/03/2016   percuteaneous puncture  X 3   to place stent in one of my ducts related to scar tissue from [place] N/A 09/03/2016   Procedure: [place];  Surgeon: Armandina Gemma, MD;  Location: [facility];  Service: General;  Laterality: N/A;   [place]  2003   pancreatic lymphoma  WISDOM TOOTH EXTRACTION         History reviewed. No pertinent family history.  Social History   Tobacco Use   Smoking status: Never   Smokeless tobacco: Never  Substance Use Topics   Alcohol use: Yes    Alcohol/week: 6.0 standard drinks    Types: 6 Cans of beer per week   Drug use: No    Home Medications Prior to Admission medications   Medication Sig Start Date End Date Taking? Authorizing Provider  aspirin 81 MG tablet Take 81 mg by mouth daily.   Yes [provider]  BYSTOLIC 10 MG tablet Take 10 mg by mouth daily. 08/08/16  Yes [provider]  CREON  24000-76000 units CPEP Take 5-6 capsules by mouth 3 (three) times daily with meals. Take 3 capsules with snacks 08/18/16  Yes [provider]  cyanocobalamin (,VITAMIN B-12,) 1000 MCG/ML injection Inject 1,000 mcg into the muscle every 30 (thirty) days.    Yes [provider]  glucosamine-chondroitin 500-400 MG tablet Take 1 tablet by mouth daily.    Yes [provider]  NOVOLOG 100 UNIT/ML injection Mini med insulin pump 07/06/12  Yes [provider]  valsartan (DIOVAN) 320 MG tablet Take 320 mg by mouth daily. 08/08/16  Yes [provider]  Vitamin D, Ergocalciferol, (DRISDOL) 50000 UNITS CAPS Take 50,000 Units by mouth every Monday.    Yes [provider]  esomeprazole (NEXIUM) 20 MG capsule Take 20 mg by mouth daily at 12 noon.    [provider]  HYDROcodone-acetaminophen (NORCO/VICODIN) 5-325 MG tablet Take 1-2 tablets by mouth every 4 (four) hours as needed for moderate pain. Patient not taking: Reported on 03/21/2021 09/05/16   Armandina Gemma, MD    Allergies    Azilsartan, Chlorthalidone, Shellfish allergy, Amlodipine, and Lisinopril  Review of Systems   Review of Systems  Constitutional:  Positive for fatigue. Negative for chills and fever.  HENT:  Negative for congestion.   Respiratory:  Negative for cough, chest tightness, shortness of breath and wheezing.   Cardiovascular:  Negative for chest pain.  Gastrointestinal:  Positive for abdominal pain and nausea (improved). Negative for constipation, diarrhea and vomiting.  Genitourinary:  Negative for dysuria, flank pain and frequency.  Musculoskeletal:  Negative for back pain and neck pain.  Skin:  Positive for pallor.  Neurological:  Negative for dizziness and headaches.  Psychiatric/Behavioral:  Negative for agitation.   All other systems reviewed and are negative.  Physical Exam Updated Vital Signs BP (!) 176/83 (BP Location: Right Arm)   Pulse 94   Temp 99.8 F (37.7  C)   Resp 20   Ht 5\' 9"  (1.753 m)   Wt 97.1 kg   SpO2 95%   BMI 31.60 kg/m   Physical Exam Vitals and nursing note reviewed.  Constitutional:      General: He is not in acute distress.    Appearance: He is well-developed. He is ill-appearing. He is not toxic-appearing or diaphoretic.  HENT:     Head: Normocephalic and atraumatic.  Eyes:     Conjunctiva/sclera: Conjunctivae normal.  Cardiovascular:     Rate and Rhythm: Normal rate and regular rhythm.     Heart sounds: Normal heart sounds. No murmur heard. Pulmonary:     Effort: Pulmonary effort is normal. No respiratory distress.     Breath sounds: Normal breath sounds.  Abdominal:     General: Bowel sounds are normal. There is distension.     Palpations: Abdomen  is soft.     Tenderness: There is abdominal tenderness in the epigastric area and left upper quadrant.     Comments: Surgical scars present  Musculoskeletal:     Cervical back: Neck supple.  Skin:    General: Skin is warm and dry.     Capillary Refill: Capillary refill takes less than 2 seconds.  Neurological:     General: No focal deficit present.     Mental Status: He is alert.  Psychiatric:        Mood and Affect: Mood normal.    ED Results / Procedures / Treatments   Labs (all labs ordered are listed, but only abnormal results are displayed) Labs Reviewed  LIPASE, BLOOD - Abnormal; Notable for the following components:      Result Value   Lipase <10 (*)    All other components within normal limits  COMPREHENSIVE METABOLIC PANEL - Abnormal; Notable for the following components:   Sodium 128 (*)    Chloride 94 (*)    CO2 21 (*)    Glucose, Bld 190 (*)    Creatinine, Ser 1.31 (*)    AST 623 (*)    ALT 546 (*)    Alkaline Phosphatase 365 (*)    Total Bilirubin 3.1 (*)    All other components within normal limits  CBC - Abnormal; Notable for the following components:   WBC 18.1 (*)    All other components within normal limits  LACTIC ACID, PLASMA  - Abnormal; Notable for the following components:   Lactic Acid, Venous 2.7 (*)    All other components within normal limits  RESP PANEL BY RT-PCR (FLU A&B, COVID) ARPGX2  URINE CULTURE  CULTURE, BLOOD (ROUTINE X 2)  CULTURE, BLOOD (ROUTINE X 2)  URINALYSIS, ROUTINE W REFLEX MICROSCOPIC  LACTIC ACID, PLASMA    EKG None  Radiology No results found.  Procedures Procedures   Medications Ordered in ED Medications  piperacillin-tazobactam (ZOSYN) IVPB 3.375 g (has no administration in time range)  piperacillin-tazobactam (ZOSYN) IVPB 3.375 g (has no administration in time range)  sodium chloride 0.9 % bolus 500 mL (500 mLs Intravenous New Bag/Given 03/21/21 1517)  fentaNYL (SUBLIMAZE) injection 50 mcg (50 mcg Intravenous Given 03/21/21 1518)  iohexol (OMNIPAQUE) 300 MG/ML solution 100 mL (100 mLs Intravenous Contrast Given 03/21/21 1544)  sodium chloride 0.9 % bolus 2,000 mL (1,000 mLs Intravenous New Bag/Given 03/21/21 1610)    ED Course  I have reviewed the triage vital signs and the nursing notes.  Pertinent labs & imaging results that were available during my care of the patient were reviewed by me and considered in my medical decision making (see chart for details).    MDM Rules/Calculators/A&P                           DARRNELL MANGIARACINA is a 61 y.o. male with a past medical history significant for diabetes, hyperlipidemia, previous pancreatic lymphoma status post Whipple procedure and ventral hernia who presents with abdominal pain, mild nausea, and fatigue.  According to patient, last night before bed he started having some abdominal discomfort that was moderate in the left upper quadrant.  He reports it has persisted today and now he is seeing more fatigued and sleepy.  He had nausea earlier but is not having nausea now.  Reports the pain is moderate to severe.  He reports he does feel more distended and swollen particularly on the left side  of his abdomen.  He reports this is a new  pain and he has not had a like this before.  He denies any actual fevers or chills and also denies any constipation or diarrhea.  He reports he did have a bowel movement this morning and still passing some gas.  Denies any trauma or any rashes.  Denies any congestion, cough, chest pain, shortness of breath.  On exam, abdomen is tender to palpation.  It also appears slightly distended asymmetrically on the left upper quadrant.  Back nontender.  Lungs clear.  Chest nontender.  Bowel sounds were appreciated.  Patient is overall ill-appearing and pale.  Clinically I am concerned the patient could have bowel obstruction, internal herniation, or other acute abdominal pathology.  We will get CT abdomen pelvis ordered, will give pain medicine, will give some fluids, and will get screening labs.  We will get a likely preadmission or preprocedural COVID/flu test.  Care transferred to oncoming team while waiting for results to return.  Anticipate reassessment after work-up is completed to determine disposition.     Final Clinical Impression(s) / ED Diagnoses Final diagnoses:  Left upper quadrant abdominal pain    Clinical Impression: 1. Left upper quadrant abdominal pain     Disposition: Care transferred to oncoming team while waiting for results to return.  Anticipate reassessment after work-up is completed to determine disposition.    This note was prepared with assistance of Systems analyst. Occasional wrong-word or sound-a-like substitutions may have occurred due to the inherent limitations of voice recognition software.     Lometa Riggin, Gwenyth Allegra, MD 03/21/21 [phone]

## 2021-03-21 NOTE — ED Provider Notes (Signed)
[facility] EMERGENCY DEPT Provider Note   CSN: [id number] Arrival date & time: 03/21/21  1340     History Chief Complaint  Patient presents with   Abdominal Pain    Robert Mercer is a 60 y.o. male with a history of diabetes, hyperlipidemia, hypertension, obesity, diffuse large B-cell lymphoma, Whipple procedure w/ cholecystectomy, inguinal hernia, presenting to the ED with general fatigue, loss of appetite and vomiting.  The patient reports that he felt he had a poor appetite today and this morning.  He says he feels generally fatigued and had no energy, denies specifically lightheadedness.  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  He reports that he has had for several days or weeks left upper quadrant abdominal pain that is worse at night when lying in bed.  He denies any prior history of bowel obstructions.  He denies any chest pain or pressure.  He reports he had a very small bowel movement today but does not feel that this is regular for him.  He is passing flatus.  He denies fevers or chills.  He did vomit here in the emergency department.  HPI     Past Medical History:  Diagnosis Date   Arthritis    "left ankle; I broke it in high school" (09/03/2016)   Childhood asthma    Diabetes mellitus without complication (HCC)    Novolog insulin pump.Average fasting blood sugar runs 145   Diabetic retinopathy associated with type 1 diabetes mellitus ([place])    Diffuse large B cell lymphoma (HCC)    Dyslipidemia    GERD (gastroesophageal reflux disease)    takes Omeprazole daily   History of blood transfusion 2003   related to Whipple; developed itching/blotching X 1; transfused later also related to the Whipple; no problems" (09/03/2016)   Hypertension    takes Bystolic and Diovan daily   Joint pain    Nocturia    Pancreatic lymphoma ([place])  02/14/2002   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 Valere Dross   Sleep apnea    mild to moderate; "couldn't tolerate CPAP; I wear an appliance" (09/03/2016)   Vitamin B12 deficiency    takes B12 every 30 days.   Vitamin D deficiency    takes Vitamin D daily    Patient Active Problem List   Diagnosis Date Noted   Ventral incisional hernia 09/03/2016   Incisional hernia, without obstruction or gangrene 08/30/2016   HYPERLIPIDEMIA 04/20/2010   OBSTRUCTIVE SLEEP APNEA 09/19/2008   DM 08/01/2008   ALLERGIC RHINITIS 08/01/2008   TOXIC EFFECT OF FISH AND SHELLFISH 08/01/2008   Pancreatic lymphoma ([place]) 02/14/2002    Past Surgical History:  Procedure Laterality Date   CHOLECYSTECTOMY  2003   COLONOSCOPY  05/2007   ESOPHAGOGASTRODUODENOSCOPY     EUS N/A 07/23/2012   Procedure: FULL UPPER ENDOSCOPIC ULTRASOUND (EUS) RADIAL;  Surgeon: Arta Silence, MD;  Location: [facility];  Service: Endoscopy;  Laterality: N/A;   HERNIA REPAIR     INSERTION OF MESH N/A 09/03/2016   Procedure: INSERTION OF MESH;  Surgeon: Armandina Gemma, MD;  Location: [facility];  Service: General;  Laterality: N/A;   LAPAROSCOPIC INCISIONAL / UMBILICAL / [place]     w/mesh/notes 09/03/2016   percuteaneous puncture  X 3   to place  stent in one of my ducts related to scar tissue from [place] N/A 09/03/2016   Procedure: [place];  Surgeon: Armandina Gemma, MD;  Location: [facility];  Service: General;  Laterality: N/A;   WHIPPLE PROCEDURE  2003   pancreatic lymphoma   WISDOM TOOTH EXTRACTION         History reviewed. No pertinent family history.  Social History   Tobacco Use   Smoking status: Never   Smokeless tobacco: Never  Substance Use Topics   Alcohol use: Yes    Alcohol/week: 6.0 standard drinks    Types: 6 Cans of beer per week   Drug use:  No    Home Medications Prior to Admission medications   Medication Sig Start Date End Date Taking? Authorizing Provider  aspirin 81 MG tablet Take 81 mg by mouth daily.   Yes [provider]  BYSTOLIC 10 MG tablet Take 10 mg by mouth daily. 08/08/16  Yes [provider]  CREON 24000-76000 units CPEP Take 5-6 capsules by mouth 3 (three) times daily with meals. Take 3 capsules with snacks 08/18/16  Yes [provider]  cyanocobalamin (,VITAMIN B-12,) 1000 MCG/ML injection Inject 1,000 mcg into the muscle every 30 (thirty) days.    Yes [provider]  glucosamine-chondroitin 500-400 MG tablet Take 1 tablet by mouth daily.    Yes [provider]  NOVOLOG 100 UNIT/ML injection Mini med insulin pump 07/06/12  Yes [provider]  valsartan (DIOVAN) 320 MG tablet Take 320 mg by mouth daily. 08/08/16  Yes [provider]  Vitamin D, Ergocalciferol, (DRISDOL) 50000 UNITS CAPS Take 50,000 Units by mouth every Monday.    Yes [provider]  esomeprazole (NEXIUM) 20 MG capsule Take 20 mg by mouth daily at 12 noon.    [provider]  HYDROcodone-acetaminophen (NORCO/VICODIN) 5-325 MG tablet Take 1-2 tablets by mouth every 4 (four) hours as needed for moderate pain. Patient not taking: Reported on 03/21/2021 09/05/16   Armandina Gemma, MD    Allergies    Azilsartan, Chlorthalidone, Shellfish allergy, Amlodipine, and Lisinopril  Review of Systems   Review of Systems  Constitutional:  Negative for chills and fever.  HENT:  Negative for ear pain and sore throat.   Eyes:  Negative for pain and visual disturbance.  Respiratory:  Negative for cough and shortness of breath.   Cardiovascular:  Negative for chest pain and palpitations.  Gastrointestinal:  Positive for abdominal pain, constipation, nausea and vomiting.  Genitourinary:  Negative for dysuria and hematuria.  Musculoskeletal:  Negative for arthralgias and back pain.  Skin:   Negative for color change and rash.  Neurological:  Negative for syncope and headaches.  All other systems reviewed and are negative.  Physical Exam Updated Vital Signs BP (!) 145/58 (BP Location: Right Arm)   Pulse 97   Temp 99.8 F (37.7 C)   Resp (!) 23   Ht 5\' 9"  (1.753 m)   Wt 97.1 kg   SpO2 97%   BMI 31.60 kg/m   Physical Exam Constitutional:      General: He is not in acute distress. HENT:     Head: Normocephalic and atraumatic.  Eyes:     Conjunctiva/sclera: Conjunctivae normal.     Pupils: Pupils are equal, round, and reactive to light.  Cardiovascular:     Rate and Rhythm: Normal rate and regular rhythm.  Pulmonary:  Effort: Pulmonary effort is normal. No respiratory distress.  Abdominal:     General: There is distension.     Tenderness: There is no abdominal tenderness. There is no guarding.  Skin:    General: Skin is warm and dry.  Neurological:     General: No focal deficit present.     Mental Status: He is alert. Mental status is at baseline.  Psychiatric:        Mood and Affect: Mood normal.        Behavior: Behavior normal.    ED Results / Procedures / Treatments   Labs (all labs ordered are listed, but only abnormal results are displayed) Labs Reviewed  LIPASE, BLOOD - Abnormal; Notable for the following components:      Result Value   Lipase <10 (*)    All other components within normal limits  COMPREHENSIVE METABOLIC PANEL - Abnormal; Notable for the following components:   Sodium 128 (*)    Chloride 94 (*)    CO2 21 (*)    Glucose, Bld 190 (*)    Creatinine, Ser 1.31 (*)    AST 623 (*)    ALT 546 (*)    Alkaline Phosphatase 365 (*)    Total Bilirubin 3.1 (*)    All other components within normal limits  CBC - Abnormal; Notable for the following components:   WBC 18.1 (*)    All other components within normal limits  URINALYSIS, ROUTINE W REFLEX MICROSCOPIC - Abnormal; Notable for the following components:   Specific Gravity,  Urine 1.037 (*)    pH 8.5 (*)    Glucose, UA 250 (*)    Bilirubin Urine SMALL (*)    Ketones, ur 15 (*)    Protein, ur 30 (*)    All other components within normal limits  LACTIC ACID, PLASMA - Abnormal; Notable for the following components:   Lactic Acid, Venous 2.7 (*)    All other components within normal limits  RESP PANEL BY RT-PCR (FLU A&B, COVID) ARPGX2  URINE CULTURE  CULTURE, BLOOD (ROUTINE X 2)  CULTURE, BLOOD (ROUTINE X 2)  LACTIC ACID, PLASMA  TROPONIN I (HIGH SENSITIVITY)    EKG EKG Interpretation  Date/Time:  Tuesday March 21 2021 16:04:43 EST Ventricular Rate:  100 PR Interval:  172 QRS Duration: 148 QT Interval:  375 QTC Calculation: 484 R Axis:   87 Text Interpretation: Sinus tachycardia  LBBB with ST elevations, does not meet sgarbossa criteria for STEMI equivalent, consider clinical correlation Confirmed by Octaviano Glow [phone]) on 03/21/2021 4:17:53 PM  Radiology CT ABDOMEN PELVIS W CONTRAST  Result Date: 03/21/2021 CLINICAL DATA:  LUQ abdominal pain History of previous Whipple surgery, left upper quadrant abdominal pain and tenderness with swelling and some altered mental status. Concern for internal herniation versus obstruction versus other. EXAM: CT ABDOMEN AND PELVIS WITH CONTRAST TECHNIQUE: Multidetector CT imaging of the abdomen and pelvis was performed using the standard protocol following bolus administration of intravenous contrast. CONTRAST:  148mL OMNIPAQUE IOHEXOL 300 MG/ML  SOLN COMPARISON:  PET CT 07/21/2012 FINDINGS: Lower chest: Bibasilar atelectasis. Hepatobiliary: No focal liver abnormality. Status post cholecystectomy. No biliary dilatation. Pneumobilia. Pancreas: Status post Whipple procedure. Remaining pancreatic parenchyma is markedly diffusely atrophic. No focal lesion. Otherwise normal pancreatic contour. No surrounding inflammatory changes. No main pancreatic ductal dilatation. Spleen: Normal in size without focal abnormality.  Adrenals/Urinary Tract: No adrenal nodule bilaterally. Bilateral kidneys enhance symmetrically. No hydronephrosis. No hydroureter. The urinary bladder is unremarkable. On delayed imaging,  there is no urothelial wall thickening and there are no filling defects in the opacified portions of the bilateral collecting systems or ureters. Stomach/Bowel: Status post Whipple procedure. Stomach is within normal limits. Focal short loop of small bowel within the right upper quadrant dilated with fecalized material measuring up to 5.5 cm adjacent to anastomosis. No evidence of large bowel wall thickening or dilatation. Appendix appears normal. Vascular/Lymphatic: No abdominal aorta or iliac aneurysm. Moderate to severe atherosclerotic plaque of the aorta and its branches. No abdominal, pelvic, or inguinal lymphadenopathy. Reproductive: Prostate is unremarkable. Other: No intraperitoneal free fluid. No intraperitoneal free gas. No organized fluid collection. Musculoskeletal: Small to moderate volume left inguinal hernia containing a portion of the urinary bladder. IMPRESSION: 1. Small to moderate volume left inguinal hernia containing a portion of the urinary bladder. Correlate with physical exam for incarceration. 2. Focal short loop of small bowel within the right upper quadrant dilated with fecalized material. Finding may represent a developing obstruction versus may be normal variant in the setting of postsurgical changes. 3.  Aortic Atherosclerosis (ICD10-I70.0). Electronically Signed   By: Iven Finn M.D.   On: 03/21/2021 16:34    Procedures .Critical Care Performed by: Wyvonnia Dusky, MD Authorized by: Wyvonnia Dusky, MD   Critical care provider statement:    Critical care time (minutes):  45   Critical care time was exclusive of:  Separately billable procedures and treating other patients   Critical care was necessary to treat or prevent imminent or life-threatening deterioration of the following  conditions:  Sepsis   Critical care was time spent personally by me on the following activities:  Ordering and performing treatments and interventions, ordering and review of laboratory studies, ordering and review of radiographic studies, pulse oximetry, review of old charts, examination of patient and evaluation of patient's response to treatment   Medications Ordered in ED Medications  piperacillin-tazobactam (ZOSYN) IVPB 3.375 g (has no administration in time range)  sodium chloride 0.9 % bolus 500 mL (500 mLs Intravenous New Bag/Given 03/21/21 1517)  fentaNYL (SUBLIMAZE) injection 50 mcg (50 mcg Intravenous Given 03/21/21 1518)  iohexol (OMNIPAQUE) 300 MG/ML solution 100 mL (100 mLs Intravenous Contrast Given 03/21/21 1544)  sodium chloride 0.9 % bolus 2,000 mL (1,000 mLs Intravenous New Bag/Given 03/21/21 1730)  piperacillin-tazobactam (ZOSYN) IVPB 3.375 g (3.375 g Intravenous New Bag/Given 03/21/21 1616)    ED Course  I have reviewed the triage vital signs and the nursing notes.  Pertinent labs & imaging results that were available during my care of the patient were reviewed by me and considered in my medical decision making (see chart for details).  Patient is here with abdominal pain and distention for 2 days, generalized fatigue and weakness.  Differential includes bowel obstruction versus infection versus sepsis versus colitis versus other.  Patient's medical work-up was reviewed and interpreted by myself including his blood test, EKG, and imaging.  This was notable for leukocytosis and elevated lactate, which did show some minor improvement after 30 cc/kg fluid bolus per sepsis protocol.  Broad-spectrum antibiotics ordered vancomycin and Zosyn.  Blood cultures will be sent.  He does appear to be dehydrated with elevated specific gravity.  This may be causing some of the hemoconcentration.  Clinically he is well-appearing otherwise, and he does not have significant abdominal discomfort or  distress or active GI symptoms to suspect mesenteric ischemia or necrosis.  CT scan of the abdomen shows possible post surgical bowel dilation versus bowel obstruction.  Clinically I  would treat this as a bowel obstruction at this point with his GI symptoms.  Patient made NPO.  We will give some fluids.  I spoke to the on-call surgeon.  We will plan for medical admission for sepsis rule out and work-up with a surgical consult.  The patient was also noted to have a transaminitis.  He has had liver enzyme elevations in the past.  Is not clear what may be causing this.  He will need a liver ultrasound in the hospital, but we are not able to complete this in the ER tonight.  Clinical Course as of 03/21/21 2347  Tue Mar 21, 2021  1642 IMPRESSION: 1. Small to moderate volume left inguinal hernia containing a portion of the urinary bladder. Correlate with physical exam for incarceration. 2. Focal short loop of small bowel within the right upper quadrant dilated with fecalized material. Finding may represent a developing obstruction versus may be normal variant in the setting of postsurgical changes. 3.  Aortic Atherosclerosis (ICD10-I70.0). [MT]  1719 Troponin I (High Sensitivity): 12 [MT]  2585 Patient's EKG does show a left bundle branch block, which he says is a new diagnosis for him.  Per my interpretation his EKG, there are some anterior lead ST depressions that do not meet Sgorbossa criteria for STEMI equivalent, and no reciprocal changes. He denies any chest pain or pressure or any known cardiac history, but has not had a cardiac work-up.  His initial troponin was 12.  He will need to repeat troponin, but this does not appear to be ACS at this time. [MT]  2778 Paged Dr Kae Heller gen surgery regarding SBO [MT]  1749 Spoke to dr Annice Needy, their team will consult on patient upon his arrival, anticipate medical admission for transaminitis, sepsis rule out [MT]  1750 Repeat abd exam without rigidity or  guarding to suggest peritonitis.  No free air visualized on CT abdomen [MT]  1829 I'm told we do not have ultrasonographer here today - pt will need RUQ while inpatient [MT]  1838 Admitted to hospitalist Dr Flossie Buffy at [facility] long [MT]  1930 Lactic Acid, Venous(!!): 2.7 [MT]  1930 Lactate now improving after fluids. [MT]    Clinical Course User Index [MT] Gilberta Peeters, Carola Rhine, MD    Final Clinical Impression(s) / ED Diagnoses Final diagnoses:  Left upper quadrant abdominal pain  Liver enzyme elevation    Rx / DC Orders ED Discharge Orders     None        Wyvonnia Dusky, MD 03/21/21 2350

## 2021-03-21 NOTE — ED Triage Notes (Signed)
Pt arrives to ED with c/o of LUQ abdominal pain that started this morning. Pt reports that he did not sleep well last night due to being uncomfortable. The pain is discomfort. He reports his BM irregular and he has not had a BM today. He has hx of whipple procedure. He reports that he feels fatigued and lethargic today.

## 2021-03-21 NOTE — Progress Notes (Signed)
Pharmacy Antibiotic Note  Robert Mercer is a 60 y.o. male admitted on 03/21/2021 with  intra-abdominal infection .  Pharmacy has been consulted for Zosyn and Vancomycin dosing.  Plan: Vancomycin 2000mg  x1 then 1250mg  IV q24h (eAUC 480, Cr 1.31, Vd 0.5) Zosyn 3.375g IV q8h (4 hour infusion). -Monitor renal function, clinical status, and antibiotic plan  Height: 5\' 9"  (175.3 cm) Weight: 97.1 kg (214 lb) IBW/kg (Calculated) : 70.7  Temp (24hrs), Avg:99.8 F (37.7 C), Min:99.8 F (37.7 C), Max:99.8 F (37.7 C)  Recent Labs  Lab 03/21/21 1425 03/21/21 1444 03/21/21 1658  WBC 18.1*  --   --   CREATININE 1.31*  --   --   LATICACIDVEN  --  2.7* 3.2*     Estimated Creatinine Clearance: 68.1 mL/min (A) (by C-G formula based on SCr of 1.31 mg/dL (H)).    Allergies  Allergen Reactions   Azilsartan Other (See Comments)    Possibly caused hives (as Edarbyclor) on 06/16/14   Chlorthalidone Other (See Comments)    Possibly caused hives (as Edarbyclor) on 06/16/14   Shellfish Allergy    Amlodipine Hives, Itching and Rash   Lisinopril Hives, Itching and Rash    Microbiology results: 11/8 BC >  Thank you for allowing pharmacy to be a part of this patient's care.  Joetta Manners, PharmD, [facility] Emergency Medicine Clinical Pharmacist ED RPh Phone: [place]: [phone]

## 2021-03-21 NOTE — ED Notes (Signed)
Pt vomited approximately 50 cc's of bilious vomit.

## 2021-03-22 ENCOUNTER — Encounter (HOSPITAL_COMMUNITY): Payer: Self-pay | Admitting: Family Medicine

## 2021-03-22 ENCOUNTER — Observation Stay (HOSPITAL_COMMUNITY): Payer: BC Managed Care – PPO

## 2021-03-22 DIAGNOSIS — I447 Left bundle-branch block, unspecified: Secondary | ICD-10-CM | POA: Diagnosis present

## 2021-03-22 DIAGNOSIS — E871 Hypo-osmolality and hyponatremia: Secondary | ICD-10-CM | POA: Diagnosis present

## 2021-03-22 DIAGNOSIS — Z8572 Personal history of non-Hodgkin lymphomas: Secondary | ICD-10-CM | POA: Diagnosis not present

## 2021-03-22 DIAGNOSIS — E109 Type 1 diabetes mellitus without complications: Secondary | ICD-10-CM

## 2021-03-22 DIAGNOSIS — Z9049 Acquired absence of other specified parts of digestive tract: Secondary | ICD-10-CM | POA: Diagnosis not present

## 2021-03-22 DIAGNOSIS — R945 Abnormal results of liver function studies: Secondary | ICD-10-CM | POA: Diagnosis not present

## 2021-03-22 DIAGNOSIS — Z9641 Presence of insulin pump (external) (internal): Secondary | ICD-10-CM | POA: Diagnosis present

## 2021-03-22 DIAGNOSIS — Z90410 Acquired total absence of pancreas: Secondary | ICD-10-CM | POA: Diagnosis not present

## 2021-03-22 DIAGNOSIS — K56609 Unspecified intestinal obstruction, unspecified as to partial versus complete obstruction: Secondary | ICD-10-CM

## 2021-03-22 DIAGNOSIS — Z90411 Acquired partial absence of pancreas: Secondary | ICD-10-CM | POA: Diagnosis not present

## 2021-03-22 DIAGNOSIS — K4090 Unilateral inguinal hernia, without obstruction or gangrene, not specified as recurrent: Secondary | ICD-10-CM | POA: Diagnosis present

## 2021-03-22 DIAGNOSIS — R748 Abnormal levels of other serum enzymes: Secondary | ICD-10-CM | POA: Diagnosis not present

## 2021-03-22 DIAGNOSIS — N179 Acute kidney failure, unspecified: Secondary | ICD-10-CM

## 2021-03-22 DIAGNOSIS — I10 Essential (primary) hypertension: Secondary | ICD-10-CM | POA: Diagnosis present

## 2021-03-22 DIAGNOSIS — E669 Obesity, unspecified: Secondary | ICD-10-CM | POA: Diagnosis present

## 2021-03-22 DIAGNOSIS — A4189 Other specified sepsis: Secondary | ICD-10-CM | POA: Diagnosis present

## 2021-03-22 DIAGNOSIS — R9431 Abnormal electrocardiogram [ECG] [EKG]: Secondary | ICD-10-CM

## 2021-03-22 DIAGNOSIS — E872 Acidosis, unspecified: Secondary | ICD-10-CM | POA: Diagnosis present

## 2021-03-22 DIAGNOSIS — Z6831 Body mass index (BMI) 31.0-31.9, adult: Secondary | ICD-10-CM | POA: Diagnosis not present

## 2021-03-22 DIAGNOSIS — B961 Klebsiella pneumoniae [K. pneumoniae] as the cause of diseases classified elsewhere: Secondary | ICD-10-CM | POA: Diagnosis present

## 2021-03-22 DIAGNOSIS — K219 Gastro-esophageal reflux disease without esophagitis: Secondary | ICD-10-CM | POA: Diagnosis present

## 2021-03-22 DIAGNOSIS — Z20822 Contact with and (suspected) exposure to covid-19: Secondary | ICD-10-CM | POA: Diagnosis present

## 2021-03-22 DIAGNOSIS — Z923 Personal history of irradiation: Secondary | ICD-10-CM | POA: Diagnosis not present

## 2021-03-22 DIAGNOSIS — A4151 Sepsis due to Escherichia coli [E. coli]: Secondary | ICD-10-CM | POA: Diagnosis present

## 2021-03-22 DIAGNOSIS — E10319 Type 1 diabetes mellitus with unspecified diabetic retinopathy without macular edema: Secondary | ICD-10-CM | POA: Diagnosis present

## 2021-03-22 DIAGNOSIS — I700 Atherosclerosis of aorta: Secondary | ICD-10-CM | POA: Diagnosis present

## 2021-03-22 DIAGNOSIS — R109 Unspecified abdominal pain: Secondary | ICD-10-CM | POA: Diagnosis present

## 2021-03-22 DIAGNOSIS — Z9221 Personal history of antineoplastic chemotherapy: Secondary | ICD-10-CM | POA: Diagnosis not present

## 2021-03-22 DIAGNOSIS — R1012 Left upper quadrant pain: Secondary | ICD-10-CM | POA: Diagnosis present

## 2021-03-22 DIAGNOSIS — K56600 Partial intestinal obstruction, unspecified as to cause: Secondary | ICD-10-CM | POA: Diagnosis present

## 2021-03-22 DIAGNOSIS — G4733 Obstructive sleep apnea (adult) (pediatric): Secondary | ICD-10-CM | POA: Diagnosis present

## 2021-03-22 DIAGNOSIS — R6520 Severe sepsis without septic shock: Secondary | ICD-10-CM | POA: Diagnosis present

## 2021-03-22 LAB — CBC
HCT: 36 % — ABNORMAL LOW (ref 39.0–52.0)
Hemoglobin: 12.7 g/dL — ABNORMAL LOW (ref 13.0–17.0)
MCH: 29.3 pg (ref 26.0–34.0)
MCHC: 35.3 g/dL (ref 30.0–36.0)
MCV: 83.1 fL (ref 80.0–100.0)
Platelets: 165 10*3/uL (ref 150–400)
RBC: 4.33 MIL/uL (ref 4.22–5.81)
RDW: 13.8 % (ref 11.5–15.5)
WBC: 15.5 10*3/uL — ABNORMAL HIGH (ref 4.0–10.5)
nRBC: 0 % (ref 0.0–0.2)

## 2021-03-22 LAB — BLOOD CULTURE ID PANEL (REFLEXED) - BCID2

## 2021-03-22 LAB — COMPREHENSIVE METABOLIC PANEL
ALT: 346 U/L — ABNORMAL HIGH (ref 0–44)
AST: 271 U/L — ABNORMAL HIGH (ref 15–41)
Albumin: 2.6 g/dL — ABNORMAL LOW (ref 3.5–5.0)
Alkaline Phosphatase: 203 U/L — ABNORMAL HIGH (ref 38–126)
Anion gap: 7 (ref 5–15)
BUN: 14 mg/dL (ref 8–23)
CO2: 23 mmol/L (ref 22–32)
Calcium: 8 mg/dL — ABNORMAL LOW (ref 8.9–10.3)
Chloride: 99 mmol/L (ref 98–111)
Creatinine, Ser: 1.42 mg/dL — ABNORMAL HIGH (ref 0.61–1.24)
GFR, Estimated: 56 mL/min — ABNORMAL LOW (ref >60)
Glucose, Bld: 137 mg/dL — ABNORMAL HIGH (ref 70–99)
Potassium: 3.9 mmol/L (ref 3.5–5.1)
Sodium: 129 mmol/L — ABNORMAL LOW (ref 135–145)
Total Bilirubin: 4 mg/dL — ABNORMAL HIGH (ref 0.3–1.2)
Total Protein: 5.5 g/dL — ABNORMAL LOW (ref 6.5–8.1)

## 2021-03-22 LAB — HIV ANTIBODY (ROUTINE TESTING W REFLEX): HIV Screen 4th Generation wRfx: NONREACTIVE

## 2021-03-22 LAB — PROCALCITONIN: Procalcitonin: 25.41 ng/mL

## 2021-03-22 MED ORDER — PANCRELIPASE (LIP-PROT-AMYL) 36000-114000 UNITS PO CPEP
72000.0000 [IU] | ORAL_CAPSULE | Freq: Three times a day (TID) | ORAL | Status: DC | PRN
  Filled 2021-03-22: qty 2

## 2021-03-22 MED ORDER — ASPIRIN EC 81 MG PO TBEC
81.0000 mg | DELAYED_RELEASE_TABLET | Freq: Every day | ORAL | Status: DC
  Administered 2021-03-22 – 2021-03-25 (×4): 81 mg via ORAL
  Filled 2021-03-22 (×4): qty 1

## 2021-03-22 MED ORDER — PANCRELIPASE (LIP-PROT-AMYL) 12000-38000 UNITS PO CPEP
120000.0000 [IU] | ORAL_CAPSULE | Freq: Three times a day (TID) | ORAL | Status: DC
  Administered 2021-03-22 – 2021-03-24 (×4): 120000 [IU] via ORAL
  Filled 2021-03-22 (×8): qty 10

## 2021-03-22 MED ORDER — NEBIVOLOL HCL 10 MG PO TABS: 10.0000 mg | ORAL_TABLET | Freq: Every day | ORAL | Status: DC

## 2021-03-22 MED ORDER — HYDROMORPHONE HCL 1 MG/ML IJ SOLN: 1.0000 mg | INTRAMUSCULAR | Status: DC | PRN

## 2021-03-22 MED ORDER — TRAMADOL HCL 50 MG PO TABS: 50.0000 mg | ORAL_TABLET | Freq: Four times a day (QID) | ORAL | Status: DC | PRN

## 2021-03-22 MED ORDER — INSULIN PUMP
Freq: Three times a day (TID) | SUBCUTANEOUS | Status: DC
  Filled 2021-03-22: qty 1

## 2021-03-22 MED ORDER — IRBESARTAN 300 MG PO TABS
300.0000 mg | ORAL_TABLET | Freq: Every day | ORAL | Status: DC
  Filled 2021-03-22: qty 1

## 2021-03-22 MED ORDER — LACTATED RINGERS IV SOLN: INTRAVENOUS | Status: DC

## 2021-03-22 MED ORDER — ENOXAPARIN SODIUM 40 MG/0.4ML IJ SOSY
40.0000 mg | PREFILLED_SYRINGE | INTRAMUSCULAR | Status: DC
  Administered 2021-03-22 – 2021-03-25 (×4): 40 mg via SUBCUTANEOUS
  Filled 2021-03-22 (×4): qty 0.4

## 2021-03-22 MED ORDER — PIPERACILLIN-TAZOBACTAM 3.375 G IVPB: 3.3750 g | Freq: Three times a day (TID) | INTRAVENOUS | Status: DC

## 2021-03-22 NOTE — H&P (Signed)
History and Physical    Robert Mercer DJS:[id number] DOB: Jun 16, 1960 DOA: 03/21/2021  PCP: Haywood Pao, MD   Patient coming from: Home  Chief Complaint: abdominal pain, fatigue  HPI: Robert Mercer is a 60 y.o. male with medical history significant for  B cell lymphoma in remission, DMT1, HTN, HLD, inguinal hernia s/p repair, s/p Whipple Procedure with cholecystectomy who presents for evaluation of generalized fatigue, abdominal pain, nausea and vomiting.  He reports he was in his normal state of health until yesterday morning when he developed generalized fatigue and felt like he had no energy.  He developed abdominal pain in the upper abdominal region associated with nausea and vomiting.  States he denies any lightheadedness and he did not have any syncopal episodes.  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  He reports that he felt "dehydrated".  After going home and did not feel better he decided to go to the emergency room for evaluation.  States he has had mild left upper quadrant abdominal pain for the last few weeks that has been intermittent but early yesterday morning it became more severe and persistent.  Developed while he was laying in bed and he did not have any aggravating activities that led to it.  He reports having a small bowel movement yesterday but he reports it was his normal volume for him.  Is having flatus.  He has not had any fevers or chills.  He has no known sick contacts.  He has no change in diet and denies any recent raw or undercooked meat ingestion.  ED Course: The emergency room patient had tachycardia with a low-grade temperature of 100 degrees.  He had intermittent tachypnea with a stable blood pressure.  White blood cell count was elevated at 18,000 elevated lactic acid level of 2.7 and 3.2 in the emergency room.  CT scan was obtained which showed possible getting of an intestinal obstruction  versus possible colitis.  He was started on broad-spectrum antibiotics and sent to [facility] for further evaluation.  Case was discussed with general surgery, Dr. Windle Guard, who will see patient in consultation this morning.  Lab work reveals hemoglobin 14.1 hematocrit 41.3 platelets 228,000 sodium 128 potassium 4.8 chloride 94 bicarb 21 creatinine 1.31 BUN 15 alkaline phosphatase 365 AST 623 ALT 546 total bilirubin 3.1 albumin 3.9 troponin 12 lactic acid 2.7 and then 3.2  Review of Systems:  General: Reports fatigue. Denies fever, chills, weight loss, night sweats.  Denies dizziness. Reports decreased appetite HENT: Denies head trauma, headache, denies change in hearing, tinnitus.  Denies nasal congestion or bleeding.  Denies sore throat.  Denies difficulty swallowing Eyes: Denies blurry vision, pain in eye, drainage.  Denies discoloration of eyes. Neck: Denies pain.  Denies swelling.  Denies pain with movement. Cardiovascular: Denies chest pain, palpitations.  Denies edema.  Denies orthopnea Respiratory: Denies shortness of breath, cough.  Denies wheezing.  Denies sputum production Gastrointestinal: Reports abdominal pain, nausea&vomiting. Denies diarrhea.  Denies melena.  Denies hematemesis. Musculoskeletal: Denies limitation of movement.  Denies deformity or swelling. Denies arthralgias or myalgias. Genitourinary: Denies pelvic pain.  Denies urinary frequency or hesitancy.  Denies dysuria.  Skin: Denies rash.  Denies petechiae, purpura, ecchymosis. Neurological: Denies syncope. Denies seizure activity. Denies paresthesia. Denies slurred speech, drooping face. Denies visual change. Psychiatric: Denies depression, anxiety.  Denies hallucinations.  Past Medical History:  Diagnosis Date  Arthritis    "left ankle; I broke it in high school" (09/03/2016)   Childhood asthma    Diabetes mellitus without complication (HCC)    Novolog insulin pump.Average fasting blood sugar runs 145    Diabetic retinopathy associated with type 1 diabetes mellitus ([place])    Diffuse large B cell lymphoma (HCC)    Dyslipidemia    GERD (gastroesophageal reflux disease)    takes Omeprazole daily   History of blood transfusion 2003   related to Whipple; developed itching/blotching X 1; transfused later also related to the Whipple; no problems" (09/03/2016)   Hypertension    takes Bystolic and Diovan daily   Joint pain    Nocturia    Pancreatic lymphoma ([place]) 02/14/2002   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 Valere Dross   Sleep apnea    mild to moderate; "couldn't tolerate CPAP; I wear an appliance" (09/03/2016)   Vitamin B12 deficiency    takes B12 every 30 days.   Vitamin D deficiency    takes Vitamin D daily    Past Surgical History:  Procedure Laterality Date   CHOLECYSTECTOMY  2003   COLONOSCOPY  05/2007   ESOPHAGOGASTRODUODENOSCOPY     EUS N/A 07/23/2012   Procedure: FULL UPPER ENDOSCOPIC ULTRASOUND (EUS) RADIAL;  Surgeon: Arta Silence, MD;  Location: [facility];  Service: Endoscopy;  Laterality: N/A;   HERNIA REPAIR     INSERTION OF MESH N/A 09/03/2016   Procedure: INSERTION OF MESH;  Surgeon: Armandina Gemma, MD;  Location: [place];  Service: General;  Laterality: N/A;   LAPAROSCOPIC INCISIONAL / UMBILICAL / [place]     w/mesh/notes 09/03/2016   percuteaneous puncture  X 3   to place stent in one of my ducts related to scar tissue from [place] N/A 09/03/2016   Procedure: [place];  Surgeon: Armandina Gemma, MD;  Location: [place];  Service: General;  Laterality: N/A;   [place] PROCEDURE  2003   pancreatic lymphoma   WISDOM TOOTH EXTRACTION      Social History  reports that he has never smoked. He has never used smokeless tobacco. He reports current alcohol use of about 6.0 standard drinks per week. He  reports that he does not use drugs.  Allergies  Allergen Reactions   Azilsartan Other (See Comments)    Possibly caused hives (as Edarbyclor) on 06/16/14   Chlorthalidone Other (See Comments)    Possibly caused hives (as Edarbyclor) on 06/16/14   Shellfish Allergy    Amlodipine Hives, Itching and Rash   Lisinopril Hives, Itching and Rash    History reviewed. No pertinent family history.   Prior to Admission medications   Medication Sig Start Date End Date Taking? Authorizing Provider  aspirin 81 MG tablet Take 81 mg by mouth daily.   Yes [provider]  BYSTOLIC 10 MG tablet Take 10 mg by mouth daily. 08/08/16  Yes [provider]  CREON 24000-76000 units CPEP Take 5-6 capsules by mouth 3 (three) times daily with meals. Take 3 capsules with snacks 08/18/16  Yes [provider]  cyanocobalamin (,VITAMIN B-12,) 1000 MCG/ML injection Inject 1,000 mcg into the muscle every 30 (thirty) days.    Yes [provider]  glucosamine-chondroitin 500-400 MG tablet Take 1 tablet by mouth daily.    Yes [provider]  NOVOLOG 100  UNIT/ML injection Mini med insulin pump 07/06/12  Yes [provider]  valsartan (DIOVAN) 320 MG tablet Take 320 mg by mouth daily. 08/08/16  Yes [provider]  Vitamin D, Ergocalciferol, (DRISDOL) 50000 UNITS CAPS Take 50,000 Units by mouth every Monday.    Yes [provider]  esomeprazole (NEXIUM) 20 MG capsule Take 20 mg by mouth daily at 12 noon.    [provider]  HYDROcodone-acetaminophen (NORCO/VICODIN) 5-325 MG tablet Take 1-2 tablets by mouth every 4 (four) hours as needed for moderate pain. Patient not taking: Reported on 03/21/2021 09/05/16   Armandina Gemma, MD    Physical Exam: Vitals:   03/21/21 1737 03/21/21 1850 03/21/21 2352 03/22/21 0328  BP: (!) 145/58 (!) 144/64 (!) 115/40 (!) 146/69  Pulse: 97 90 70 89  Resp: (!) 23 (!) 22 (!) 21 16  Temp:   100 F (37.8 C) 98 F (36.7 C)   TempSrc:   Oral Oral  SpO2: 97% 94% 96% 96%  Weight:      Height:        Constitutional: NAD, calm, comfortable Vitals:   03/21/21 1737 03/21/21 1850 03/21/21 2352 03/22/21 0328  BP: (!) 145/58 (!) 144/64 (!) 115/40 (!) 146/69  Pulse: 97 90 70 89  Resp: (!) 23 (!) 22 (!) 21 16  Temp:   100 F (37.8 C) 98 F (36.7 C)  TempSrc:   Oral Oral  SpO2: 97% 94% 96% 96%  Weight:      Height:       General: WDWN, Alert and oriented x3.  Eyes: EOMI, PERRL, conjunctivae normal.  Sclera nonicteric HENT:  [place]/AT, external ears normal.  Nares patent without epistasis.  Mucous membranes are dry. Posterior pharynx clear Neck: Soft, normal range of motion, supple, no masses, no thyromegaly.  Trachea midline Respiratory: clear to auscultation bilaterally, no wheezing, no crackles. Normal respiratory effort. No accessory muscle use.  Cardiovascular: Regular rate and rhythm, no murmurs / rubs / gallops. No extremity edema. 2+ pedal pulses.  Abdomen: Soft, Upper abdominal tenderness, nondistended, healed midline surgical scars. no rebound or guarding.  No masses palpated. Obese. Bowel sounds normoactive Musculoskeletal: FROM. no cyanosis. No joint deformity upper and lower extremities. Normal muscle tone.  Skin: Warm, dry, intact no rashes, lesions, ulcers. No induration Neurologic: CN 2-12 grossly intact.  Normal speech.  Sensation intact, patella. Strength 5/5 in all extremities.   Psychiatric: Normal judgment and insight.  Normal mood.    Labs on Admission: I have personally reviewed following labs and imaging studies  CBC: Recent Labs  Lab 03/21/21 1425  WBC 18.1*  HGB 14.1  HCT 41.3  MCV 83.3  PLT 818    Basic Metabolic Panel: Recent Labs  Lab 03/21/21 1425  NA 128*  K 4.8  CL 94*  CO2 21*  GLUCOSE 190*  BUN 15  CREATININE 1.31*  CALCIUM 9.3    GFR: Estimated Creatinine Clearance: 68.1 mL/min (A) (by C-G formula based on SCr of 1.31 mg/dL (H)).  Liver Function  Tests: Recent Labs  Lab 03/21/21 1425  AST 623*  ALT 546*  ALKPHOS 365*  BILITOT 3.1*  PROT 6.7  ALBUMIN 3.9    Urine analysis:    Component Value Date/Time   COLORURINE YELLOW 03/21/2021 1658   APPEARANCEUR CLEAR 03/21/2021 1658   LABSPEC 1.037 (H) 03/21/2021 1658   PHURINE 8.5 (H) 03/21/2021 1658   GLUCOSEU 250 (A) 03/21/2021 1658   HGBUR NEGATIVE 03/21/2021 1658   BILIRUBINUR SMALL (A) 03/21/2021  [place] (A) 03/21/2021 1658   PROTEINUR 30 (A) 03/21/2021 1658   NITRITE NEGATIVE 03/21/2021 1658   LEUKOCYTESUR NEGATIVE 03/21/2021 1658    Radiological Exams on Admission: CT ABDOMEN PELVIS W CONTRAST  Result Date: 03/21/2021 CLINICAL DATA:  LUQ abdominal pain History of previous Whipple surgery, left upper quadrant abdominal pain and tenderness with swelling and some altered mental status. Concern for internal herniation versus obstruction versus other. EXAM: CT ABDOMEN AND PELVIS WITH CONTRAST TECHNIQUE: Multidetector CT imaging of the abdomen and pelvis was performed using the standard protocol following bolus administration of intravenous contrast. CONTRAST:  12mL OMNIPAQUE IOHEXOL 300 MG/ML  SOLN COMPARISON:  PET CT 07/21/2012 FINDINGS: Lower chest: Bibasilar atelectasis. Hepatobiliary: No focal liver abnormality. Status post cholecystectomy. No biliary dilatation. Pneumobilia. Pancreas: Status post Whipple procedure. Remaining pancreatic parenchyma is markedly diffusely atrophic. No focal lesion. Otherwise normal pancreatic contour. No surrounding inflammatory changes. No main pancreatic ductal dilatation. Spleen: Normal in size without focal abnormality. Adrenals/Urinary Tract: No adrenal nodule bilaterally. Bilateral kidneys enhance symmetrically. No hydronephrosis. No hydroureter. The urinary bladder is unremarkable. On delayed imaging, there is no urothelial wall thickening and there are no filling defects in the opacified portions of the bilateral collecting  systems or ureters. Stomach/Bowel: Status post Whipple procedure. Stomach is within normal limits. Focal short loop of small bowel within the right upper quadrant dilated with fecalized material measuring up to 5.5 cm adjacent to anastomosis. No evidence of large bowel wall thickening or dilatation. Appendix appears normal. Vascular/Lymphatic: No abdominal aorta or iliac aneurysm. Moderate to severe atherosclerotic plaque of the aorta and its branches. No abdominal, pelvic, or inguinal lymphadenopathy. Reproductive: Prostate is unremarkable. Other: No intraperitoneal free fluid. No intraperitoneal free gas. No organized fluid collection. Musculoskeletal: Small to moderate volume left inguinal hernia containing a portion of the urinary bladder. IMPRESSION: 1. Small to moderate volume left inguinal hernia containing a portion of the urinary bladder. Correlate with physical exam for incarceration. 2. Focal short loop of small bowel within the right upper quadrant dilated with fecalized material. Finding may represent a developing obstruction versus may be normal variant in the setting of postsurgical changes. 3.  Aortic Atherosclerosis (ICD10-I70.0). Electronically Signed   By: Iven Finn M.D.   On: 03/21/2021 16:34   DG Chest Portable 1 View  Result Date: 03/21/2021 CLINICAL DATA:  Infection evaluation. EXAM: PORTABLE CHEST 1 VIEW COMPARISON:  Chest CT 07/21/2012 FINDINGS: The cardiac silhouette is normal in size. There are mild interstitial densities bilaterally most notable in the peripheral left mid and lower lung. No consolidative airspace opacity, pleural effusion, or pneumothorax is identified. No acute osseous abnormality is seen. IMPRESSION: Mild interstitial densities in the left greater than right lungs which could reflect atypical/viral infection or early edema. Electronically Signed   By: Logan Bores M.D.   On: 03/21/2021 19:15   US Abdomen Limited RUQ (LIVER/GB)  Result Date:  03/22/2021 CLINICAL DATA:  Elevated liver enzymes. Status post Whipple procedure. EXAM: ULTRASOUND ABDOMEN LIMITED RIGHT UPPER QUADRANT COMPARISON:  03/21/2021. FINDINGS: Gallbladder: Surgically absent. Common bile duct: Diameter: 2.7 mm Liver: No focal lesion identified. Within normal limits in parenchymal echogenicity. Portal vein is patent on color Doppler imaging with normal direction of blood flow towards the liver. Other: No free fluid. IMPRESSION: 1. No acute abnormality. 2. Status post cholecystectomy. Electronically Signed   By: Brett Fairy M.D.   On: 03/22/2021 04:17    EKG: Independently reviewed.  EKG shows sinus tachycardia with left  bundle branch block.  No acute ST elevation or depression.  QTc 484  Assessment/Plan Principal Problem:   Intestinal obstruction  Mr. Ibe is placed on telemetry floor for observation with possible obstruction of intestine per CT scan. Reading is not definitive for obstruction. Surgery consulted and will see pt in am.  No abdominal pain, nausea or vomiting at this time so will hold off on NGT for now. If develops vomiting will need NGT placed.  IVF hydration with LR at 100 ml/hr Dilaudid ordered as needed for pain U/S abdomen obtained   Active Problems:   Sepsis  Started on Vancomycin and zosyn in ER with possible colitis with elevated lactic acid level,  elevated WBC, AKI and tachycardia.  Cultures obtained in ER and will monitor culture results.     AKI (acute kidney injury)  IVF hydration with LR. Recheck renal function and electrolytes in am    Liver enzyme elevation Monitor LFTs. Levels increased over baseline     Diabetes mellitus type 1  Has insulin pump. Continue to monitor blood sugar and insulin as needed for glycemic control    Hyponatremia Mild. Monitor electrolytes. Check urine sodium and serum osmalarity.     Abdominal pain Pain control provided as needed. Surgery consulted    Prolonged QT interval Avoid medications which  could further prolong QT interval.  DVT prophylaxis: Lovenox for DVT prophylaxis  Code Status:   Full Code  Family Communication:  Diagnosis and plan discussed with patient.  He verbalized understanding agrees with plan.  Questions answered.  Further recommendations to follow as clinical indicated Disposition Plan:   Patient is from:  Home  Anticipated DC to:  Home  Anticipated DC date:  Anticipate 2 midnight stay  Consults called:  General Surgery, Dr. Kae Heller was consulted by ER physician and notified of patient arrival Admission status:  Inpatient   Yevonne Aline Exzavier Ruderman MD [facility]  How to contact the [facility] Attending or Consulting provider [place] or covering provider during after hours [place], for this patient?   Check the care team in [facility] and look for a) attending/consulting [facility] provider listed and b) the [facility] team listed Log into [email or web address] and use [place]'s universal password to access. If you do not have the password, please contact the hospital operator. Locate the [facility] provider you are looking for under [facility] and page to a number that you can be directly reached. If you still have difficulty reaching the provider, please page the [facility] (Director on Call) for the Hospitalists listed on amion for assistance.  03/22/2021, 4:40 AM

## 2021-03-22 NOTE — Progress Notes (Addendum)
Patient seen and examined, admitted earlier this morning by Dr. 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 -In the ED he was found to be febrile, tachycardic with leukocytosis, lactic acidosis. -CT abdomen pelvis noted focal dilated loop of small bowel and small to moderate volume left inguinal hernia containing a portion of the urinary bladder.  Transient SBO Sepsis E. coli and Klebsiella bacteremia -Clinically suspect transient SBO yesterday with resultant translocation of bacteria, sepsis and bacteremia -Abdominal exam is benign today, symptoms have improved as well -General surgery following, starting clears -Continue IV Zosyn and IV fluids today, discontinue vancomycin -Monitor clinical course, transition to oral antibiotics in 48 hours if stable  Left inguinal hernia containing urinary bladder -History of incomplete bladder emptying and urinary frequency for several months -Will need repeat hernia repair per [facility]/Urology down the road  Type 1 diabetes mellitus -On insulin pump, this was continued, protocol ordered -Diabetes coordinator  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 -Continue pancreatic enzymes  Mild AKI -Secondary to above, continue IV fluids today, hold ARB  Abnormal LFTs -Likely secondary to sepsis, status post cholecystectomy -No biliary dilation noted on CT -continue to trend  Domenic Polite, MD

## 2021-03-22 NOTE — Progress Notes (Signed)
  Mobility Specialist Criteria Algorithm Info.  Mobility Team: HOB elevated: Activity: Ambulated in hall Range of motion: Active; All extremities Level of assistance: Contact guard assist, steadying assist Assistive device: Other (Comment) (IV Pole) Distance ambulated (ft): 200 ft Mobility response: Tolerated well Bed Position: Semi-fowlers  Patient received in bed, less lethargic but still very tired needing stimuli to stay awake for conversation. Eager to participate in mobility despite exhaustion. Did report near-syncope w/fall in shower recently that has caused pain in RLE. Has been less ambulatory since fall. Was able to get to EOB independently but required min A to stand. Ambulated in hallway at min guard with slow gait using IV pole to steady. Returned to room without incident. Tolerated ambulation well without complaint and was lying in bed with all needs met.  03/22/2021 11:35 AM

## 2021-03-22 NOTE — Progress Notes (Signed)
PHARMACY - PHYSICIAN COMMUNICATION CRITICAL VALUE ALERT - BLOOD CULTURE IDENTIFICATION (BCID)  [name] is an 60 y.o. male who presented to [facility] on 03/21/2021 with a chief complaint of abdominal pain  Assessment:  WBC elevated, renal function good, Tmax 100  Name of physician (or Provider) Contacted: Dr Tonie Griffith  Current antibiotics: Vancomycin/Zosyn  Changes to prescribed antibiotics recommended:  Cont Zosyn DC Vancomycin  Results for orders placed or performed during the hospital encounter of 03/21/21  Blood Culture ID Panel (Reflexed) (Collected: 03/21/2021  2:44 PM)  Result Value Ref Range   Enterococcus faecalis NOT DETECTED NOT DETECTED   Enterococcus Faecium NOT DETECTED NOT DETECTED   Listeria monocytogenes NOT DETECTED NOT DETECTED   Staphylococcus species NOT DETECTED NOT DETECTED   Staphylococcus aureus (BCID) NOT DETECTED NOT DETECTED   Staphylococcus epidermidis NOT DETECTED NOT DETECTED   Staphylococcus lugdunensis NOT DETECTED NOT DETECTED   Streptococcus species NOT DETECTED NOT DETECTED   Streptococcus agalactiae NOT DETECTED NOT DETECTED   Streptococcus pneumoniae NOT DETECTED NOT DETECTED   Streptococcus pyogenes NOT DETECTED NOT DETECTED   A.calcoaceticus-baumannii NOT DETECTED NOT DETECTED   Bacteroides fragilis NOT DETECTED NOT DETECTED   Enterobacterales DETECTED (A) NOT DETECTED   Enterobacter cloacae complex NOT DETECTED NOT DETECTED   Escherichia coli DETECTED (A) NOT DETECTED   Klebsiella aerogenes NOT DETECTED NOT DETECTED   Klebsiella oxytoca DETECTED (A) NOT DETECTED   Klebsiella pneumoniae NOT DETECTED NOT DETECTED   Proteus species NOT DETECTED NOT DETECTED   Salmonella species NOT DETECTED NOT DETECTED   Serratia marcescens NOT DETECTED NOT DETECTED   Haemophilus influenzae NOT DETECTED NOT DETECTED   Neisseria meningitidis NOT DETECTED NOT DETECTED   Pseudomonas aeruginosa NOT DETECTED NOT DETECTED   Stenotrophomonas maltophilia  NOT DETECTED NOT DETECTED   Candida albicans NOT DETECTED NOT DETECTED   Candida auris NOT DETECTED NOT DETECTED   Candida glabrata NOT DETECTED NOT DETECTED   Candida krusei NOT DETECTED NOT DETECTED   Candida parapsilosis NOT DETECTED NOT DETECTED   Candida tropicalis NOT DETECTED NOT DETECTED   Cryptococcus neoformans/gattii NOT DETECTED NOT DETECTED   CTX-M ESBL NOT DETECTED NOT DETECTED   Carbapenem resistance IMP NOT DETECTED NOT DETECTED   Carbapenem resistance KPC NOT DETECTED NOT DETECTED   Carbapenem resistance NDM NOT DETECTED NOT DETECTED   Carbapenem resist OXA 48 LIKE NOT DETECTED NOT DETECTED   Carbapenem resistance VIM NOT DETECTED NOT DETECTED    Narda Bonds 03/22/2021  5:04 AM

## 2021-03-22 NOTE — Progress Notes (Signed)
Inpatient Diabetes Program Recommendations  AACE/ADA: New Consensus Statement on Inpatient Glycemic Control (2015)  Target Ranges:  Prepandial:   less than 140 mg/dL      Peak postprandial:   less than 180 mg/dL (1-2 hours)      Critically ill patients:  140 - 180 mg/dL   Lab Results  Component Value Date   GLUCAP 156 (H) 09/05/2016   HGBA1C 7.7 (H) 08/24/2016    Review of Glycemic Control Results for Robert Mercer, Robert Mercer (MRN [id number]) as of 03/22/2021 09:40  Ref. Range 03/22/2021 05:55  Glucose Latest Ref Range: 70 - 99 mg/dL 137 (H)   Diabetes history: Type 1 DM Outpatient Diabetes medications: Novolog per Medtronic 670G Current orders for Inpatient glycemic control: none  Inpatient Diabetes Program Recommendations:    Patient is followed by Dr Grandville Silos, outpatient endocrinology with last appointment 02/17/21. Patient is post Whipple procedure 2003.  Current insulin pump settings are as follows:  Basal insulin  0000-0300= 0.5 units/hr 0301-0000 = 1.0 units/hr Total daily basal insulin: 22.50 units/24 hours  Carb Coverage 0601-2200 1:9 1 unit for every 9 grams of carbohydrates 2201-0600 1:15 1 unit for every 15 grams of carbohydrates  Insulin Sensitivity 1:36 1 unit drops blood glucose 36 mg/dl  Verified with RN patient has Medtronic device on and applied. Last A1C per endo was 6.9%.  Consider adding insulin pump order set Q4H until diet order placed.   NURSING: Once insulin pump order set is ordered please print off the Patient insulin pump contract and flow sheet. The insulin pump contract should be signed by the patient and then placed in the chart. The patient insulin pump flow sheet will be completed by the patient at the bedside and the RN caring for the patient will use the patient's flow sheet to document in the [facility]. RN will need to complete the Nursing Insulin Pump Flowsheet at least once a shift. Patient will need to keep extra insulin pump supplies at the bedside at  all times.   Secure chat sent to MD.   Thanks, Bronson Curb, MSN, RNC-OB Diabetes Coordinator [phone] ([phone])

## 2021-03-22 NOTE — Consult Note (Signed)
Consult Note  Robert Mercer 02-Jun-1960  [id number].    Requesting MD: Harrold Donath, MD Chief Complaint/Reason for Consult: abdominal pain   HPI:  Patient is a 60 year old male who presented to [facility] with LUQ abdominal pain that started 11/7. Onset was gradual and pain described as aching and pressure like. Pain did not radiate and was constant. Patient reported associated fatigue and nausea. He denied vomiting, fever chills. 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 He also reports urine appeared concentrated and he has had some symptoms of incomplete emptying for a while now. He now reports that abdominal pain and nausea have resolved. Other previous abdominal surgery includes a ventral hernia repair with mesh in 2018. PMH otherwise significant for HTN, T1DM, OSA and GERD. Patient works as an Chief Financial Officer.   ROS: Review of Systems  Constitutional:  Positive for malaise/fatigue. Negative for chills and fever.  Respiratory:  Negative for shortness of breath and wheezing.   Cardiovascular:  Negative for chest pain and palpitations.  Gastrointestinal:  Positive for abdominal pain, diarrhea and nausea. Negative for blood in stool, constipation, melena and vomiting.  Genitourinary:  Positive for frequency (incomplete emptying). Negative for dysuria and urgency.  All other systems reviewed and are negative.  History reviewed. No pertinent family history.  Past Medical History:  Diagnosis Date   Arthritis    "left ankle; I broke it in high school" (09/03/2016)   Childhood asthma    Diabetes mellitus without complication (HCC)    Novolog insulin pump.Average fasting blood sugar runs 145   Diabetic retinopathy associated with type 1 diabetes mellitus ([place])    Diffuse large B cell lymphoma (HCC)    Dyslipidemia    GERD (gastroesophageal reflux disease)    takes Omeprazole daily   History  of blood transfusion 2003   related to Whipple; developed itching/blotching X 1; transfused later also related to the Whipple; no problems" (09/03/2016)   Hypertension    takes Bystolic and Diovan daily   Joint pain    Nocturia    Pancreatic lymphoma ([place]) 02/14/2002   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 Valere Dross   Sleep apnea    mild to moderate; "couldn't tolerate CPAP; I wear an appliance" (09/03/2016)   Vitamin B12 deficiency    takes B12 every 30 days.   Vitamin D deficiency    takes Vitamin D daily    Past Surgical History:  Procedure Laterality Date   CHOLECYSTECTOMY  2003   COLONOSCOPY  05/2007   ESOPHAGOGASTRODUODENOSCOPY     EUS N/A 07/23/2012   Procedure: FULL UPPER ENDOSCOPIC ULTRASOUND (EUS) RADIAL;  Surgeon: Arta Silence, MD;  Location: [facility];  Service: Endoscopy;  Laterality: N/A;   HERNIA REPAIR     INSERTION OF MESH N/A 09/03/2016   Procedure: INSERTION OF MESH;  Surgeon: Armandina Gemma, MD;  Location: [facility];  Service: General;  Laterality: N/A;   LAPAROSCOPIC INCISIONAL / UMBILICAL / [place]     w/mesh/notes 09/03/2016   percuteaneous puncture  X 3   to place stent in one of my ducts related to scar tissue from [place] N/A 09/03/2016   Procedure: [place];  Surgeon: Armandina Gemma, MD;  Location:  [facility];  Service: General;  Laterality: N/A;   WHIPPLE PROCEDURE  2003   pancreatic lymphoma   WISDOM TOOTH EXTRACTION      Social History:  reports that he has never smoked. He has never used smokeless tobacco. He reports current alcohol use of about 6.0 standard drinks per week. He reports that he does not use drugs.  Allergies:  Allergies  Allergen Reactions   Azilsartan Other (See Comments)    Possibly caused hives (as Edarbyclor) on 06/16/14   Chlorthalidone Other (See Comments)     Possibly caused hives (as Edarbyclor) on 06/16/14   Shellfish Allergy    Amlodipine Hives, Itching and Rash   Lisinopril Hives, Itching and Rash    Medications Prior to Admission  Medication Sig Dispense Refill   aspirin 81 MG tablet Take 81 mg by mouth daily.     BYSTOLIC 10 MG tablet Take 10 mg by mouth daily.  1   CREON 24000-76000 units CPEP Take 5-6 capsules by mouth 3 (three) times daily with meals. Take 3 capsules with snacks  0   cyanocobalamin (,VITAMIN B-12,) 1000 MCG/ML injection Inject 1,000 mcg into the muscle every 30 (thirty) days.      glucosamine-chondroitin 500-400 MG tablet Take 1 tablet by mouth daily.      NOVOLOG 100 UNIT/ML injection Mini med insulin pump     valsartan (DIOVAN) 320 MG tablet Take 320 mg by mouth daily.  1   Vitamin D, Ergocalciferol, (DRISDOL) 50000 UNITS CAPS Take 50,000 Units by mouth every Monday.      esomeprazole (NEXIUM) 20 MG capsule Take 20 mg by mouth daily at 12 noon.     HYDROcodone-acetaminophen (NORCO/VICODIN) 5-325 MG tablet Take 1-2 tablets by mouth every 4 (four) hours as needed for moderate pain. (Patient not taking: Reported on 03/21/2021) 20 tablet 0    Blood pressure (!) 145/58, pulse 87, temperature 99 F (37.2 C), temperature source Oral, resp. rate 18, height 5\' 9"  (1.753 m), weight 97.1 kg, SpO2 95 %. Physical Exam:  General: pleasant, WD, overweight male who is laying in bed in NAD HEENT: head is normocephalic, atraumatic.  Sclera are anicteric Ears and nose without any masses or lesions.  Mouth is pink and dry Heart: regular, rate, and rhythm.  Normal s1,s2. No obvious murmurs, gallops, or rubs noted.   Lungs: no audible wheezing. Respiratory effort nonlabored Abd: soft, NT, ND, +BS, midline surgical scar MS: all 4 extremities are symmetrical with no cyanosis, clubbing, or edema. Skin: warm and dry with no masses, lesions, or rashes Neuro: Cranial nerves 2-12 grossly intact, sensation is grossly normal throughout Psych:  A&Ox3 with an appropriate affect.   Results for orders placed or performed during the hospital encounter of 03/21/21 (from the past 48 hour(s))  Lipase, blood     Status: Abnormal   Collection Time: 03/21/21  2:25 PM  Result Value Ref Range   Lipase <10 (L) 11 - 51 U/L    Comment: Performed at [facility], [address], [place], [place] [address]  Comprehensive metabolic panel     Status: Abnormal   Collection Time: 03/21/21  2:25 PM  Result Value Ref Range   Sodium 128 (L) 135 - 145 mmol/L   Potassium 4.8 3.5 - 5.1 mmol/L   Chloride 94 (L) 98 - 111 mmol/L   CO2 21 (L) 22 - 32 mmol/L   Glucose, Bld 190 (H) 70 - 99 mg/dL    Comment: Glucose reference range applies only  to samples taken after fasting for at least 8 hours.   BUN 15 8 - 23 mg/dL   Creatinine, Ser 1.31 (H) 0.61 - 1.24 mg/dL   Calcium 9.3 8.9 - 10.3 mg/dL   Total Protein 6.7 6.5 - 8.1 g/dL   Albumin 3.9 3.5 - 5.0 g/dL   AST 623 (H) 15 - 41 U/L   ALT 546 (H) 0 - 44 U/L   Alkaline Phosphatase 365 (H) 38 - 126 U/L   Total Bilirubin 3.1 (H) 0.3 - 1.2 mg/dL   GFR, Estimated >60 >60 mL/min    Comment: (NOTE) Calculated using the CKD-EPI Creatinine Equation (2021)    Anion gap 13 5 - 15    Comment: Performed at [facility], [address] [place], [place] [address]  CBC     Status: Abnormal   Collection Time: 03/21/21  2:25 PM  Result Value Ref Range   WBC 18.1 (H) 4.0 - 10.5 K/uL   RBC 4.96 4.22 - 5.81 MIL/uL   Hemoglobin 14.1 13.0 - 17.0 g/dL   HCT 41.3 39.0 - 52.0 %   MCV 83.3 80.0 - 100.0 fL   MCH 28.4 26.0 - 34.0 pg   MCHC 34.1 30.0 - 36.0 g/dL   RDW 13.3 11.5 - 15.5 %   Platelets 228 150 - 400 K/uL   nRBC 0.0 0.0 - 0.2 %    Comment: Performed at [facility], [place], [place] [address]  Troponin I (High Sensitivity)     Status: None   Collection Time: 03/21/21  2:25 PM  Result Value Ref Range   Troponin I (High Sensitivity) 12  <18 ng/L    Comment: (NOTE) Elevated high sensitivity troponin I (hsTnI) values and significant  changes across serial measurements may suggest ACS but many other  chronic and acute conditions are known to elevate hsTnI results.  Refer to the "Links" section for chest pain algorithms and additional  guidance. Performed at [facility], [address], [place], [place] [address]   Lactic acid, plasma     Status: Abnormal   Collection Time: 03/21/21  2:44 PM  Result Value Ref Range   Lactic Acid, Venous 2.7 (HH) 0.5 - 1.9 mmol/L    Comment: CRITICAL RESULT CALLED TO, READ BACK BY AND VERIFIED WITH: FOUNTAIN,M,RN @ 7035 03/21/21 BY GWYN,P Performed at [facility], [address] [place], [place] [address]   Blood culture (routine x 2)     Status: None (Preliminary result)   Collection Time: 03/21/21  2:44 PM   Specimen: BLOOD  Result Value Ref Range   Specimen Description BLOOD LEFT ANTECUBITAL    Special Requests      BOTTLES DRAWN AEROBIC AND ANAEROBIC Blood Culture adequate volume   Culture  Setup Time      GRAM NEGATIVE RODS IN BOTH AEROBIC AND ANAEROBIC BOTTLES CRITICAL RESULT CALLED TO, READ BACK BY AND VERIFIED WITH: PHARMD JAMES LEDFORD 03/22/21@4 :51 BY TW Performed at [facility], [place] [address], [place], [place] [address]    Culture GRAM NEGATIVE RODS    Report Status PENDING   Blood Culture ID Panel (Reflexed)     Status: Abnormal   Collection Time: 03/21/21  2:44 PM  Result Value Ref Range   Enterococcus faecalis NOT DETECTED NOT DETECTED   Enterococcus Faecium NOT DETECTED NOT DETECTED   Listeria monocytogenes NOT DETECTED NOT DETECTED   Staphylococcus species NOT DETECTED NOT DETECTED   Staphylococcus aureus (BCID) NOT  DETECTED NOT DETECTED   Staphylococcus epidermidis NOT DETECTED NOT DETECTED   Staphylococcus lugdunensis NOT DETECTED NOT DETECTED   Streptococcus species NOT DETECTED NOT DETECTED   Streptococcus  agalactiae NOT DETECTED NOT DETECTED   Streptococcus pneumoniae NOT DETECTED NOT DETECTED   Streptococcus pyogenes NOT DETECTED NOT DETECTED   A.calcoaceticus-baumannii NOT DETECTED NOT DETECTED   Bacteroides fragilis NOT DETECTED NOT DETECTED   Enterobacterales DETECTED (A) NOT DETECTED    Comment: CRITICAL RESULT CALLED TO, READ BACK BY AND VERIFIED WITH: PHARMD JAMES LEDFORD 03/22/21@4 :51 BY TW    Enterobacter cloacae complex NOT DETECTED NOT DETECTED   Escherichia coli DETECTED (A) NOT DETECTED    Comment: CRITICAL RESULT CALLED TO, READ BACK BY AND VERIFIED WITH: PHARMD JAMES LEDFORD 03/22/21@4 :51 BY TW    Klebsiella aerogenes NOT DETECTED NOT DETECTED   Klebsiella oxytoca DETECTED (A) NOT DETECTED    Comment: CRITICAL RESULT CALLED TO, READ BACK BY AND VERIFIED WITH: PHARMD JAMES LEDFORD 03/22/21@4 :51 BY TW    Klebsiella pneumoniae NOT DETECTED NOT DETECTED   Proteus species NOT DETECTED NOT DETECTED   Salmonella species NOT DETECTED NOT DETECTED   Serratia marcescens NOT DETECTED NOT DETECTED   Haemophilus influenzae NOT DETECTED NOT DETECTED   Neisseria meningitidis NOT DETECTED NOT DETECTED   Pseudomonas aeruginosa NOT DETECTED NOT DETECTED   Stenotrophomonas maltophilia NOT DETECTED NOT DETECTED   Candida albicans NOT DETECTED NOT DETECTED   Candida auris NOT DETECTED NOT DETECTED   Candida glabrata NOT DETECTED NOT DETECTED   Candida krusei NOT DETECTED NOT DETECTED   Candida parapsilosis NOT DETECTED NOT DETECTED   Candida tropicalis NOT DETECTED NOT DETECTED   Cryptococcus neoformans/gattii NOT DETECTED NOT DETECTED   CTX-M ESBL NOT DETECTED NOT DETECTED   Carbapenem resistance IMP NOT DETECTED NOT DETECTED   Carbapenem resistance KPC NOT DETECTED NOT DETECTED   Carbapenem resistance NDM NOT DETECTED NOT DETECTED   Carbapenem resist OXA 48 LIKE NOT DETECTED NOT DETECTED   Carbapenem resistance VIM NOT DETECTED NOT DETECTED    Comment: Performed at [facility] [address] [place], [place] [address]  Resp Panel by RT-PCR (Flu A&B, Covid) Nasopharyngeal Swab     Status: None   Collection Time: 03/21/21  3:23 PM   Specimen: Nasopharyngeal Swab; Nasopharyngeal(NP) swabs in vial transport medium  Result Value Ref Range   SARS Coronavirus 2 by RT PCR NEGATIVE NEGATIVE    Comment: (NOTE) SARS-CoV-2 target nucleic acids are NOT DETECTED.  The SARS-CoV-2 RNA is generally detectable in upper respiratory specimens during the acute phase of infection. The lowest concentration of SARS-CoV-2 viral copies this assay can detect is 138 copies/mL. A negative result does not preclude SARS-Cov-2 infection and should not be used as the sole basis for treatment or other patient management decisions. A negative result may occur with  improper specimen collection/handling, submission of specimen other than nasopharyngeal swab, presence of viral mutation(s) within the areas targeted by this assay, and inadequate number of viral copies(<138 copies/mL). A negative result must be combined with clinical observations, patient history, and epidemiological information. The expected result is Negative.  Fact Sheet for Patients:  EntrepreneurPulse.com.au  Fact Sheet for Healthcare Providers:  IncredibleEmployment.be  This test is no t yet approved or cleared by the [place] FDA and  has been authorized for detection and/or diagnosis of SARS-CoV-2 by FDA under an Emergency Use Authorization (EUA). This EUA will remain  in effect (meaning this test can be used) for the duration of  the COVID-19 declaration under Section 564(b)(1) of the Act, 21 U.S.C.section 360bbb-3(b)(1), unless the authorization is terminated  or revoked sooner.       Influenza A by PCR NEGATIVE NEGATIVE   Influenza B by PCR NEGATIVE NEGATIVE    Comment: (NOTE) The Xpert Xpress SARS-CoV-2/FLU/RSV plus assay is intended as an aid in the diagnosis of  influenza from Nasopharyngeal swab specimens and should not be used as a sole basis for treatment. Nasal washings and aspirates are unacceptable for Xpert Xpress SARS-CoV-2/FLU/RSV testing.  Fact Sheet for Patients: EntrepreneurPulse.com.au  Fact Sheet for Healthcare Providers: IncredibleEmployment.be  This test is not yet approved or cleared by the [place] FDA and has been authorized for detection and/or diagnosis of SARS-CoV-2 by FDA under an Emergency Use Authorization (EUA). This EUA will remain in effect (meaning this test can be used) for the duration of the COVID-19 declaration under Section 564(b)(1) of the Act, 21 U.S.C. section 360bbb-3(b)(1), unless the authorization is terminated or revoked.  Performed at [facility], [address], [place], [place] [address]   Urinalysis, Routine w reflex microscopic Urine, Clean Catch     Status: Abnormal   Collection Time: 03/21/21  4:58 PM  Result Value Ref Range   Color, Urine YELLOW YELLOW   APPearance CLEAR CLEAR   Specific Gravity, Urine 1.037 (H) 1.005 - 1.030   pH 8.5 (H) 5.0 - 8.0   Glucose, UA 250 (A) NEGATIVE mg/dL   Hgb urine dipstick NEGATIVE NEGATIVE   Bilirubin Urine SMALL (A) NEGATIVE   Ketones, ur 15 (A) NEGATIVE mg/dL   Protein, ur 30 (A) NEGATIVE mg/dL   Nitrite NEGATIVE NEGATIVE   Leukocytes,Ua NEGATIVE NEGATIVE   RBC / HPF 0-5 0 - 5 RBC/hpf   WBC, UA 0-5 0 - 5 WBC/hpf   Squamous Epithelial / LPF 0-5 0 - 5    Comment: Performed at [facility], [address] [place], [place] [address]  Lactic acid, plasma     Status: Abnormal   Collection Time: 03/21/21  4:58 PM  Result Value Ref Range   Lactic Acid, Venous 3.2 (HH) 0.5 - 1.9 mmol/L    Comment: Performed at [facility], [address], [place], [place] [address] CORRECTED ON 11/08 AT 1754: PREVIOUSLY REPORTED AS 3.2 CRITICAL RESULT CALLED TO, READ  BACK BY AND VERIFIED WITH: Veneda Melter 2440 03/21/2021 DBRADLEY   Lactic acid, plasma     Status: Abnormal   Collection Time: 03/21/21  6:55 PM  Result Value Ref Range   Lactic Acid, Venous 2.7 (HH) 0.5 - 1.9 mmol/L    Comment: CRITICAL VALUE NOTED.  VALUE IS CONSISTENT WITH PREVIOUSLY REPORTED AND CALLED VALUE. HS Performed at [facility], [address], [place], [place] [address]   Procalcitonin     Status: None   Collection Time: 03/22/21  5:55 AM  Result Value Ref Range   Procalcitonin 25.41 ng/mL    Comment:        Interpretation: PCT >= 10 ng/mL: Important systemic inflammatory response, almost exclusively due to severe bacterial sepsis or septic shock. (NOTE)       Sepsis PCT Algorithm           Lower Respiratory Tract                                      Infection PCT Algorithm    ----------------------------     ----------------------------  PCT < 0.25 ng/mL                PCT < 0.10 ng/mL          Strongly encourage             Strongly discourage   discontinuation of antibiotics    initiation of antibiotics    ----------------------------     -----------------------------       PCT 0.25 - 0.50 ng/mL            PCT 0.10 - 0.25 ng/mL               OR       >80% decrease in PCT            Discourage initiation of                                            antibiotics      Encourage discontinuation           of antibiotics    ----------------------------     -----------------------------         PCT >= 0.50 ng/mL              PCT 0.26 - 0.50 ng/mL                AND       <80% decrease in PCT             Encourage initiation of                                             antibiotics       Encourage continuation           of antibiotics    ----------------------------     -----------------------------        PCT >= 0.50 ng/mL                  PCT > 0.50 ng/mL               AND         increase in PCT                  Strongly  encourage                                      initiation of antibiotics    Strongly encourage escalation           of antibiotics                                     -----------------------------                                           PCT <= 0.25 ng/mL  OR                                        > 80% decrease in PCT                                      Discontinue / Do not initiate                                             antibiotics  Performed at [facility], [place] [address] [place], [place] [address]   Comprehensive metabolic panel     Status: Abnormal   Collection Time: 03/22/21  5:55 AM  Result Value Ref Range   Sodium 129 (L) 135 - 145 mmol/L   Potassium 3.9 3.5 - 5.1 mmol/L   Chloride 99 98 - 111 mmol/L   CO2 23 22 - 32 mmol/L   Glucose, Bld 137 (H) 70 - 99 mg/dL    Comment: Glucose reference range applies only to samples taken after fasting for at least 8 hours.   BUN 14 8 - 23 mg/dL   Creatinine, Ser 1.42 (H) 0.61 - 1.24 mg/dL   Calcium 8.0 (L) 8.9 - 10.3 mg/dL   Total Protein 5.5 (L) 6.5 - 8.1 g/dL   Albumin 2.6 (L) 3.5 - 5.0 g/dL   AST 271 (H) 15 - 41 U/L   ALT 346 (H) 0 - 44 U/L   Alkaline Phosphatase 203 (H) 38 - 126 U/L   Total Bilirubin 4.0 (H) 0.3 - 1.2 mg/dL   GFR, Estimated 56 (L) >60 mL/min    Comment: (NOTE) Calculated using the CKD-EPI Creatinine Equation (2021)    Anion gap 7 5 - 15    Comment: Performed at [facility], [place] [address], [place], [place] [address]  CBC     Status: Abnormal   Collection Time: 03/22/21  5:55 AM  Result Value Ref Range   WBC 15.5 (H) 4.0 - 10.5 K/uL   RBC 4.33 4.22 - 5.81 MIL/uL   Hemoglobin 12.7 (L) 13.0 - 17.0 g/dL   HCT 36.0 (L) 39.0 - 52.0 %   MCV 83.1 80.0 - 100.0 fL   MCH 29.3 26.0 - 34.0 pg   MCHC 35.3 30.0 - 36.0 g/dL   RDW 13.8 11.5 - 15.5 %   Platelets 165 150 - 400 K/uL   nRBC 0.0 0.0 - 0.2 %    Comment: Performed at [facility], [place] [address] [place], [place] [address]   CT ABDOMEN PELVIS W CONTRAST  Result Date: 03/21/2021 CLINICAL DATA:  LUQ abdominal pain History of previous Whipple surgery, left upper quadrant abdominal pain and tenderness with swelling and some altered mental status. Concern for internal herniation versus obstruction versus other. EXAM: CT ABDOMEN AND PELVIS WITH CONTRAST TECHNIQUE: Multidetector CT imaging of the abdomen and pelvis was performed using the standard protocol following bolus administration of intravenous contrast. CONTRAST:  133mL OMNIPAQUE IOHEXOL 300 MG/ML  SOLN COMPARISON:  PET CT 07/21/2012 FINDINGS: Lower chest: Bibasilar atelectasis. Hepatobiliary: No focal liver abnormality. Status post cholecystectomy. No biliary dilatation. Pneumobilia. Pancreas: Status post Whipple procedure. Remaining pancreatic parenchyma is markedly diffusely atrophic. No  focal lesion. Otherwise normal pancreatic contour. No surrounding inflammatory changes. No main pancreatic ductal dilatation. Spleen: Normal in size without focal abnormality. Adrenals/Urinary Tract: No adrenal nodule bilaterally. Bilateral kidneys enhance symmetrically. No hydronephrosis. No hydroureter. The urinary bladder is unremarkable. On delayed imaging, there is no urothelial wall thickening and there are no filling defects in the opacified portions of the bilateral collecting systems or ureters. Stomach/Bowel: Status post Whipple procedure. Stomach is within normal limits. Focal short loop of small bowel within the right upper quadrant dilated with fecalized material measuring up to 5.5 cm adjacent to anastomosis. No evidence of large bowel wall thickening or dilatation. Appendix appears normal. Vascular/Lymphatic: No abdominal aorta or iliac aneurysm. Moderate to severe atherosclerotic plaque of the aorta and its branches. No abdominal, pelvic, or inguinal lymphadenopathy. Reproductive: Prostate is unremarkable. Other: No intraperitoneal  free fluid. No intraperitoneal free gas. No organized fluid collection. Musculoskeletal: Small to moderate volume left inguinal hernia containing a portion of the urinary bladder. IMPRESSION: 1. Small to moderate volume left inguinal hernia containing a portion of the urinary bladder. Correlate with physical exam for incarceration. 2. Focal short loop of small bowel within the right upper quadrant dilated with fecalized material. Finding may represent a developing obstruction versus may be normal variant in the setting of postsurgical changes. 3.  Aortic Atherosclerosis (ICD10-I70.0). Electronically Signed   By: Iven Finn M.D.   On: 03/21/2021 16:34   DG Chest Portable 1 View  Result Date: 03/21/2021 CLINICAL DATA:  Infection evaluation. EXAM: PORTABLE CHEST 1 VIEW COMPARISON:  Chest CT 07/21/2012 FINDINGS: The cardiac silhouette is normal in size. There are mild interstitial densities bilaterally most notable in the peripheral left mid and lower lung. No consolidative airspace opacity, pleural effusion, or pneumothorax is identified. No acute osseous abnormality is seen. IMPRESSION: Mild interstitial densities in the left greater than right lungs which could reflect atypical/viral infection or early edema. Electronically Signed   By: Logan Bores M.D.   On: 03/21/2021 19:15   US Abdomen Limited RUQ (LIVER/GB)  Result Date: 03/22/2021 CLINICAL DATA:  Elevated liver enzymes. Status post Whipple procedure. EXAM: ULTRASOUND ABDOMEN LIMITED RIGHT UPPER QUADRANT COMPARISON:  03/21/2021. FINDINGS: Gallbladder: Surgically absent. Common bile duct: Diameter: 2.7 mm Liver: No focal lesion identified. Within normal limits in parenchymal echogenicity. Portal vein is patent on color Doppler imaging with normal direction of blood flow towards the liver. Other: No free fluid. IMPRESSION: 1. No acute abnormality. 2. Status post cholecystectomy. Electronically Signed   By: Brett Fairy M.D.   On: 03/22/2021 04:17       Assessment/Plan Bacteremia - blood cxs with klebsiella, E.coli and enterobacterales; urine cx pending but unclear source at this time - WBC 15 from 18, VSS PSBO vs possible enteritis  - focal short loop of small bowel in RUQ - patient no longer having abdominal pain or nausea, having diarrhea - ok to have CLD  - check GI panel  - no indication for emergency general surgery intervention at this time [facility] containing urinary bladder - having some incomplete emptying at times, urine cx pending but UA not suggestive of UTI - consider outpatient urology follow up   FEN: CLD, IVF @100cc /h per primary  VTE: LMWH ID: [id number]  T1DM  HTN Hx of Whipple for B cell lymphoma in the head of the pancreas OSA GERD  Norm Parcel, [facility] Surgery 03/22/2021, 8:23 AM Please see Amion for pager number during day hours 7:00am-4:30pm

## 2021-03-23 DIAGNOSIS — Z9049 Acquired absence of other specified parts of digestive tract: Secondary | ICD-10-CM

## 2021-03-23 DIAGNOSIS — A4151 Sepsis due to Escherichia coli [E. coli]: Principal | ICD-10-CM

## 2021-03-23 DIAGNOSIS — R748 Abnormal levels of other serum enzymes: Secondary | ICD-10-CM

## 2021-03-23 DIAGNOSIS — K56609 Unspecified intestinal obstruction, unspecified as to partial versus complete obstruction: Secondary | ICD-10-CM | POA: Diagnosis not present

## 2021-03-23 DIAGNOSIS — N179 Acute kidney failure, unspecified: Secondary | ICD-10-CM | POA: Diagnosis not present

## 2021-03-23 DIAGNOSIS — Z90410 Acquired total absence of pancreas: Secondary | ICD-10-CM

## 2021-03-23 LAB — GASTROINTESTINAL PANEL BY PCR, STOOL (REPLACES STOOL CULTURE)

## 2021-03-23 LAB — COMPREHENSIVE METABOLIC PANEL
ALT: 225 U/L — ABNORMAL HIGH (ref 0–44)
AST: 131 U/L — ABNORMAL HIGH (ref 15–41)
Albumin: 2.5 g/dL — ABNORMAL LOW (ref 3.5–5.0)
Alkaline Phosphatase: 184 U/L — ABNORMAL HIGH (ref 38–126)
Anion gap: 9 (ref 5–15)
BUN: 16 mg/dL (ref 8–23)
CO2: 20 mmol/L — ABNORMAL LOW (ref 22–32)
Calcium: 7.7 mg/dL — ABNORMAL LOW (ref 8.9–10.3)
Chloride: 99 mmol/L (ref 98–111)
Creatinine, Ser: 1.25 mg/dL — ABNORMAL HIGH (ref 0.61–1.24)
GFR, Estimated: >60 mL/min (ref >60)
Glucose, Bld: 133 mg/dL — ABNORMAL HIGH (ref 70–99)
Potassium: 3.7 mmol/L (ref 3.5–5.1)
Sodium: 128 mmol/L — ABNORMAL LOW (ref 135–145)
Total Bilirubin: 2.8 mg/dL — ABNORMAL HIGH (ref 0.3–1.2)
Total Protein: 5.4 g/dL — ABNORMAL LOW (ref 6.5–8.1)

## 2021-03-23 LAB — CBC
HCT: 35.3 % — ABNORMAL LOW (ref 39.0–52.0)
Hemoglobin: 12.1 g/dL — ABNORMAL LOW (ref 13.0–17.0)
MCH: 28.8 pg (ref 26.0–34.0)
MCHC: 34.3 g/dL (ref 30.0–36.0)
MCV: 84 fL (ref 80.0–100.0)
Platelets: 151 10*3/uL (ref 150–400)
RBC: 4.2 MIL/uL — ABNORMAL LOW (ref 4.22–5.81)
RDW: 13.9 % (ref 11.5–15.5)
WBC: 13.3 10*3/uL — ABNORMAL HIGH (ref 4.0–10.5)
nRBC: 0 % (ref 0.0–0.2)

## 2021-03-23 LAB — URINE CULTURE: Culture: NO GROWTH

## 2021-03-23 LAB — GLUCOSE, CAPILLARY
Glucose-Capillary: 140 mg/dL — ABNORMAL HIGH (ref 70–99)
Glucose-Capillary: 131 mg/dL — ABNORMAL HIGH (ref 70–99)
Glucose-Capillary: 146 mg/dL — ABNORMAL HIGH (ref 70–99)
Glucose-Capillary: 141 mg/dL — ABNORMAL HIGH (ref 70–99)

## 2021-03-23 MED ORDER — SODIUM CHLORIDE 0.9 % IV SOLN: INTRAVENOUS | Status: AC

## 2021-03-23 NOTE — Progress Notes (Signed)
Mobility Specialist Progress Note    03/23/21 1559  Mobility  Activity Ambulated in hall  Level of Assistance Modified independent, requires aide device or extra time  Assistive Device  (IV pole)  Distance Ambulated (ft) 285 ft  Mobility Ambulated with assistance in hallway  Mobility Response Tolerated well  Mobility performed by Mobility specialist  Bed Position Chair  $Mobility charge 1 Mobility   Pt received in bed and agreeable. No complaints on walk. Did have a coughing spell because he 'had a frog in his throat'. Returned to chair with family present in room.   Hildred Alamin Mobility Specialist  Mobility Specialist Phone: [phone]

## 2021-03-23 NOTE — Consult Note (Signed)
Consultation  Referring Provider:     Domenic Polite, MD Primary Care Physician:  Haywood Pao, MD Primary Gastroenterologist:        Dr. 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 coli and Klebsiella).  Additionally, admission imaging n/f SBO and left inguinal hernia which contains portions of the bladder.  GI service consulted for elevated liver enzymes at the request of the patient.  Admission evaluation notable for the following: - CT (11/8): e/o prior ccy, Whipple.  Pneumobilia but otherwise normal-appearing liver.  Atrophic remaining pancreas without PD dilation.  Focal dilation of the small bowel in the RUQ adjacent to the anastomosis.  Normal-appearing colon.  Small to moderate size left inguinal hernia containing portions of the bladder - RUQ [place] (11/9): ccy, normal-appearing liver with 2.7 mm CBD.  Normal Dopplers - Blood cultures: Klebsiella, E. Coli  - 03/21/2021: AST/ALT/ALP 623/546/365, T bili 3.1 - 03/22/2021: AST/ALT/ALP 271/346/203, T bili 4.0 - 03/23/2021: AST/ALT/ALP 131/235/184, T bili 2.8  - WBC downtrending at 13.3 today (peak 18).  Lactate downtrending at 2.7. - GI PCR panel negative  Patient reports a history of undulating liver enzymes as an outpatient.  This has been followed by Dr. Arville Go off with no clear etiology and normalization without intervention.  He is otherwise without known liver disease.  In 2014 had transhepatic biliary stents placed by IR at [facility] for obstructing benign CBD stricture (biliary enteric anastomosis not accessible endoscopically)  Endoscopic History: - EUS (07/2012, Dr. Paulita Fujita): Small HH, patent Schatzki's  ring, moderate LA grade B/C esophagitis.  Prior pancreaticoduodenectomy with healthy anastomosis.  EUS with grossly normal pancreatic remnant.  Intrahepatic biliary dilation.   Past Medical History:  Diagnosis Date   Arthritis    "left ankle; I broke it in high school" (09/03/2016)   Childhood asthma    Diabetes mellitus without complication (HCC)    Novolog insulin pump.Average fasting blood sugar runs 145   Diabetic retinopathy associated with type 1 diabetes mellitus ([place])    Diffuse large B cell lymphoma (HCC)    Dyslipidemia    GERD (gastroesophageal reflux disease)    takes Omeprazole daily   History of blood transfusion 2003   related to Whipple; developed itching/blotching X 1; transfused later also related to the Whipple; no problems" (09/03/2016)   Hypertension    takes Bystolic and Diovan daily   Joint pain    Nocturia    Pancreatic lymphoma ([place]) 02/14/2002   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 Valere Dross   Sleep apnea    mild to moderate; "couldn't tolerate CPAP; I wear an appliance" (09/03/2016)   Vitamin B12 deficiency    takes B12 every 30 days.   Vitamin D deficiency    takes Vitamin D daily    Past Surgical History:  Procedure Laterality Date   CHOLECYSTECTOMY  2003   COLONOSCOPY  05/2007   ESOPHAGOGASTRODUODENOSCOPY     EUS N/A 07/23/2012   Procedure: FULL UPPER ENDOSCOPIC ULTRASOUND (EUS) RADIAL;  Surgeon: Arta Silence, MD;  Location: [facility];  Service: Endoscopy;  Laterality: N/A;   HERNIA REPAIR     INSERTION OF MESH N/A 09/03/2016   Procedure: INSERTION OF MESH;  Surgeon: Armandina Gemma, MD;  Location: [place];  Service: General;  Laterality: N/A;   LAPAROSCOPIC INCISIONAL / UMBILICAL / [place]     w/mesh/notes 09/03/2016   percuteaneous puncture  X 3   to place stent in one of my ducts related to scar tissue from [place] N/A 09/03/2016   Procedure: [place];  Surgeon: Armandina Gemma, MD;  Location: [place];  Service: General;  Laterality: N/A;   WHIPPLE PROCEDURE  2003   pancreatic lymphoma   WISDOM TOOTH EXTRACTION      History reviewed. No pertinent family history.   Social History   Tobacco Use   Smoking status: Never   Smokeless tobacco: Never  Substance Use Topics   Alcohol use: Yes    Alcohol/week: 6.0 standard drinks    Types: 6 Cans of beer per week   Drug use: No    Prior to Admission medications   Medication Sig Start Date End Date Taking? Authorizing Provider  aspirin 81 MG tablet Take 81 mg by mouth daily.   Yes [provider]  BYSTOLIC 10 MG tablet Take 10 mg by mouth daily. 08/08/16  Yes [provider]  CREON 24000-76000 units CPEP Take 5-6 capsules by mouth 3 (three) times daily with meals. Take 3 capsules with snacks 08/18/16  Yes [provider]  cyanocobalamin (,VITAMIN B-12,) 1000 MCG/ML injection Inject 1,000 mcg into the muscle every 30 (thirty) days.    Yes [provider]  glucosamine-chondroitin 500-400 MG tablet Take 1 tablet by mouth daily.    Yes [provider]  NOVOLOG 100 UNIT/ML injection Mini med insulin pump 07/06/12  Yes [provider]  valsartan (DIOVAN) 320 MG tablet Take 320 mg by mouth daily. 08/08/16  Yes [provider]  Vitamin D, Ergocalciferol, (DRISDOL) 50000 UNITS CAPS Take 50,000 Units by mouth every Monday.    Yes [provider]  esomeprazole (NEXIUM) 20 MG capsule Take 20 mg by mouth daily at 12 noon.    [provider]  HYDROcodone-acetaminophen (NORCO/VICODIN) 5-325 MG tablet Take 1-2 tablets by mouth every 4 (four) hours as needed for moderate pain. Patient not taking: Reported on 03/21/2021 09/05/16   Armandina Gemma, MD    Current Facility-Administered Medications  Medication Dose Route Frequency Provider Last Rate Last Admin    0.9 %  sodium chloride infusion   Intravenous Continuous Domenic Polite, MD 50 mL/hr at 03/23/21 1200 Rate Change at 03/23/21 1200   aspirin EC tablet 81 mg  81 mg Oral Daily Chotiner, Yevonne Aline, MD   81 mg at 03/23/21 1012   enoxaparin (LOVENOX) injection 40 mg  40 mg Subcutaneous Q24H Chotiner, Yevonne Aline, MD   40 mg at 03/23/21 1013   HYDROmorphone (DILAUDID) injection 1 mg  1 mg Intravenous Q3H PRN Chotiner, Yevonne Aline, MD       insulin pump   Subcutaneous TID WC, HS, 0200 Domenic Polite, MD   Given at 03/23/21 1200   lipase/protease/amylase (CREON) capsule 120,000 Units  120,000 Units Oral TID WC Chotiner, Yevonne Aline, MD   120,000 Units at 03/23/21 1012   lipase/protease/amylase (CREON) capsule 72,000 Units  72,000 Units Oral TID PRN Tu, Ching T, DO       nebivolol (BYSTOLIC) tablet 10 mg  10 mg Oral Daily Wyvonnia Dusky, MD   10 mg at 03/23/21 1013   pantoprazole (PROTONIX) EC tablet 40 mg  40 mg Oral Daily Wyvonnia Dusky, MD   40 mg at 03/23/21 1012   piperacillin-tazobactam (ZOSYN) IVPB 3.375 g  3.375 g Intravenous Q8H Wyvonnia Dusky, MD 12.5 mL/hr at 03/23/21 1019 3.375 g at 03/23/21 1019   traMADol (ULTRAM) tablet 50 mg  50 mg Oral Q6H PRN Chotiner, Yevonne Aline, MD        Allergies as of 03/21/2021 - Review Complete 03/21/2021  Allergen Reaction Noted   Azilsartan Other (See Comments) 05/27/2016   Chlorthalidone Other (See Comments) 05/10/2016   Shellfish allergy  07/23/2012   Amlodipine Hives, Itching, and Rash 11/19/2014   Lisinopril Hives, Itching, and Rash 11/19/2014     Review of Systems:    As per HPI, otherwise negative    Physical Exam:  Vital signs in last 24 hours: Temp:  [98.1 F (36.7 C)-99.2 F (37.3 C)] 98.1 F (36.7 C) (11/10 1146) Pulse Rate:  [74-81] 77 (11/10 1146) Resp:  [16-19] 16 (11/10 0822) BP: (147-168)/(56-79) 168/79 (11/10 1146) SpO2:  [91 %-95 %] 91 % (11/10 1146) Last BM Date: 03/22/21 General:   Pleasant male in NAD Head:   Normocephalic and atraumatic. Abdomen:  Soft, nondistended Neurologic:  Alert and  oriented x4;  grossly normal neurologically. Psych:  Alert and cooperative. Normal affect.  LAB RESULTS: Recent Labs    03/21/21 1425 03/22/21 0555 03/23/21 0249  WBC 18.1* 15.5* 13.3*  HGB 14.1 12.7* 12.1*  HCT 41.3 36.0* 35.3*  PLT 228 165 151   BMET Recent Labs    03/21/21 1425 03/22/21 0555 03/23/21 0249  NA 128* 129* 128*  K 4.8 3.9 3.7  CL 94* 99 99  CO2 21* 23 20*  GLUCOSE 190* 137* 133*  BUN 15 14 16   CREATININE 1.31* 1.42* 1.25*  CALCIUM 9.3 8.0* 7.7*   LFT Recent Labs    03/23/21 0249  PROT 5.4*  ALBUMIN 2.5*  AST 131*  ALT 225*  ALKPHOS 184*  BILITOT 2.8*   PT/INR No results for input(s): LABPROT, INR in the last 72 hours.  STUDIES: CT ABDOMEN PELVIS W CONTRAST  Result Date: 03/21/2021 CLINICAL DATA:  LUQ abdominal pain History of previous Whipple surgery, left upper quadrant abdominal pain and tenderness with swelling and some altered mental status. Concern for internal herniation versus obstruction versus other. EXAM: CT ABDOMEN AND PELVIS WITH CONTRAST TECHNIQUE: Multidetector CT imaging of the abdomen and pelvis was performed using the standard protocol following bolus administration of intravenous contrast. CONTRAST:  122mL OMNIPAQUE IOHEXOL 300 MG/ML  SOLN COMPARISON:  PET CT 07/21/2012 FINDINGS: Lower chest: Bibasilar atelectasis. Hepatobiliary: No focal liver abnormality. Status post cholecystectomy. No biliary dilatation. Pneumobilia. Pancreas: Status post Whipple procedure. Remaining pancreatic parenchyma is markedly diffusely atrophic. No focal lesion. Otherwise normal pancreatic contour. No surrounding inflammatory changes. No main pancreatic ductal dilatation. Spleen: Normal in size without focal abnormality. Adrenals/Urinary Tract: No adrenal nodule bilaterally. Bilateral kidneys enhance symmetrically. No hydronephrosis. No hydroureter. The urinary bladder is  unremarkable. On delayed imaging, there is no urothelial wall thickening and there are no filling defects in the opacified portions of the bilateral collecting systems or ureters. Stomach/Bowel: Status post Whipple procedure. Stomach is within normal limits. Focal short loop of small bowel within the right upper quadrant dilated with fecalized material measuring up to 5.5 cm adjacent to anastomosis. No evidence of large bowel wall thickening or  dilatation. Appendix appears normal. Vascular/Lymphatic: No abdominal aorta or iliac aneurysm. Moderate to severe atherosclerotic plaque of the aorta and its branches. No abdominal, pelvic, or inguinal lymphadenopathy. Reproductive: Prostate is unremarkable. Other: No intraperitoneal free fluid. No intraperitoneal free gas. No organized fluid collection. Musculoskeletal: Small to moderate volume left inguinal hernia containing a portion of the urinary bladder. IMPRESSION: 1. Small to moderate volume left inguinal hernia containing a portion of the urinary bladder. Correlate with physical exam for incarceration. 2. Focal short loop of small bowel within the right upper quadrant dilated with fecalized material. Finding may represent a developing obstruction versus may be normal variant in the setting of postsurgical changes. 3.  Aortic Atherosclerosis (ICD10-I70.0). Electronically Signed   By: Iven Finn M.D.   On: 03/21/2021 16:34   DG Chest Portable 1 View  Result Date: 03/21/2021 CLINICAL DATA:  Infection evaluation. EXAM: PORTABLE CHEST 1 VIEW COMPARISON:  Chest CT 07/21/2012 FINDINGS: The cardiac silhouette is normal in size. There are mild interstitial densities bilaterally most notable in the peripheral left mid and lower lung. No consolidative airspace opacity, pleural effusion, or pneumothorax is identified. No acute osseous abnormality is seen. IMPRESSION: Mild interstitial densities in the left greater than right lungs which could reflect atypical/viral  infection or early edema. Electronically Signed   By: Logan Bores M.D.   On: 03/21/2021 19:15   US Abdomen Limited RUQ (LIVER/GB)  Result Date: 03/22/2021 CLINICAL DATA:  Elevated liver enzymes. Status post Whipple procedure. EXAM: ULTRASOUND ABDOMEN LIMITED RIGHT UPPER QUADRANT COMPARISON:  03/21/2021. FINDINGS: Gallbladder: Surgically absent. Common bile duct: Diameter: 2.7 mm Liver: No focal lesion identified. Within normal limits in parenchymal echogenicity. Portal vein is patent on color Doppler imaging with normal direction of blood flow towards the liver. Other: No free fluid. IMPRESSION: 1. No acute abnormality. 2. Status post cholecystectomy. Electronically Signed   By: Brett Fairy M.D.   On: 03/22/2021 04:17      Impression / Plan:   1) Elevated liver enzymes 2) History of Pancreatic B-cell lymphoma s/p Whipple 2003 3) E. coli, Klebsiella bacteremia/Sepsis - Elevated liver enzymes almost certainly from sepsis/systemic illness.  Liver enzymes downtrending.  T bili peaked at 4.0, and downtrending today. - Aside from pneumobilia (expected after Whipple), no bile duct or hepatic pathology noted on CT or RUQ [place] - WBC downtrending - No further hepatic work-up needed at this time - No plan for ERCP or other endoscopic intervention - Continue antibiotics per primary service  4) SBO - Resolved.  Tolerating diet - General Surgery service following.  Advancing diet today and no plan for emergent surgery  5) Inguinal hernia - Per General Surgery notes, consideration of outpatient elective inguinal hernia repair  GI service will sign off at this time.  Please do not hesitate to contact us with additional questions or concerns     LOS: 1 day   Lavena Bullion  03/23/2021, 2:41 PM

## 2021-03-23 NOTE — Progress Notes (Addendum)
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 -In the ED he was found to be febrile, tachycardic with leukocytosis, lactic acidosis -CT abdomen pelvis noted focal dilated loop of small bowel and small to moderate volume left inguinal hernia containing a portion of the urinary bladder   Assessment & Plan:   Transient PSBO Sepsis E. coli and Klebsiella bacteremia -Clinically suspect transient PSBO on 11/8 with resultant translocation of bacteria, sepsis and bacteremia -Clinically improving, symptoms have resolved, afebrile, leukocytosis improving -General surgery following, advance diet -Continue IV Zosyn, follow-up sensitivities of bacteria, -Transition to oral antibiotics in 1 to 2 days   Left inguinal hernia containing urinary bladder -History of incomplete bladder emptying and urinary frequency for several months -urine Cx negative -Will need CCS Fu for hernia repair    Type 1 diabetes mellitus -On insulin pump, this was continued, protocol ordered   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 Roderic Palau at bedside Disposition  Plan:  Status is: Inpatient  Remains inpatient appropriate because: Severity of illness   Consultants:  General surgery  Procedures:   Antimicrobials:    Subjective: -Feels better, having bowel movements, denies any abdominal symptoms anymore  Objective: Vitals:   03/22/21 1500 03/22/21 2013 03/23/21 0822 03/23/21 1146  BP: (!) 147/56 (!) 157/58 (!) 164/63 (!) 168/79  Pulse: 77 81 74 77  Resp: 19 17 16    Temp: 98.8 F (37.1 C) 99.2 F (37.3 C) 98.8 F (37.1 C) 98.1 F (36.7 C)  TempSrc: Oral Oral Oral   SpO2: 95% 92% 93% 91%  Weight:      Height:        Intake/Output Summary (Last 24 hours) at 03/23/2021 1152 Last data filed at 03/22/2021 1500 Gross per 24 hour  Intake 250.48 ml  Output --  Net 250.48 ml   Filed Weights   03/21/21 1356  Weight: 97.1 kg    Examination:  General exam: Pleasant male sitting up in bed, AAOx3, no distress HEENT: No JVD CVS: S1-S2, regular rate rhythm Lungs: Clear bilaterallyLungs: clear Abdomen: Soft, large surgical scar with lateral abdominal wall defect, nontender, bowel sounds present Extremities: No edema Skin: No rash on exposed skin  Data Reviewed:   CBC: Recent Labs  Lab 03/21/21 1425 03/22/21 0555 03/23/21 0249  WBC 18.1* 15.5* 13.3*  HGB 14.1 12.7* 12.1*  HCT 41.3 36.0* 35.3*  MCV 83.3 83.1 84.0  PLT 228 165 488   Basic Metabolic Panel: Recent Labs  Lab 03/21/21 1425 03/22/21 0555 03/23/21 0249  NA 128* 129* 128*  K 4.8 3.9 3.7  CL 94* 99 99  CO2 21* 23 20*  GLUCOSE 190* 137* 133*  BUN 15 14 16   CREATININE 1.31* 1.42* 1.25*  CALCIUM 9.3  8.0* 7.7*   GFR: Estimated Creatinine Clearance: 71.4 mL/min (A) (by C-G formula based on SCr of 1.25 mg/dL (H)). Liver Function Tests: Recent Labs  Lab 03/21/21 1425 03/22/21 0555 03/23/21 0249  AST 623* 271* 131*  ALT 546* 346* 225*  ALKPHOS 365* 203* 184*  BILITOT 3.1* 4.0* 2.8*  PROT 6.7 5.5* 5.4*  ALBUMIN 3.9 2.6* 2.5*   Recent Labs  Lab  03/21/21 1425  LIPASE <10*   No results for input(s): AMMONIA in the last 168 hours. Coagulation Profile: No results for input(s): INR, PROTIME in the last 168 hours. Cardiac Enzymes: No results for input(s): CKTOTAL, CKMB, CKMBINDEX, TROPONINI in the last 168 hours. BNP (last 3 results) No results for input(s): PROBNP in the last 8760 hours. HbA1C: No results for input(s): HGBA1C in the last 72 hours. CBG: Recent Labs  Lab 03/23/21 0730 03/23/21 1144  GLUCAP 140* 131*   Lipid Profile: No results for input(s): CHOL, HDL, LDLCALC, TRIG, CHOLHDL, LDLDIRECT in the last 72 hours. Thyroid Function Tests: No results for input(s): TSH, T4TOTAL, FREET4, T3FREE, THYROIDAB in the last 72 hours. Anemia Panel: No results for input(s): VITAMINB12, FOLATE, FERRITIN, TIBC, IRON, RETICCTPCT in the last 72 hours. Urine analysis:    Component Value Date/Time   COLORURINE YELLOW 03/21/2021 1658   APPEARANCEUR CLEAR 03/21/2021 1658   LABSPEC 1.037 (H) 03/21/2021 1658   PHURINE 8.5 (H) 03/21/2021 1658   GLUCOSEU 250 (A) 03/21/2021 1658   HGBUR NEGATIVE 03/21/2021 1658   BILIRUBINUR SMALL (A) 03/21/2021 1658   KETONESUR 15 (A) 03/21/2021 1658   PROTEINUR 30 (A) 03/21/2021 1658   NITRITE NEGATIVE 03/21/2021 1658   LEUKOCYTESUR NEGATIVE 03/21/2021 1658   Sepsis Labs: @LABRCNTIP (procalcitonin:4,lacticidven:4)  ) Recent Results (from the past 240 hour(s))  Blood culture (routine x 2)     Status: None (Preliminary result)   Collection Time: 03/21/21  2:44 PM   Specimen: BLOOD  Result Value Ref Range Status   Specimen Description BLOOD LEFT ANTECUBITAL  Final   Special Requests   Final    BOTTLES DRAWN AEROBIC AND ANAEROBIC Blood Culture adequate volume   Culture  Setup Time   Final    GRAM NEGATIVE RODS IN BOTH AEROBIC AND ANAEROBIC BOTTLES CRITICAL RESULT CALLED TO, READ BACK BY AND VERIFIED WITH: PHARMD JAMES LEDFORD 03/22/21@4 :51 BY TW Performed at [facility], [place]  [address], [place], [place] [address]    Culture GRAM NEGATIVE RODS  Final   Report Status PENDING  Incomplete  Blood Culture ID Panel (Reflexed)     Status: Abnormal   Collection Time: 03/21/21  2:44 PM  Result Value Ref Range Status   Enterococcus faecalis NOT DETECTED NOT DETECTED Final   Enterococcus Faecium NOT DETECTED NOT DETECTED Final   Listeria monocytogenes NOT DETECTED NOT DETECTED Final   Staphylococcus species NOT DETECTED NOT DETECTED Final   Staphylococcus aureus (BCID) NOT DETECTED NOT DETECTED Final   Staphylococcus epidermidis NOT DETECTED NOT DETECTED Final   Staphylococcus lugdunensis NOT DETECTED NOT DETECTED Final   Streptococcus species NOT DETECTED NOT DETECTED Final   Streptococcus agalactiae NOT DETECTED NOT DETECTED Final   Streptococcus pneumoniae NOT DETECTED NOT DETECTED Final   Streptococcus pyogenes NOT DETECTED NOT DETECTED Final   A.calcoaceticus-baumannii NOT DETECTED NOT DETECTED Final   Bacteroides fragilis NOT DETECTED NOT DETECTED Final   Enterobacterales DETECTED (A) NOT DETECTED Final    Comment: CRITICAL RESULT CALLED TO, READ BACK BY AND VERIFIED WITH: PHARMD JAMES LEDFORD 03/22/21@4 :51 BY TW  Enterobacter cloacae complex NOT DETECTED NOT DETECTED Final   Escherichia coli DETECTED (A) NOT DETECTED Final    Comment: CRITICAL RESULT CALLED TO, READ BACK BY AND VERIFIED WITH: PHARMD JAMES LEDFORD 03/22/21@4 :51 BY TW    Klebsiella aerogenes NOT DETECTED NOT DETECTED Final   Klebsiella oxytoca DETECTED (A) NOT DETECTED Final    Comment: CRITICAL RESULT CALLED TO, READ BACK BY AND VERIFIED WITH: PHARMD JAMES LEDFORD 03/22/21@4 :51 BY TW    Klebsiella pneumoniae NOT DETECTED NOT DETECTED Final   Proteus species NOT DETECTED NOT DETECTED Final   Salmonella species NOT DETECTED NOT DETECTED Final   Serratia marcescens NOT DETECTED NOT DETECTED Final   Haemophilus influenzae NOT DETECTED NOT DETECTED Final   Neisseria meningitidis NOT DETECTED NOT  DETECTED Final   Pseudomonas aeruginosa NOT DETECTED NOT DETECTED Final   Stenotrophomonas maltophilia NOT DETECTED NOT DETECTED Final   Candida albicans NOT DETECTED NOT DETECTED Final   Candida auris NOT DETECTED NOT DETECTED Final   Candida glabrata NOT DETECTED NOT DETECTED Final   Candida krusei NOT DETECTED NOT DETECTED Final   Candida parapsilosis NOT DETECTED NOT DETECTED Final   Candida tropicalis NOT DETECTED NOT DETECTED Final   Cryptococcus neoformans/gattii NOT DETECTED NOT DETECTED Final   CTX-M ESBL NOT DETECTED NOT DETECTED Final   Carbapenem resistance IMP NOT DETECTED NOT DETECTED Final   Carbapenem resistance KPC NOT DETECTED NOT DETECTED Final   Carbapenem resistance NDM NOT DETECTED NOT DETECTED Final   Carbapenem resist OXA 48 LIKE NOT DETECTED NOT DETECTED Final   Carbapenem resistance VIM NOT DETECTED NOT DETECTED Final    Comment: Performed at [facility], [address], [place], [place] [address]  Resp Panel by RT-PCR (Flu A&B, Covid) Nasopharyngeal Swab     Status: None   Collection Time: 03/21/21  3:23 PM   Specimen: Nasopharyngeal Swab; Nasopharyngeal(NP) swabs in vial transport medium  Result Value Ref Range Status   SARS Coronavirus 2 by RT PCR NEGATIVE NEGATIVE Final    Comment: (NOTE) SARS-CoV-2 target nucleic acids are NOT DETECTED.  The SARS-CoV-2 RNA is generally detectable in upper respiratory specimens during the acute phase of infection. The lowest concentration of SARS-CoV-2 viral copies this assay can detect is 138 copies/mL. A negative result does not preclude SARS-Cov-2 infection and should not be used as the sole basis for treatment or other patient management decisions. A negative result may occur with  improper specimen collection/handling, submission of specimen other than nasopharyngeal swab, presence of viral mutation(s) within the areas targeted by this assay, and inadequate number of viral copies(<138 copies/mL). A negative  result must be combined with clinical observations, patient history, and epidemiological information. The expected result is Negative.  Fact Sheet for Patients:  EntrepreneurPulse.com.au  Fact Sheet for Healthcare Providers:  IncredibleEmployment.be  This test is no t yet approved or cleared by the [place] FDA and  has been authorized for detection and/or diagnosis of SARS-CoV-2 by FDA under an Emergency Use Authorization (EUA). This EUA will remain  in effect (meaning this test can be used) for the duration of the COVID-19 declaration under Section 564(b)(1) of the Act, 21 U.S.C.section 360bbb-3(b)(1), unless the authorization is terminated  or revoked sooner.       Influenza A by PCR NEGATIVE NEGATIVE Final   Influenza B by PCR NEGATIVE NEGATIVE Final    Comment: (NOTE) The Xpert Xpress SARS-CoV-2/FLU/RSV plus assay is intended as an aid in the diagnosis of influenza from Nasopharyngeal swab specimens and  should not be used as a sole basis for treatment. Nasal washings and aspirates are unacceptable for Xpert Xpress SARS-CoV-2/FLU/RSV testing.  Fact Sheet for Patients: EntrepreneurPulse.com.au  Fact Sheet for Healthcare Providers: IncredibleEmployment.be  This test is not yet approved or cleared by the [place] FDA and has been authorized for detection and/or diagnosis of SARS-CoV-2 by FDA under an Emergency Use Authorization (EUA). This EUA will remain in effect (meaning this test can be used) for the duration of the COVID-19 declaration under Section 564(b)(1) of the Act, 21 U.S.C. section 360bbb-3(b)(1), unless the authorization is terminated or revoked.  Performed at [facility], [address], [place], [place] [address]   Urine Culture     Status: None   Collection Time: 03/21/21  4:58 PM   Specimen: Urine, Clean Catch  Result Value Ref Range Status    Specimen Description   Final    URINE, CLEAN CATCH Performed at [facility], [address] [place], [place] [address]    Special Requests   Final    NONE Performed at [facility], [address] [place], [place] [address]    Culture   Final    NO GROWTH Performed at [facility], [address] [place], [place] [address]    Report Status 03/23/2021 FINAL  Final  Gastrointestinal Panel by PCR , Stool     Status: None   Collection Time: 03/22/21 11:07 AM   Specimen: Stool  Result Value Ref Range Status   Campylobacter species NOT DETECTED NOT DETECTED Final   Plesimonas shigelloides NOT DETECTED NOT DETECTED Final   Salmonella species NOT DETECTED NOT DETECTED Final   Yersinia enterocolitica NOT DETECTED NOT DETECTED Final   Vibrio species NOT DETECTED NOT DETECTED Final   Vibrio cholerae NOT DETECTED NOT DETECTED Final   Enteroaggregative E coli (EAEC) NOT DETECTED NOT DETECTED Final   Enteropathogenic E coli (EPEC) NOT DETECTED NOT DETECTED Final   Enterotoxigenic E coli (ETEC) NOT DETECTED NOT DETECTED Final   Shiga like toxin producing E coli (STEC) NOT DETECTED NOT DETECTED Final   Shigella/Enteroinvasive E coli (EIEC) NOT DETECTED NOT DETECTED Final   Cryptosporidium NOT DETECTED NOT DETECTED Final   Cyclospora cayetanensis NOT DETECTED NOT DETECTED Final   Entamoeba histolytica NOT DETECTED NOT DETECTED Final   Giardia lamblia NOT DETECTED NOT DETECTED Final   Adenovirus F40/41 NOT DETECTED NOT DETECTED Final   Astrovirus NOT DETECTED NOT DETECTED Final   Norovirus GI/GII NOT DETECTED NOT DETECTED Final   Rotavirus A NOT DETECTED NOT DETECTED Final   Sapovirus (I, II, IV, and V) NOT DETECTED NOT DETECTED Final    Comment: Performed at [facility], [address] [place], [place] [address]         Radiology Studies: CT ABDOMEN PELVIS W CONTRAST  Result Date: 03/21/2021 CLINICAL DATA:  LUQ abdominal pain  History of previous Whipple surgery, left upper quadrant abdominal pain and tenderness with swelling and some altered mental status. Concern for internal herniation versus obstruction versus other. EXAM: CT ABDOMEN AND PELVIS WITH CONTRAST TECHNIQUE: Multidetector CT imaging of the abdomen and pelvis was performed using the standard protocol following bolus administration of intravenous contrast. CONTRAST:  176mL OMNIPAQUE IOHEXOL 300 MG/ML  SOLN COMPARISON:  PET CT 07/21/2012 FINDINGS: Lower chest: Bibasilar atelectasis. Hepatobiliary: No focal liver abnormality. Status post cholecystectomy. No biliary dilatation. Pneumobilia. Pancreas: Status post Whipple procedure. Remaining pancreatic parenchyma is markedly diffusely atrophic. No focal lesion. Otherwise normal pancreatic  contour. No surrounding inflammatory changes. No main pancreatic ductal dilatation. Spleen: Normal in size without focal abnormality. Adrenals/Urinary Tract: No adrenal nodule bilaterally. Bilateral kidneys enhance symmetrically. No hydronephrosis. No hydroureter. The urinary bladder is unremarkable. On delayed imaging, there is no urothelial wall thickening and there are no filling defects in the opacified portions of the bilateral collecting systems or ureters. Stomach/Bowel: Status post Whipple procedure. Stomach is within normal limits. Focal short loop of small bowel within the right upper quadrant dilated with fecalized material measuring up to 5.5 cm adjacent to anastomosis. No evidence of large bowel wall thickening or dilatation. Appendix appears normal. Vascular/Lymphatic: No abdominal aorta or iliac aneurysm. Moderate to severe atherosclerotic plaque of the aorta and its branches. No abdominal, pelvic, or inguinal lymphadenopathy. Reproductive: Prostate is unremarkable. Other: No intraperitoneal free fluid. No intraperitoneal free gas. No organized fluid collection. Musculoskeletal: Small to moderate volume left inguinal hernia  containing a portion of the urinary bladder. IMPRESSION: 1. Small to moderate volume left inguinal hernia containing a portion of the urinary bladder. Correlate with physical exam for incarceration. 2. Focal short loop of small bowel within the right upper quadrant dilated with fecalized material. Finding may represent a developing obstruction versus may be normal variant in the setting of postsurgical changes. 3.  Aortic Atherosclerosis (ICD10-I70.0). Electronically Signed   By: Iven Finn M.D.   On: 03/21/2021 16:34   DG Chest Portable 1 View  Result Date: 03/21/2021 CLINICAL DATA:  Infection evaluation. EXAM: PORTABLE CHEST 1 VIEW COMPARISON:  Chest CT 07/21/2012 FINDINGS: The cardiac silhouette is normal in size. There are mild interstitial densities bilaterally most notable in the peripheral left mid and lower lung. No consolidative airspace opacity, pleural effusion, or pneumothorax is identified. No acute osseous abnormality is seen. IMPRESSION: Mild interstitial densities in the left greater than right lungs which could reflect atypical/viral infection or early edema. Electronically Signed   By: Logan Bores M.D.   On: 03/21/2021 19:15   US Abdomen Limited RUQ (LIVER/GB)  Result Date: 03/22/2021 CLINICAL DATA:  Elevated liver enzymes. Status post Whipple procedure. EXAM: ULTRASOUND ABDOMEN LIMITED RIGHT UPPER QUADRANT COMPARISON:  03/21/2021. FINDINGS: Gallbladder: Surgically absent. Common bile duct: Diameter: 2.7 mm Liver: No focal lesion identified. Within normal limits in parenchymal echogenicity. Portal vein is patent on color Doppler imaging with normal direction of blood flow towards the liver. Other: No free fluid. IMPRESSION: 1. No acute abnormality. 2. Status post cholecystectomy. Electronically Signed   By: Brett Fairy M.D.   On: 03/22/2021 04:17        Scheduled Meds:  aspirin EC  81 mg Oral Daily   enoxaparin (LOVENOX) injection  40 mg Subcutaneous Q24H   insulin pump    Subcutaneous TID WC, HS, 0200   lipase/protease/amylase  120,000 Units Oral TID WC   nebivolol  10 mg Oral Daily   pantoprazole  40 mg Oral Daily   Continuous Infusions:  sodium chloride     piperacillin-tazobactam (ZOSYN)  IV 3.375 g (03/23/21 1019)     LOS: 1 day    Time spent: 50min    Domenic Polite, MD [facility]   03/23/2021, 11:52 AM

## 2021-03-23 NOTE — Progress Notes (Signed)
Progress Note     Subjective: Patient denies abdominal pain. Denies n/v. Having loose BMs.   Objective: Vital signs in last 24 hours: Temp:  [98.8 F (37.1 C)-99.2 F (37.3 C)] 99.2 F (37.3 C) (11/09 2013) Pulse Rate:  [77-81] 81 (11/09 2013) Resp:  [17-19] 17 (11/09 2013) BP: (147-157)/(56-58) 157/58 (11/09 2013) SpO2:  [92 %-95 %] 92 % (11/09 2013) Last BM Date: 03/22/21  Intake/Output from previous day: 11/09 0701 - 11/10 0700 In: 250.5 [I.V.:206.3; IV Piggyback:44.2] Out: -  Intake/Output this shift: No intake/output data recorded.  PE: General: pleasant, WD, overweight male who is laying in bed in NAD Heart: regular, rate, and rhythm.   Lungs: Respiratory effort nonlabored Abd: soft, NT, ND, +BS, midline surgical scar MS: all 4 extremities are symmetrical with no cyanosis, clubbing, or edema. Psych: A&Ox3 with an appropriate affect.   Lab Results:  Recent Labs    03/22/21 0555 03/23/21 0249  WBC 15.5* 13.3*  HGB 12.7* 12.1*  HCT 36.0* 35.3*  PLT 165 151   BMET Recent Labs    03/22/21 0555 03/23/21 0249  NA 129* 128*  K 3.9 3.7  CL 99 99  CO2 23 20*  GLUCOSE 137* 133*  BUN 14 16  CREATININE 1.42* 1.25*  CALCIUM 8.0* 7.7*   PT/INR No results for input(s): LABPROT, INR in the last 72 hours. CMP     Component Value Date/Time   NA 128 (L) 03/23/2021 0249   NA 142 07/16/2012 1124   K 3.7 03/23/2021 0249   K 4.2 07/16/2012 1124   CL 99 03/23/2021 0249   CL 106 07/16/2012 1124   CO2 20 (L) 03/23/2021 0249   CO2 27 07/16/2012 1124   GLUCOSE 133 (H) 03/23/2021 0249   GLUCOSE 76 07/16/2012 1124   BUN 16 03/23/2021 0249   BUN 14.7 07/16/2012 1124   CREATININE 1.25 (H) 03/23/2021 0249   CREATININE 1.0 07/16/2012 1124   CALCIUM 7.7 (L) 03/23/2021 0249   CALCIUM 9.4 07/16/2012 1124   PROT 5.4 (L) 03/23/2021 0249   PROT 7.0 07/16/2012 1124   ALBUMIN 2.5 (L) 03/23/2021 0249   ALBUMIN 3.0 (L) 07/16/2012 1124   AST 131 (H) 03/23/2021 0249    AST 173 (H) 07/16/2012 1124   ALT 225 (H) 03/23/2021 0249   ALT 156 (H) 07/16/2012 1124   ALKPHOS 184 (H) 03/23/2021 0249   ALKPHOS 715 (H) 07/16/2012 1124   BILITOT 2.8 (H) 03/23/2021 0249   BILITOT 2.52 (H) 07/16/2012 1124   GFRNONAA >60 03/23/2021 0249   GFRAA >60 08/24/2016 1520   Lipase     Component Value Date/Time   LIPASE <10 (L) 03/21/2021 1425       Studies/Results: CT ABDOMEN PELVIS W CONTRAST  Result Date: 03/21/2021 CLINICAL DATA:  LUQ abdominal pain History of previous Whipple surgery, left upper quadrant abdominal pain and tenderness with swelling and some altered mental status. Concern for internal herniation versus obstruction versus other. EXAM: CT ABDOMEN AND PELVIS WITH CONTRAST TECHNIQUE: Multidetector CT imaging of the abdomen and pelvis was performed using the standard protocol following bolus administration of intravenous contrast. CONTRAST:  173mL OMNIPAQUE IOHEXOL 300 MG/ML  SOLN COMPARISON:  PET CT 07/21/2012 FINDINGS: Lower chest: Bibasilar atelectasis. Hepatobiliary: No focal liver abnormality. Status post cholecystectomy. No biliary dilatation. Pneumobilia. Pancreas: Status post Whipple procedure. Remaining pancreatic parenchyma is markedly diffusely atrophic. No focal lesion. Otherwise normal pancreatic contour. No surrounding inflammatory changes. No main pancreatic ductal dilatation. Spleen: Normal in size without focal  abnormality. Adrenals/Urinary Tract: No adrenal nodule bilaterally. Bilateral kidneys enhance symmetrically. No hydronephrosis. No hydroureter. The urinary bladder is unremarkable. On delayed imaging, there is no urothelial wall thickening and there are no filling defects in the opacified portions of the bilateral collecting systems or ureters. Stomach/Bowel: Status post Whipple procedure. Stomach is within normal limits. Focal short loop of small bowel within the right upper quadrant dilated with fecalized material measuring up to 5.5 cm  adjacent to anastomosis. No evidence of large bowel wall thickening or dilatation. Appendix appears normal. Vascular/Lymphatic: No abdominal aorta or iliac aneurysm. Moderate to severe atherosclerotic plaque of the aorta and its branches. No abdominal, pelvic, or inguinal lymphadenopathy. Reproductive: Prostate is unremarkable. Other: No intraperitoneal free fluid. No intraperitoneal free gas. No organized fluid collection. Musculoskeletal: Small to moderate volume left inguinal hernia containing a portion of the urinary bladder. IMPRESSION: 1. Small to moderate volume left inguinal hernia containing a portion of the urinary bladder. Correlate with physical exam for incarceration. 2. Focal short loop of small bowel within the right upper quadrant dilated with fecalized material. Finding may represent a developing obstruction versus may be normal variant in the setting of postsurgical changes. 3.  Aortic Atherosclerosis (ICD10-I70.0). Electronically Signed   By: Iven Finn M.D.   On: 03/21/2021 16:34   DG Chest Portable 1 View  Result Date: 03/21/2021 CLINICAL DATA:  Infection evaluation. EXAM: PORTABLE CHEST 1 VIEW COMPARISON:  Chest CT 07/21/2012 FINDINGS: The cardiac silhouette is normal in size. There are mild interstitial densities bilaterally most notable in the peripheral left mid and lower lung. No consolidative airspace opacity, pleural effusion, or pneumothorax is identified. No acute osseous abnormality is seen. IMPRESSION: Mild interstitial densities in the left greater than right lungs which could reflect atypical/viral infection or early edema. Electronically Signed   By: Logan Bores M.D.   On: 03/21/2021 19:15   US Abdomen Limited RUQ (LIVER/GB)  Result Date: 03/22/2021 CLINICAL DATA:  Elevated liver enzymes. Status post Whipple procedure. EXAM: ULTRASOUND ABDOMEN LIMITED RIGHT UPPER QUADRANT COMPARISON:  03/21/2021. FINDINGS: Gallbladder: Surgically absent. Common bile duct: Diameter:  2.7 mm Liver: No focal lesion identified. Within normal limits in parenchymal echogenicity. Portal vein is patent on color Doppler imaging with normal direction of blood flow towards the liver. Other: No free fluid. IMPRESSION: 1. No acute abnormality. 2. Status post cholecystectomy. Electronically Signed   By: Brett Fairy M.D.   On: 03/22/2021 04:17    Anti-infectives: Anti-infectives (From admission, onward)    Start     Dose/Rate Route Frequency Ordered Stop   03/22/21 1800  vancomycin (VANCOREADY) IVPB 1250 mg/250 mL  Status:  Discontinued        1,250 mg 166.7 mL/hr over 90 Minutes Intravenous Every 24 hours 03/21/21 1813 03/22/21 0506   03/22/21 0600  piperacillin-tazobactam (ZOSYN) IVPB 3.375 g  Status:  Discontinued        3.375 g 12.5 mL/hr over 240 Minutes Intravenous Every 8 hours 03/22/21 0444 03/22/21 0449   03/22/21 0000  piperacillin-tazobactam (ZOSYN) IVPB 3.375 g        3.375 g 12.5 mL/hr over 240 Minutes Intravenous Every 8 hours 03/21/21 1603     03/21/21 1815  vancomycin (VANCOCIN) IVPB 1000 mg/200 mL premix  Status:  Discontinued       See Hyperspace for full Linked Orders Report.   1,000 mg 200 mL/hr over 60 Minutes Intravenous  Once 03/21/21 1811 03/22/21 0831   03/21/21 1815  vancomycin (VANCOCIN) IVPB 1000 mg/200 mL  premix       See Hyperspace for full Linked Orders Report.   1,000 mg 200 mL/hr over 60 Minutes Intravenous  Once 03/21/21 1811 03/21/21 2352   03/21/21 1615  piperacillin-tazobactam (ZOSYN) IVPB 3.375 g        3.375 g 100 mL/hr over 30 Minutes Intravenous  Once 03/21/21 1601 03/21/21 2041        Assessment/Plan Bacteremia - blood cxs with klebsiella, E.coli and enterobacterales; urine cx pending but unclear source at this time - WBC 13, VSS PSBO vs possible enteritis  - focal short loop of small bowel in RUQ - patient no longer having abdominal pain or nausea, having diarrhea - tol FLD, ok to advance to CM diet - GI panel  pending - no  indication for emergency general surgery intervention at this time [facility] containing urinary bladder - having some incomplete emptying at times, urine cx without growth - will need outpatient general surgery follow up to discuss elective inguinal hernia repair    FEN: CM diet, IVF @75cc /h per primary  VTE: LMWH ID: [email or web address]   T1DM  HTN 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

## 2021-03-24 DIAGNOSIS — K56609 Unspecified intestinal obstruction, unspecified as to partial versus complete obstruction: Secondary | ICD-10-CM | POA: Diagnosis not present

## 2021-03-24 LAB — COMPREHENSIVE METABOLIC PANEL
ALT: 158 U/L — ABNORMAL HIGH (ref 0–44)
AST: 66 U/L — ABNORMAL HIGH (ref 15–41)
Albumin: 2.5 g/dL — ABNORMAL LOW (ref 3.5–5.0)
Alkaline Phosphatase: 194 U/L — ABNORMAL HIGH (ref 38–126)
Anion gap: 8 (ref 5–15)
BUN: 16 mg/dL (ref 8–23)
CO2: 22 mmol/L (ref 22–32)
Calcium: 7.8 mg/dL — ABNORMAL LOW (ref 8.9–10.3)
Chloride: 97 mmol/L — ABNORMAL LOW (ref 98–111)
Creatinine, Ser: 1.26 mg/dL — ABNORMAL HIGH (ref 0.61–1.24)
GFR, Estimated: >60 mL/min (ref >60)
Glucose, Bld: 66 mg/dL — ABNORMAL LOW (ref 70–99)
Potassium: 3.5 mmol/L (ref 3.5–5.1)
Sodium: 127 mmol/L — ABNORMAL LOW (ref 135–145)
Total Bilirubin: 1.8 mg/dL — ABNORMAL HIGH (ref 0.3–1.2)
Total Protein: 5.6 g/dL — ABNORMAL LOW (ref 6.5–8.1)

## 2021-03-24 LAB — CBC
HCT: 36.3 % — ABNORMAL LOW (ref 39.0–52.0)
Hemoglobin: 12.3 g/dL — ABNORMAL LOW (ref 13.0–17.0)
MCH: 28.4 pg (ref 26.0–34.0)
MCHC: 33.9 g/dL (ref 30.0–36.0)
MCV: 83.8 fL (ref 80.0–100.0)
Platelets: 173 10*3/uL (ref 150–400)
RBC: 4.33 MIL/uL (ref 4.22–5.81)
RDW: 13.6 % (ref 11.5–15.5)
WBC: 13.6 10*3/uL — ABNORMAL HIGH (ref 4.0–10.5)
nRBC: 0 % (ref 0.0–0.2)

## 2021-03-24 LAB — GLUCOSE, CAPILLARY
Glucose-Capillary: 133 mg/dL — ABNORMAL HIGH (ref 70–99)
Glucose-Capillary: 202 mg/dL — ABNORMAL HIGH (ref 70–99)
Glucose-Capillary: 176 mg/dL — ABNORMAL HIGH (ref 70–99)
Glucose-Capillary: 142 mg/dL — ABNORMAL HIGH (ref 70–99)

## 2021-03-24 MED ORDER — PANCRELIPASE (LIP-PROT-AMYL) 36000-114000 UNITS PO CPEP
120000.0000 [IU] | ORAL_CAPSULE | Freq: Three times a day (TID) | ORAL | Status: DC
  Administered 2021-03-24 – 2021-03-25 (×3): 120000 [IU] via ORAL
  Filled 2021-03-24 (×5): qty 1

## 2021-03-24 MED ORDER — POTASSIUM CHLORIDE CRYS ER 20 MEQ PO TBCR
40.0000 meq | EXTENDED_RELEASE_TABLET | Freq: Once | ORAL | Status: AC
  Administered 2021-03-24: 40 meq via ORAL
  Filled 2021-03-24: qty 2

## 2021-03-24 MED ORDER — FUROSEMIDE 10 MG/ML IJ SOLN
20.0000 mg | Freq: Once | INTRAMUSCULAR | Status: AC
  Administered 2021-03-24: 20 mg via INTRAVENOUS
  Filled 2021-03-24: qty 2

## 2021-03-24 MED ORDER — PANCRELIPASE (LIP-PROT-AMYL) 36000-114000 UNITS PO CPEP
72000.0000 [IU] | ORAL_CAPSULE | Freq: Three times a day (TID) | ORAL | Status: DC | PRN
  Filled 2021-03-24: qty 2

## 2021-03-24 MED ORDER — ACETAMINOPHEN 325 MG PO TABS: 650.0000 mg | ORAL_TABLET | Freq: Four times a day (QID) | ORAL | Status: DC | PRN

## 2021-03-24 NOTE — Plan of Care (Signed)

## 2021-03-24 NOTE — Progress Notes (Signed)
Progress Note     Subjective: Patient reports he is feeling much better. He denies abdominal pain. He denies nausea or vomiting. He is having bowel movements. He was able to tolerate pizza yesterday. He is hopeful to go home today.   Objective: Vital signs in last 24 hours: Temp:  [98.1 F (36.7 C)-98.6 F (37 C)] 98.6 F (37 C) (11/11 0626) Pulse Rate:  [71-77] 71 (11/11 0626) Resp:  [20] 20 (11/11 0626) BP: (155-173)/(60-80) 155/60 (11/11 0626) SpO2:  [91 %-93 %] 93 % (11/11 0626) Last BM Date: 03/22/21  Intake/Output from previous day: 11/10 0701 - 11/11 0700 In: 150 [I.V.:150] Out: 600 [Urine:600] Intake/Output this shift: No intake/output data recorded.  PE: General: pleasant, WD, overweight male who is laying in bed in NAD Heart: regular, rate, and rhythm.   Lungs: Respiratory effort nonlabored Abd: soft, NT, ND, +BS, midline surgical scar MS: all 4 extremities are symmetrical with no cyanosis, clubbing, or edema. Psych: A&Ox3 with an appropriate affect.   Lab Results:  Recent Labs    03/23/21 0249 03/24/21 0155  WBC 13.3* 13.6*  HGB 12.1* 12.3*  HCT 35.3* 36.3*  PLT 151 173   BMET Recent Labs    03/23/21 0249 03/24/21 0155  NA 128* 127*  K 3.7 3.5  CL 99 97*  CO2 20* 22  GLUCOSE 133* 66*  BUN 16 16  CREATININE 1.25* 1.26*  CALCIUM 7.7* 7.8*   PT/INR No results for input(s): LABPROT, INR in the last 72 hours. CMP     Component Value Date/Time   NA 127 (L) 03/24/2021 0155   NA 142 07/16/2012 1124   K 3.5 03/24/2021 0155   K 4.2 07/16/2012 1124   CL 97 (L) 03/24/2021 0155   CL 106 07/16/2012 1124   CO2 22 03/24/2021 0155   CO2 27 07/16/2012 1124   GLUCOSE 66 (L) 03/24/2021 0155   GLUCOSE 76 07/16/2012 1124   BUN 16 03/24/2021 0155   BUN 14.7 07/16/2012 1124   CREATININE 1.26 (H) 03/24/2021 0155   CREATININE 1.0 07/16/2012 1124   CALCIUM 7.8 (L) 03/24/2021 0155   CALCIUM 9.4 07/16/2012 1124   PROT 5.6 (L) 03/24/2021 0155   PROT  7.0 07/16/2012 1124   ALBUMIN 2.5 (L) 03/24/2021 0155   ALBUMIN 3.0 (L) 07/16/2012 1124   AST 66 (H) 03/24/2021 0155   AST 173 (H) 07/16/2012 1124   ALT 158 (H) 03/24/2021 0155   ALT 156 (H) 07/16/2012 1124   ALKPHOS 194 (H) 03/24/2021 0155   ALKPHOS 715 (H) 07/16/2012 1124   BILITOT 1.8 (H) 03/24/2021 0155   BILITOT 2.52 (H) 07/16/2012 1124   GFRNONAA >60 03/24/2021 0155   GFRAA >60 08/24/2016 1520   Lipase     Component Value Date/Time   LIPASE <10 (L) 03/21/2021 1425       Studies/Results: No results found.  Anti-infectives: Anti-infectives (From admission, onward)    Start     Dose/Rate Route Frequency Ordered Stop   03/22/21 1800  vancomycin (VANCOREADY) IVPB 1250 mg/250 mL  Status:  Discontinued        1,250 mg 166.7 mL/hr over 90 Minutes Intravenous Every 24 hours 03/21/21 1813 03/22/21 0506   03/22/21 0600  piperacillin-tazobactam (ZOSYN) IVPB 3.375 g  Status:  Discontinued        3.375 g 12.5 mL/hr over 240 Minutes Intravenous Every 8 hours 03/22/21 0444 03/22/21 0449   03/22/21 0000  piperacillin-tazobactam (ZOSYN) IVPB 3.375 g  3.375 g 12.5 mL/hr over 240 Minutes Intravenous Every 8 hours 03/21/21 1603     03/21/21 1815  vancomycin (VANCOCIN) IVPB 1000 mg/200 mL premix  Status:  Discontinued       See Hyperspace for full Linked Orders Report.   1,000 mg 200 mL/hr over 60 Minutes Intravenous  Once 03/21/21 1811 03/22/21 0831   03/21/21 1815  vancomycin (VANCOCIN) IVPB 1000 mg/200 mL premix       See Hyperspace for full Linked Orders Report.   1,000 mg 200 mL/hr over 60 Minutes Intravenous  Once 03/21/21 1811 03/21/21 2352   03/21/21 1615  piperacillin-tazobactam (ZOSYN) IVPB 3.375 g        3.375 g 100 mL/hr over 30 Minutes Intravenous  Once 03/21/21 1601 03/21/21 2041        Assessment/Plan Bacteremia - blood cxs with klebsiella, E.coli and enterobacterales; urine cx pending but unclear source at this time - WBC 13, VSS - abx per ID and  primary attending  PSBO vs possible enteritis  - focal short loop of small bowel in RUQ - patient no longer having abdominal pain or nausea, having bowel function and tolerating CM diet - GI panel negative - no indication for emergency general surgery intervention at this time, stable for discharge from a surgical perspective  LI[facility] containing urinary bladder - having some incomplete emptying at times, urine cx without growth - will need outpatient general surgery follow up to discuss elective inguinal hernia repair    FEN: CM diet VTE: LMWH ID: [email or web address]   T1DM  HTN 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

## 2021-03-24 NOTE — Progress Notes (Signed)
  Mobility Specialist Criteria Algorithm Info.  Mobility Team: [facility] elevated:Self regulated Activity: Ambulated in hall; Dangled on edge of bed Range of motion: Active; All extremities Level of assistance: Modified independent, requires aide device or extra time Assistive device: Other (Comment) (IV Pole) Distance ambulated (ft): 300 ft Mobility response: Tolerated well Bed Position: Chair  Patient received in bed eager to participate in mobility. Ambulated in hallway independently with steady gait. Was 2/4 dyspneic while ambulating and coughing slightly. Upon returning to room oxygen saturation was 88%. After a minute of seated rest saturation recovered to >93%. Tolerated ambulation well without incident and was left dangling EOB with all needs met.  03/24/2021 11:30 AM

## 2021-03-24 NOTE — Progress Notes (Addendum)
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 -In the ED he was found to be febrile, tachycardic with leukocytosis, lactic acidosis -CT abdomen pelvis noted focal dilated loop of small bowel and small to moderate volume left inguinal hernia containing a portion of the urinary bladder   Assessment & Plan:   Transient PSBO Sepsis E. coli and Klebsiella bacteremia -Clinically suspect transient PSBO on 11/8 with resultant translocation of bacteria, sepsis and bacteremia -Clinically improving, symptoms have resolved, afebrile, leukocytosis improving -General surgery following, advance diet -Continue IV Zosyn, follow-up sensitivities of bacteria, -Unfortunately sensitivities are still pending, called microbiology lab, this should be reported out tomorrow, discussed with ID, recommended to continue antibiotics for 7 to 10 days total   Left inguinal hernia containing urinary bladder -History of incomplete bladder emptying and urinary frequency for several months -urine Cx negative -Will need CCS Fu for hernia repair    Type 1 diabetes mellitus -On insulin pump, this was continued, protocol ordered   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 -Continue pancreatic enzymes   Mild AKI -Secondary to above, improving, , hold ARB -IV Lasix x1 today for iatrogenic edema, from fluid resuscitation for sepsis   Abnormal LFTs -Likely secondary to sepsis, prior cholecystectomy -No biliary dilation noted on CT, or [place] -improving, continue to trend -GI consulted per pt request, appreciate gastroenterology  evaluation  LBBB -Chronicity unknown, patient denies any symptoms, troponin negative on admission -Will need cardiology follow-up  Chronic hyponatremia   DVT prophylaxis: Lovenox Code Status: Full code Family Communication: Discussed patient in detail, no family at bedside Disposition Plan:  Status is: Inpatient  Remains inpatient appropriate because: Severity of illness   Consultants:  General surgery  Procedures:   Antimicrobials:    Subjective: -Feels okay overall, eating better, having BMs  Objective: Vitals:   03/23/21 2116 03/24/21 0626 03/24/21 1139 03/24/21 1143  BP: (!) 173/80 (!) 155/60 130/64 130/64  Pulse: 76 71 72 72  Resp: 20 20  18   Temp: 98.3 F (36.8 C) 98.6 F (37 C) 98.1 F (36.7 C) 98.1 F (36.7 C)  TempSrc: Oral Oral  Oral  SpO2: 93% 93% 94% 94%  Weight:      Height:        Intake/Output Summary (Last 24 hours) at 03/24/2021 1450 Last data filed at 03/24/2021 1347 Gross per 24 hour  Intake 150 ml  Output 800 ml  Net -650 ml   Filed Weights   03/21/21 1356  Weight: 97.1 kg    Examination:  General exam: Male sitting up in bed smiling x3, no distress HEENT: No JVD CVS: S1-S2, regular rate rhythm Lungs: Decreased breath sounds at bases Abdomen: Soft, nontender, normal surgical scar abdominal wall defect, nontender, bowel sounds present Extremities: No edema  Skin: No rash on exposed skin  Data Reviewed:   CBC: Recent Labs  Lab 03/21/21 1425 03/22/21 0555 03/23/21 0249 03/24/21 0155  WBC 18.1* 15.5* 13.3* 13.6*  HGB 14.1 12.7* 12.1* 12.3*  HCT 41.3 36.0* 35.3* 36.3*  MCV 83.3 83.1 84.0 83.8  PLT 228 165 151 453   Basic Metabolic Panel: Recent Labs  Lab 03/21/21 1425 03/22/21  6333 03/23/21 0249 03/24/21 0155  NA 128* 129* 128* 127*  K 4.8 3.9 3.7 3.5  CL 94* 99 99 97*  CO2 21* 23 20* 22  GLUCOSE 190* 137* 133* 66*  BUN 15 14 16 16   CREATININE 1.31* 1.42* 1.25* 1.26*  CALCIUM 9.3 8.0* 7.7* 7.8*    GFR: Estimated Creatinine Clearance: 70.8 mL/min (A) (by C-G formula based on SCr of 1.26 mg/dL (H)). Liver Function Tests: Recent Labs  Lab 03/21/21 1425 03/22/21 0555 03/23/21 0249 03/24/21 0155  AST 623* 271* 131* 66*  ALT 546* 346* 225* 158*  ALKPHOS 365* 203* 184* 194*  BILITOT 3.1* 4.0* 2.8* 1.8*  PROT 6.7 5.5* 5.4* 5.6*  ALBUMIN 3.9 2.6* 2.5* 2.5*   Recent Labs  Lab 03/21/21 1425  LIPASE <10*   No results for input(s): AMMONIA in the last 168 hours. Coagulation Profile: No results for input(s): INR, PROTIME in the last 168 hours. Cardiac Enzymes: No results for input(s): CKTOTAL, CKMB, CKMBINDEX, TROPONINI in the last 168 hours. BNP (last 3 results) No results for input(s): PROBNP in the last 8760 hours. HbA1C: No results for input(s): HGBA1C in the last 72 hours. CBG: Recent Labs  Lab 03/23/21 1144 03/23/21 1559 03/23/21 2113 03/24/21 0744 03/24/21 1135  GLUCAP 131* 146* 141* 133* 202*   Lipid Profile: No results for input(s): CHOL, HDL, LDLCALC, TRIG, CHOLHDL, LDLDIRECT in the last 72 hours. Thyroid Function Tests: No results for input(s): TSH, T4TOTAL, FREET4, T3FREE, THYROIDAB in the last 72 hours. Anemia Panel: No results for input(s): VITAMINB12, FOLATE, FERRITIN, TIBC, IRON, RETICCTPCT in the last 72 hours. Urine analysis:    Component Value Date/Time   COLORURINE YELLOW 03/21/2021 1658   APPEARANCEUR CLEAR 03/21/2021 1658   LABSPEC 1.037 (H) 03/21/2021 1658   PHURINE 8.5 (H) 03/21/2021 1658   GLUCOSEU 250 (A) 03/21/2021 1658   HGBUR NEGATIVE 03/21/2021 1658   BILIRUBINUR SMALL (A) 03/21/2021 1658   KETONESUR 15 (A) 03/21/2021 1658   PROTEINUR 30 (A) 03/21/2021 1658   NITRITE NEGATIVE 03/21/2021 1658   LEUKOCYTESUR NEGATIVE 03/21/2021 1658   Sepsis Labs: @LABRCNTIP (procalcitonin:4,lacticidven:4)  ) Recent Results (from the past 240 hour(s))  Blood culture (routine x 2)     Status: Abnormal (Preliminary result)   Collection Time:  03/21/21  2:44 PM   Specimen: BLOOD  Result Value Ref Range Status   Specimen Description BLOOD LEFT ANTECUBITAL  Final   Special Requests   Final    BOTTLES DRAWN AEROBIC AND ANAEROBIC Blood Culture adequate volume   Culture  Setup Time   Final    GRAM NEGATIVE RODS IN BOTH AEROBIC AND ANAEROBIC BOTTLES CRITICAL RESULT CALLED TO, READ BACK BY AND VERIFIED WITH: PHARMD JAMES LEDFORD 03/22/21@4 :51 BY TW    Culture (A)  Final    ESCHERICHIA COLI KLEBSIELLA OXYTOCA SUSCEPTIBILITIES TO FOLLOW Performed at [facility], [place] [address], [place], [place] [address]    Report Status PENDING  Incomplete  Blood Culture ID Panel (Reflexed)     Status: Abnormal   Collection Time: 03/21/21  2:44 PM  Result Value Ref Range Status   Enterococcus faecalis NOT DETECTED NOT DETECTED Final   Enterococcus Faecium NOT DETECTED NOT DETECTED Final   Listeria monocytogenes NOT DETECTED NOT DETECTED Final   Staphylococcus species NOT DETECTED NOT DETECTED Final   Staphylococcus aureus (BCID) NOT DETECTED NOT DETECTED Final   Staphylococcus epidermidis NOT DETECTED NOT DETECTED Final   Staphylococcus lugdunensis NOT DETECTED NOT DETECTED Final   Streptococcus species NOT  DETECTED NOT DETECTED Final   Streptococcus agalactiae NOT DETECTED NOT DETECTED Final   Streptococcus pneumoniae NOT DETECTED NOT DETECTED Final   Streptococcus pyogenes NOT DETECTED NOT DETECTED Final   A.calcoaceticus-baumannii NOT DETECTED NOT DETECTED Final   Bacteroides fragilis NOT DETECTED NOT DETECTED Final   Enterobacterales DETECTED (A) NOT DETECTED Final    Comment: CRITICAL RESULT CALLED TO, READ BACK BY AND VERIFIED WITH: PHARMD JAMES LEDFORD 03/22/21@4 :51 BY TW    Enterobacter cloacae complex NOT DETECTED NOT DETECTED Final   Escherichia coli DETECTED (A) NOT DETECTED Final    Comment: CRITICAL RESULT CALLED TO, READ BACK BY AND VERIFIED WITH: PHARMD JAMES LEDFORD 03/22/21@4 :51 BY TW    Klebsiella aerogenes NOT  DETECTED NOT DETECTED Final   Klebsiella oxytoca DETECTED (A) NOT DETECTED Final    Comment: CRITICAL RESULT CALLED TO, READ BACK BY AND VERIFIED WITH: PHARMD JAMES LEDFORD 03/22/21@4 :51 BY TW    Klebsiella pneumoniae NOT DETECTED NOT DETECTED Final   Proteus species NOT DETECTED NOT DETECTED Final   Salmonella species NOT DETECTED NOT DETECTED Final   Serratia marcescens NOT DETECTED NOT DETECTED Final   Haemophilus influenzae NOT DETECTED NOT DETECTED Final   Neisseria meningitidis NOT DETECTED NOT DETECTED Final   Pseudomonas aeruginosa NOT DETECTED NOT DETECTED Final   Stenotrophomonas maltophilia NOT DETECTED NOT DETECTED Final   Candida albicans NOT DETECTED NOT DETECTED Final   Candida auris NOT DETECTED NOT DETECTED Final   Candida glabrata NOT DETECTED NOT DETECTED Final   Candida krusei NOT DETECTED NOT DETECTED Final   Candida parapsilosis NOT DETECTED NOT DETECTED Final   Candida tropicalis NOT DETECTED NOT DETECTED Final   Cryptococcus neoformans/gattii NOT DETECTED NOT DETECTED Final   CTX-M ESBL NOT DETECTED NOT DETECTED Final   Carbapenem resistance IMP NOT DETECTED NOT DETECTED Final   Carbapenem resistance KPC NOT DETECTED NOT DETECTED Final   Carbapenem resistance NDM NOT DETECTED NOT DETECTED Final   Carbapenem resist OXA 48 LIKE NOT DETECTED NOT DETECTED Final   Carbapenem resistance VIM NOT DETECTED NOT DETECTED Final    Comment: Performed at [facility], [place] [address] [place], [place] [address]  Resp Panel by RT-PCR (Flu A&B, Covid) Nasopharyngeal Swab     Status: None   Collection Time: 03/21/21  3:23 PM   Specimen: Nasopharyngeal Swab; Nasopharyngeal(NP) swabs in vial transport medium  Result Value Ref Range Status   SARS Coronavirus 2 by RT PCR NEGATIVE NEGATIVE Final    Comment: (NOTE) SARS-CoV-2 target nucleic acids are NOT DETECTED.  The SARS-CoV-2 RNA is generally detectable in upper respiratory specimens during the acute phase of infection.  The lowest concentration of SARS-CoV-2 viral copies this assay can detect is 138 copies/mL. A negative result does not preclude SARS-Cov-2 infection and should not be used as the sole basis for treatment or other patient management decisions. A negative result may occur with  improper specimen collection/handling, submission of specimen other than nasopharyngeal swab, presence of viral mutation(s) within the areas targeted by this assay, and inadequate number of viral copies(<138 copies/mL). A negative result must be combined with clinical observations, patient history, and epidemiological information. The expected result is Negative.  Fact Sheet for Patients:  EntrepreneurPulse.com.au  Fact Sheet for Healthcare Providers:  IncredibleEmployment.be  This test is no t yet approved or cleared by the [place] FDA and  has been authorized for detection and/or diagnosis of SARS-CoV-2 by FDA under an Emergency Use Authorization (EUA). This EUA will remain  in effect (  meaning this test can be used) for the duration of the COVID-19 declaration under Section 564(b)(1) of the Act, 21 U.S.C.section 360bbb-3(b)(1), unless the authorization is terminated  or revoked sooner.       Influenza A by PCR NEGATIVE NEGATIVE Final   Influenza B by PCR NEGATIVE NEGATIVE Final    Comment: (NOTE) The Xpert Xpress SARS-CoV-2/FLU/RSV plus assay is intended as an aid in the diagnosis of influenza from Nasopharyngeal swab specimens and should not be used as a sole basis for treatment. Nasal washings and aspirates are unacceptable for Xpert Xpress SARS-CoV-2/FLU/RSV testing.  Fact Sheet for Patients: EntrepreneurPulse.com.au  Fact Sheet for Healthcare Providers: IncredibleEmployment.be  This test is not yet approved or cleared by the [place] FDA and has been authorized for detection and/or diagnosis of SARS-CoV-2 by FDA  under an Emergency Use Authorization (EUA). This EUA will remain in effect (meaning this test can be used) for the duration of the COVID-19 declaration under Section 564(b)(1) of the Act, 21 U.S.C. section 360bbb-3(b)(1), unless the authorization is terminated or revoked.  Performed at [facility], [address] [place], [place] [address]   Urine Culture     Status: None   Collection Time: 03/21/21  4:58 PM   Specimen: Urine, Clean Catch  Result Value Ref Range Status   Specimen Description   Final    URINE, CLEAN CATCH Performed at [facility], [address] [place], [place] [address]    Special Requests   Final    NONE Performed at [facility], [address], [place], [place] [address]    Culture   Final    NO GROWTH Performed at [facility], [place] [address], [place], [place] [address]    Report Status 03/23/2021 FINAL  Final  Gastrointestinal Panel by PCR , Stool     Status: None   Collection Time: 03/22/21 11:07 AM   Specimen: Stool  Result Value Ref Range Status   Campylobacter species NOT DETECTED NOT DETECTED Final   Plesimonas shigelloides NOT DETECTED NOT DETECTED Final   Salmonella species NOT DETECTED NOT DETECTED Final   Yersinia enterocolitica NOT DETECTED NOT DETECTED Final   Vibrio species NOT DETECTED NOT DETECTED Final   Vibrio cholerae NOT DETECTED NOT DETECTED Final   Enteroaggregative E coli (EAEC) NOT DETECTED NOT DETECTED Final   Enteropathogenic E coli (EPEC) NOT DETECTED NOT DETECTED Final   Enterotoxigenic E coli (ETEC) NOT DETECTED NOT DETECTED Final   Shiga like toxin producing E coli (STEC) NOT DETECTED NOT DETECTED Final   Shigella/Enteroinvasive E coli (EIEC) NOT DETECTED NOT DETECTED Final   Cryptosporidium NOT DETECTED NOT DETECTED Final   Cyclospora cayetanensis NOT DETECTED NOT DETECTED Final   Entamoeba histolytica NOT DETECTED NOT DETECTED Final   Giardia lamblia  NOT DETECTED NOT DETECTED Final   Adenovirus F40/41 NOT DETECTED NOT DETECTED Final   Astrovirus NOT DETECTED NOT DETECTED Final   Norovirus GI/GII NOT DETECTED NOT DETECTED Final   Rotavirus A NOT DETECTED NOT DETECTED Final   Sapovirus (I, II, IV, and V) NOT DETECTED NOT DETECTED Final    Comment: Performed at [facility], [place] [place], [place] [address]     Scheduled Meds:  aspirin EC  81 mg Oral Daily   enoxaparin (LOVENOX) injection  40 mg Subcutaneous Q24H   insulin pump   Subcutaneous TID WC, HS, 0200   lipase/protease/amylase  120,000 Units Oral TID WC   nebivolol  10 mg  Oral Daily   pantoprazole  40 mg Oral Daily   Continuous Infusions:  piperacillin-tazobactam (ZOSYN)  IV 3.375 g (03/24/21 0927)     LOS: 2 days    Time spent: 59min    Domenic Polite, MD [facility]   03/24/2021, 2:50 PM

## 2021-03-25 DIAGNOSIS — I447 Left bundle-branch block, unspecified: Secondary | ICD-10-CM | POA: Diagnosis present

## 2021-03-25 DIAGNOSIS — K56609 Unspecified intestinal obstruction, unspecified as to partial versus complete obstruction: Secondary | ICD-10-CM | POA: Diagnosis not present

## 2021-03-25 DIAGNOSIS — N179 Acute kidney failure, unspecified: Secondary | ICD-10-CM | POA: Diagnosis not present

## 2021-03-25 LAB — COMPREHENSIVE METABOLIC PANEL
ALT: 113 U/L — ABNORMAL HIGH (ref 0–44)
AST: 44 U/L — ABNORMAL HIGH (ref 15–41)
Albumin: 2.3 g/dL — ABNORMAL LOW (ref 3.5–5.0)
Alkaline Phosphatase: 195 U/L — ABNORMAL HIGH (ref 38–126)
Anion gap: 8 (ref 5–15)
BUN: 16 mg/dL (ref 8–23)
CO2: 23 mmol/L (ref 22–32)
Calcium: 8 mg/dL — ABNORMAL LOW (ref 8.9–10.3)
Chloride: 98 mmol/L (ref 98–111)
Creatinine, Ser: 1.3 mg/dL — ABNORMAL HIGH (ref 0.61–1.24)
GFR, Estimated: >60 mL/min (ref >60)
Glucose, Bld: 66 mg/dL — ABNORMAL LOW (ref 70–99)
Potassium: 3.7 mmol/L (ref 3.5–5.1)
Sodium: 129 mmol/L — ABNORMAL LOW (ref 135–145)
Total Bilirubin: 1.1 mg/dL (ref 0.3–1.2)
Total Protein: 5.9 g/dL — ABNORMAL LOW (ref 6.5–8.1)

## 2021-03-25 LAB — CBC
HCT: 39.6 % (ref 39.0–52.0)
Hemoglobin: 13.4 g/dL (ref 13.0–17.0)
MCH: 28.5 pg (ref 26.0–34.0)
MCHC: 33.8 g/dL (ref 30.0–36.0)
MCV: 84.1 fL (ref 80.0–100.0)
Platelets: 185 10*3/uL (ref 150–400)
RBC: 4.71 MIL/uL (ref 4.22–5.81)
RDW: 13.7 % (ref 11.5–15.5)
WBC: 12.4 10*3/uL — ABNORMAL HIGH (ref 4.0–10.5)
nRBC: 0 % (ref 0.0–0.2)

## 2021-03-25 LAB — CULTURE, BLOOD (ROUTINE X 2): Special Requests: ADEQUATE

## 2021-03-25 LAB — GLUCOSE, CAPILLARY: Glucose-Capillary: 72 mg/dL (ref 70–99)

## 2021-03-25 MED ORDER — ALBUTEROL SULFATE HFA 108 (90 BASE) MCG/ACT IN AERS: 2.0000 | INHALATION_SPRAY | Freq: Four times a day (QID) | RESPIRATORY_TRACT | 1 refills | Status: AC | PRN

## 2021-03-25 MED ORDER — CEFDINIR 300 MG PO CAPS: 300.0000 mg | ORAL_CAPSULE | Freq: Two times a day (BID) | ORAL | 0 refills | Status: AC

## 2021-03-25 MED ORDER — VALSARTAN 80 MG PO TABS
80.0000 mg | ORAL_TABLET | Freq: Every day | ORAL | 0 refills | Status: AC
Start: 2021-03-25 — End: ?

## 2021-03-26 NOTE — Discharge Summary (Addendum)
Physician Discharge Summary  Robert Mercer JJH:[id number] DOB: Nov 15, 1960 DOA: 03/21/2021  PCP: Robert Pao, MD  Admit date: 03/21/2021 Discharge date: 03/25/2021  Time spent: 35 minutes  Recommendations for Outpatient Follow-up:  PCP in 1 week, please repeat CBC and CMP Cardiology in 1 month for left bundle branch block General surgeon for left inguinal hernia repair Please review all imaging studies from this admission   Discharge Diagnoses:    Sepsis ([place]) Partial small bowel obstruction E. 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 -In the ED he was found to be febrile, tachycardic with leukocytosis, lactic acidosis -CT abdomen pelvis noted focal dilated loop of small bowel and small to moderate volume left inguinal hernia containing a portion of the urinary bladder  Hospital Course:   Transient PSBO Sepsis E. coli and Klebsiella bacteremia -Clinically suspect transient PSBO on 11/8 with resultant translocation of bacteria, sepsis and bacteremia -Clinically improved, symptoms have resolved, afebrile, leukocytosis improving -Seen by general surgery in consultation, improved spontaneously, did not require surgical management -Sensitivities on E. 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 -Continue pancreatic enzymes   Mild AKI -Secondary to sepsis, ARB was held on admission -IV Lasix x1 today for iatrogenic edema, from fluid resuscitation for sepsis -Improved, creatinine down to 1.2/1.3 -Valsartan dose decreased at discharge   Abnormal LFTs -Likely secondary to sepsis, prior cholecystectomy -No biliary dilation noted on CT, or [place] -improving, continue to trend -GI consulted per pt request, appreciate gastroenterology evaluation by Dr.Cirigliano   LBBB -Chronicity unknown, patient denies any symptoms, troponin negative on admission -Will need cardiology follow-up   Chronic hyponatremia   Consultants:  General surgery [facility] Dr. Gerrit Mercer   Discharge Exam: Vitals:   03/25/21 0603 03/25/21 0700  BP: (!) 146/59 (!) 144/67  Pulse: 66 61  Resp:  17  Temp: 99 F (37.2 C) 99 F (37.2 C)  SpO2: 94% 96%  General exam: sitting up in bed smiling, AAOx3, no distress HEENT: No JVD CVS: S1-S2, regular rate rhythm Lungs: Decreased breath sounds at bases Abdomen: Soft, nontender, normal surgical scar abdominal wall defect, nontender, bowel sounds present Extremities: No edema  Skin: No rash on exposed skin     Discharge Instructions   Discharge Instructions     Ambulatory referral to Cardiology  Complete by: As directed    Diet - low sodium heart healthy   Complete by: As directed    Diet Carb Modified   Complete by: As directed     Increase activity slowly   Complete by: As directed       Allergies as of 03/25/2021       Reactions   Azilsartan Other (See Comments)   Possibly caused hives (as Edarbyclor) on 06/16/14   Chlorthalidone Other (See Comments)   Possibly caused hives (as Edarbyclor) on 06/16/14   Shellfish Allergy    Amlodipine Hives, Itching, Rash   Lisinopril Hives, Itching, Rash        Medication List     STOP taking these medications    HYDROcodone-acetaminophen 5-325 MG tablet Commonly known as: NORCO/VICODIN       TAKE these medications    albuterol 108 (90 Base) MCG/ACT inhaler Commonly known as: VENTOLIN HFA Inhale 2 puffs into the lungs every 6 (six) hours as needed for wheezing or shortness of breath.   aspirin 81 MG tablet Take 81 mg by mouth daily.   Bystolic 10 MG tablet Generic drug: nebivolol Take 10 mg by mouth daily.   cefdinir 300 MG capsule Commonly known as: OMNICEF Take 1 capsule (300 mg total) by mouth 2 (two) times daily for 5 days.   Creon 24000-76000 units Cpep Generic drug: Pancrelipase (Lip-Prot-Amyl) Take 5-6 capsules by mouth 3 (three) times daily with meals. Take 3 capsules with snacks   cyanocobalamin 1000 MCG/ML injection Commonly known as: (VITAMIN B-12) Inject 1,000 mcg into the muscle every 30 (thirty) days.   esomeprazole 20 MG capsule Commonly known as: NEXIUM Take 20 mg by mouth daily at 12 noon.   glucosamine-chondroitin 500-400 MG tablet Take 1 tablet by mouth daily.   NovoLOG 100 UNIT/ML injection Generic drug: insulin aspart Mini med insulin pump   valsartan 80 MG tablet Commonly known as: DIOVAN Take 1 tablet (80 mg total) by mouth daily. What changed:  medication strength how much to take   Vitamin D (Ergocalciferol) 1.25 MG (50000 UNIT) Caps capsule Commonly known as: DRISDOL Take 50,000 Units by mouth every Monday.       Allergies  Allergen Reactions   Azilsartan Other (See Comments)    Possibly caused hives (as  Edarbyclor) on 06/16/14   Chlorthalidone Other (See Comments)    Possibly caused hives (as Edarbyclor) on 06/16/14   Shellfish Allergy    Amlodipine Hives, Itching and Rash   Lisinopril Hives, Itching and Rash    Follow-up Information     Surgery, [place]. Schedule an appointment as soon as possible for a visit.   Specialty: General Surgery Why: To discuss elective laparoscopic repair of inguinal hernia. Dr. Rosendo Gros, Dr. Redmond Pulling, Dr. Kieth Brightly and Dr. Thermon Leyland would all be appropriate for you to see. Contact information: [address] [place] [address] [phone]         Tisovec, Fransico Him, MD. Schedule an appointment as soon as possible for a visit in 1 week(s).   Specialty: Internal Medicine Why: check labs-CBC/CMP at follow up Contact information: [place] [address] [phone]         Cardiology Follow up in 3 week(s).   Why: Referral sent for LBBB                 The results of significant diagnostics from this hospitalization (including imaging, microbiology, ancillary and laboratory) are listed below for reference.  Significant Diagnostic Studies: CT ABDOMEN PELVIS W CONTRAST  Result Date: 03/21/2021 CLINICAL DATA:  LUQ abdominal pain History of previous Whipple surgery, left upper quadrant abdominal pain and tenderness with swelling and some altered mental status. Concern for internal herniation versus obstruction versus other. EXAM: CT ABDOMEN AND PELVIS WITH CONTRAST TECHNIQUE: Multidetector CT imaging of the abdomen and pelvis was performed using the standard protocol following bolus administration of intravenous contrast. CONTRAST:  158mL OMNIPAQUE IOHEXOL 300 MG/ML  SOLN COMPARISON:  PET CT 07/21/2012 FINDINGS: Lower chest: Bibasilar atelectasis. Hepatobiliary: No focal liver abnormality. Status post cholecystectomy. No biliary dilatation. Pneumobilia. Pancreas: Status post Whipple procedure. Remaining  pancreatic parenchyma is markedly diffusely atrophic. No focal lesion. Otherwise normal pancreatic contour. No surrounding inflammatory changes. No main pancreatic ductal dilatation. Spleen: Normal in size without focal abnormality. Adrenals/Urinary Tract: No adrenal nodule bilaterally. Bilateral kidneys enhance symmetrically. No hydronephrosis. No hydroureter. The urinary bladder is unremarkable. On delayed imaging, there is no urothelial wall thickening and there are no filling defects in the opacified portions of the bilateral collecting systems or ureters. Stomach/Bowel: Status post Whipple procedure. Stomach is within normal limits. Focal short loop of small bowel within the right upper quadrant dilated with fecalized material measuring up to 5.5 cm adjacent to anastomosis. No evidence of large bowel wall thickening or dilatation. Appendix appears normal. Vascular/Lymphatic: No abdominal aorta or iliac aneurysm. Moderate to severe atherosclerotic plaque of the aorta and its branches. No abdominal, pelvic, or inguinal lymphadenopathy. Reproductive: Prostate is unremarkable. Other: No intraperitoneal free fluid. No intraperitoneal free gas. No organized fluid collection. Musculoskeletal: Small to moderate volume left inguinal hernia containing a portion of the urinary bladder. IMPRESSION: 1. Small to moderate volume left inguinal hernia containing a portion of the urinary bladder. Correlate with physical exam for incarceration. 2. Focal short loop of small bowel within the right upper quadrant dilated with fecalized material. Finding may represent a developing obstruction versus may be normal variant in the setting of postsurgical changes. 3.  Aortic Atherosclerosis (ICD10-I70.0). Electronically Signed   By: Iven Finn M.D.   On: 03/21/2021 16:34   DG Chest Portable 1 View  Result Date: 03/21/2021 CLINICAL DATA:  Infection evaluation. EXAM: PORTABLE CHEST 1 VIEW COMPARISON:  Chest CT 07/21/2012 FINDINGS:  The cardiac silhouette is normal in size. There are mild interstitial densities bilaterally most notable in the peripheral left mid and lower lung. No consolidative airspace opacity, pleural effusion, or pneumothorax is identified. No acute osseous abnormality is seen. IMPRESSION: Mild interstitial densities in the left greater than right lungs which could reflect atypical/viral infection or early edema. Electronically Signed   By: Logan Bores M.D.   On: 03/21/2021 19:15   US Abdomen Limited RUQ (LIVER/GB)  Result Date: 03/22/2021 CLINICAL DATA:  Elevated liver enzymes. Status post Whipple procedure. EXAM: ULTRASOUND ABDOMEN LIMITED RIGHT UPPER QUADRANT COMPARISON:  03/21/2021. FINDINGS: Gallbladder: Surgically absent. Common bile duct: Diameter: 2.7 mm Liver: No focal lesion identified. Within normal limits in parenchymal echogenicity. Portal vein is patent on color Doppler imaging with normal direction of blood flow towards the liver. Other: No free fluid. IMPRESSION: 1. No acute abnormality. 2. Status post cholecystectomy. Electronically Signed   By: Brett Fairy M.D.   On: 03/22/2021 04:17    Microbiology: Recent Results (from the past 240 hour(s))  Blood culture (routine x 2)     Status: Abnormal   Collection Time: 03/21/21  2:44 PM   Specimen: BLOOD  Result Value Ref Range Status   Specimen  Description BLOOD LEFT ANTECUBITAL  Final   Special Requests   Final    BOTTLES DRAWN AEROBIC AND ANAEROBIC Blood Culture adequate volume   Culture  Setup Time   Final    GRAM NEGATIVE RODS IN BOTH AEROBIC AND ANAEROBIC BOTTLES CRITICAL RESULT CALLED TO, READ BACK BY AND VERIFIED WITH: PHARMD JAMES LEDFORD 03/22/21@4 :51 BY TW Performed at [facility], [place] [address], [place], [place] [address]    Culture ESCHERICHIA COLI KLEBSIELLA OXYTOCA  (A)  Final   Report Status 03/25/2021 FINAL  Final   Organism ID, Bacteria ESCHERICHIA COLI  Final   Organism ID, Bacteria KLEBSIELLA OXYTOCA  Final       Susceptibility   Escherichia coli - MIC*    AMPICILLIN >=32 RESISTANT Resistant     CEFAZOLIN <=4 SENSITIVE Sensitive     CEFEPIME <=0.12 SENSITIVE Sensitive     CEFTAZIDIME <=1 SENSITIVE Sensitive     CEFTRIAXONE <=0.25 SENSITIVE Sensitive     CIPROFLOXACIN <=0.25 SENSITIVE Sensitive     GENTAMICIN <=1 SENSITIVE Sensitive     IMIPENEM <=0.25 SENSITIVE Sensitive     TRIMETH/SULFA >=320 RESISTANT Resistant     AMPICILLIN/SULBACTAM 8 SENSITIVE Sensitive     PIP/TAZO <=4 SENSITIVE Sensitive     * ESCHERICHIA COLI   Klebsiella oxytoca - MIC*    AMPICILLIN RESISTANT Resistant     CEFAZOLIN 8 SENSITIVE Sensitive     CEFEPIME <=0.12 SENSITIVE Sensitive     CEFTAZIDIME <=1 SENSITIVE Sensitive     CEFTRIAXONE <=0.25 SENSITIVE Sensitive     CIPROFLOXACIN <=0.25 SENSITIVE Sensitive     GENTAMICIN <=1 SENSITIVE Sensitive     IMIPENEM <=0.25 SENSITIVE Sensitive     TRIMETH/SULFA <=20 SENSITIVE Sensitive     AMPICILLIN/SULBACTAM 8 SENSITIVE Sensitive     PIP/TAZO <=4 SENSITIVE Sensitive     * KLEBSIELLA OXYTOCA  Blood Culture ID Panel (Reflexed)     Status: Abnormal   Collection Time: 03/21/21  2:44 PM  Result Value Ref Range Status   Enterococcus faecalis NOT DETECTED NOT DETECTED Final   Enterococcus Faecium NOT DETECTED NOT DETECTED Final   Listeria monocytogenes NOT DETECTED NOT DETECTED Final   Staphylococcus species NOT DETECTED NOT DETECTED Final   Staphylococcus aureus (BCID) NOT DETECTED NOT DETECTED Final   Staphylococcus epidermidis NOT DETECTED NOT DETECTED Final   Staphylococcus lugdunensis NOT DETECTED NOT DETECTED Final   Streptococcus species NOT DETECTED NOT DETECTED Final   Streptococcus agalactiae NOT DETECTED NOT DETECTED Final   Streptococcus pneumoniae NOT DETECTED NOT DETECTED Final   Streptococcus pyogenes NOT DETECTED NOT DETECTED Final   A.calcoaceticus-baumannii NOT DETECTED NOT DETECTED Final   Bacteroides fragilis NOT DETECTED NOT DETECTED Final    Enterobacterales DETECTED (A) NOT DETECTED Final    Comment: CRITICAL RESULT CALLED TO, READ BACK BY AND VERIFIED WITH: PHARMD JAMES LEDFORD 03/22/21@4 :51 BY TW    Enterobacter cloacae complex NOT DETECTED NOT DETECTED Final   Escherichia coli DETECTED (A) NOT DETECTED Final    Comment: CRITICAL RESULT CALLED TO, READ BACK BY AND VERIFIED WITH: PHARMD JAMES LEDFORD 03/22/21@4 :51 BY TW    Klebsiella aerogenes NOT DETECTED NOT DETECTED Final   Klebsiella oxytoca DETECTED (A) NOT DETECTED Final    Comment: CRITICAL RESULT CALLED TO, READ BACK BY AND VERIFIED WITH: PHARMD JAMES LEDFORD 03/22/21@4 :51 BY TW    Klebsiella pneumoniae NOT DETECTED NOT DETECTED Final   Proteus species NOT DETECTED NOT DETECTED Final   Salmonella species NOT DETECTED NOT DETECTED Final  Serratia marcescens NOT DETECTED NOT DETECTED Final   Haemophilus influenzae NOT DETECTED NOT DETECTED Final   Neisseria meningitidis NOT DETECTED NOT DETECTED Final   Pseudomonas aeruginosa NOT DETECTED NOT DETECTED Final   Stenotrophomonas maltophilia NOT DETECTED NOT DETECTED Final   Candida albicans NOT DETECTED NOT DETECTED Final   Candida auris NOT DETECTED NOT DETECTED Final   Candida glabrata NOT DETECTED NOT DETECTED Final   Candida krusei NOT DETECTED NOT DETECTED Final   Candida parapsilosis NOT DETECTED NOT DETECTED Final   Candida tropicalis NOT DETECTED NOT DETECTED Final   Cryptococcus neoformans/gattii NOT DETECTED NOT DETECTED Final   CTX-M ESBL NOT DETECTED NOT DETECTED Final   Carbapenem resistance IMP NOT DETECTED NOT DETECTED Final   Carbapenem resistance KPC NOT DETECTED NOT DETECTED Final   Carbapenem resistance NDM NOT DETECTED NOT DETECTED Final   Carbapenem resist OXA 48 LIKE NOT DETECTED NOT DETECTED Final   Carbapenem resistance VIM NOT DETECTED NOT DETECTED Final    Comment: Performed at [facility], [address] [place], [place] [address]  Resp Panel by RT-PCR (Flu A&B, Covid)  Nasopharyngeal Swab     Status: None   Collection Time: 03/21/21  3:23 PM   Specimen: Nasopharyngeal Swab; Nasopharyngeal(NP) swabs in vial transport medium  Result Value Ref Range Status   SARS Coronavirus 2 by RT PCR NEGATIVE NEGATIVE Final    Comment: (NOTE) SARS-CoV-2 target nucleic acids are NOT DETECTED.  The SARS-CoV-2 RNA is generally detectable in upper respiratory specimens during the acute phase of infection. The lowest concentration of SARS-CoV-2 viral copies this assay can detect is 138 copies/mL. A negative result does not preclude SARS-Cov-2 infection and should not be used as the sole basis for treatment or other patient management decisions. A negative result may occur with  improper specimen collection/handling, submission of specimen other than nasopharyngeal swab, presence of viral mutation(s) within the areas targeted by this assay, and inadequate number of viral copies(<138 copies/mL). A negative result must be combined with clinical observations, patient history, and epidemiological information. The expected result is Negative.  Fact Sheet for Patients:  EntrepreneurPulse.com.au  Fact Sheet for Healthcare Providers:  IncredibleEmployment.be  This test is no t yet approved or cleared by the [place] FDA and  has been authorized for detection and/or diagnosis of SARS-CoV-2 by FDA under an Emergency Use Authorization (EUA). This EUA will remain  in effect (meaning this test can be used) for the duration of the COVID-19 declaration under Section 564(b)(1) of the Act, 21 U.S.C.section 360bbb-3(b)(1), unless the authorization is terminated  or revoked sooner.       Influenza A by PCR NEGATIVE NEGATIVE Final   Influenza B by PCR NEGATIVE NEGATIVE Final    Comment: (NOTE) The Xpert Xpress SARS-CoV-2/FLU/RSV plus assay is intended as an aid in the diagnosis of influenza from Nasopharyngeal swab specimens and should not be  used as a sole basis for treatment. Nasal washings and aspirates are unacceptable for Xpert Xpress SARS-CoV-2/FLU/RSV testing.  Fact Sheet for Patients: EntrepreneurPulse.com.au  Fact Sheet for Healthcare Providers: IncredibleEmployment.be  This test is not yet approved or cleared by the [place] FDA and has been authorized for detection and/or diagnosis of SARS-CoV-2 by FDA under an Emergency Use Authorization (EUA). This EUA will remain in effect (meaning this test can be used) for the duration of the COVID-19 declaration under Section 564(b)(1) of the Act, 21 U.S.C. section 360bbb-3(b)(1), unless the authorization is terminated or revoked.  Performed at [facility], [address] [place], [place] [address]   Urine Culture     Status: None   Collection Time: 03/21/21  4:58 PM   Specimen: Urine, Clean Catch  Result Value Ref Range Status   Specimen Description   Final    URINE, CLEAN CATCH Performed at [facility], [address] [place], [place] [address]    Special Requests   Final    NONE Performed at [facility], [address], [place], [place] [address]    Culture   Final    NO GROWTH Performed at [facility], [place] [address] [place], [place] [address]    Report Status 03/23/2021 FINAL  Final  Gastrointestinal Panel by PCR , Stool     Status: None   Collection Time: 03/22/21 11:07 AM   Specimen: Stool  Result Value Ref Range Status   Campylobacter species NOT DETECTED NOT DETECTED Final   Plesimonas shigelloides NOT DETECTED NOT DETECTED Final   Salmonella species NOT DETECTED NOT DETECTED Final   Yersinia enterocolitica NOT DETECTED NOT DETECTED Final   Vibrio species NOT DETECTED NOT DETECTED Final   Vibrio cholerae NOT DETECTED NOT DETECTED Final   Enteroaggregative E coli (EAEC) NOT DETECTED NOT DETECTED Final   Enteropathogenic E coli (EPEC) NOT  DETECTED NOT DETECTED Final   Enterotoxigenic E coli (ETEC) NOT DETECTED NOT DETECTED Final   Shiga like toxin producing E coli (STEC) NOT DETECTED NOT DETECTED Final   Shigella/Enteroinvasive E coli (EIEC) NOT DETECTED NOT DETECTED Final   Cryptosporidium NOT DETECTED NOT DETECTED Final   Cyclospora cayetanensis NOT DETECTED NOT DETECTED Final   Entamoeba histolytica NOT DETECTED NOT DETECTED Final   Giardia lamblia NOT DETECTED NOT DETECTED Final   Adenovirus F40/41 NOT DETECTED NOT DETECTED Final   Astrovirus NOT DETECTED NOT DETECTED Final   Norovirus GI/GII NOT DETECTED NOT DETECTED Final   Rotavirus A NOT DETECTED NOT DETECTED Final   Sapovirus (I, II, IV, and V) NOT DETECTED NOT DETECTED Final    Comment: Performed at [facility], [address] [place], [place] [address]     Labs: Basic Metabolic Panel: Recent Labs  Lab 03/21/21 1425 03/22/21 0555 03/23/21 0249 03/24/21 0155 03/25/21 0205  NA 128* 129* 128* 127* 129*  K 4.8 3.9 3.7 3.5 3.7  CL 94* 99 99 97* 98  CO2 21* 23 20* 22 23  GLUCOSE 190* 137* 133* 66* 66*  BUN 15 14 16 16 16   CREATININE 1.31* 1.42* 1.25* 1.26* 1.30*  CALCIUM 9.3 8.0* 7.7* 7.8* 8.0*   Liver Function Tests: Recent Labs  Lab 03/21/21 1425 03/22/21 0555 03/23/21 0249 03/24/21 0155 03/25/21 0205  AST 623* 271* 131* 66* 44*  ALT 546* 346* 225* 158* 113*  ALKPHOS 365* 203* 184* 194* 195*  BILITOT 3.1* 4.0* 2.8* 1.8* 1.1  PROT 6.7 5.5* 5.4* 5.6* 5.9*  ALBUMIN 3.9 2.6* 2.5* 2.5* 2.3*   Recent Labs  Lab 03/21/21 1425  LIPASE <10*   No results for input(s): AMMONIA in the last 168 hours. CBC: Recent Labs  Lab 03/21/21 1425 03/22/21 0555 03/23/21 0249 03/24/21 0155 03/25/21 0205  WBC 18.1* 15.5* 13.3* 13.6* 12.4*  HGB 14.1 12.7* 12.1* 12.3* 13.4  HCT 41.3 36.0* 35.3* 36.3* 39.6  MCV 83.3 83.1 84.0 83.8 84.1  PLT 228 165 151 173 185   Cardiac Enzymes: No results for input(s): CKTOTAL, CKMB, CKMBINDEX, TROPONINI in  the last 168 hours. BNP: BNP (last 3 results) No  results for input(s): BNP in the last 8760 hours.  ProBNP (last 3 results) No results for input(s): PROBNP in the last 8760 hours.  CBG: Recent Labs  Lab 03/24/21 0744 03/24/21 1135 03/24/21 1601 03/24/21 2205 03/25/21 0334  GLUCAP 133* 202* 176* 142* 72       Signed:  Domenic Polite MD.  [facility] 03/26/2021, 11:32 AM

## 2021-03-28 ENCOUNTER — Telehealth: Payer: Self-pay | Admitting: Gastroenterology

## 2021-03-28 NOTE — Telephone Encounter (Signed)
Hi Dr. Bryan Lemma,  Moran saw this patient at [facility] and asked him to call and make a follow up appointment with you in three months.  He has established care with Dr. Allyn Kenner at [facility], but he says he is retiring and wants to switch care to you.  His records are in [place]. Is it OK to go ahead and schedule this follow-up appointment with him?  Please advise.  Thank you.

## 2021-03-31 DIAGNOSIS — E1065 Type 1 diabetes mellitus with hyperglycemia: Secondary | ICD-10-CM | POA: Diagnosis not present

## 2021-03-31 DIAGNOSIS — K56600 Partial intestinal obstruction, unspecified as to cause: Secondary | ICD-10-CM | POA: Diagnosis not present

## 2021-03-31 DIAGNOSIS — Z9641 Presence of insulin pump (external) (internal): Secondary | ICD-10-CM | POA: Diagnosis not present

## 2021-03-31 DIAGNOSIS — E109 Type 1 diabetes mellitus without complications: Secondary | ICD-10-CM | POA: Diagnosis not present

## 2021-03-31 DIAGNOSIS — A4159 Other Gram-negative sepsis: Secondary | ICD-10-CM | POA: Diagnosis not present

## 2021-04-04 ENCOUNTER — Encounter: Payer: Self-pay | Admitting: Gastroenterology

## 2021-04-13 DIAGNOSIS — R7989 Other specified abnormal findings of blood chemistry: Secondary | ICD-10-CM | POA: Diagnosis not present

## 2021-04-15 DIAGNOSIS — E1065 Type 1 diabetes mellitus with hyperglycemia: Secondary | ICD-10-CM | POA: Diagnosis not present

## 2021-04-15 DIAGNOSIS — Z9641 Presence of insulin pump (external) (internal): Secondary | ICD-10-CM | POA: Diagnosis not present

## 2021-04-15 DIAGNOSIS — E109 Type 1 diabetes mellitus without complications: Secondary | ICD-10-CM | POA: Diagnosis not present

## 2021-04-20 DIAGNOSIS — Z8507 Personal history of malignant neoplasm of pancreas: Secondary | ICD-10-CM | POA: Diagnosis not present

## 2021-04-20 DIAGNOSIS — R7989 Other specified abnormal findings of blood chemistry: Secondary | ICD-10-CM | POA: Diagnosis not present

## 2021-04-26 DIAGNOSIS — R7989 Other specified abnormal findings of blood chemistry: Secondary | ICD-10-CM | POA: Diagnosis not present

## 2021-04-27 ENCOUNTER — Encounter: Payer: Self-pay | Admitting: Internal Medicine

## 2021-04-27 ENCOUNTER — Other Ambulatory Visit: Payer: Self-pay

## 2021-04-27 ENCOUNTER — Ambulatory Visit (INDEPENDENT_AMBULATORY_CARE_PROVIDER_SITE_OTHER): Payer: BC Managed Care – PPO | Admitting: Internal Medicine

## 2021-04-27 VITALS — BP 156/68 | HR 56 | Ht 69.0 in | Wt 215.0 lb

## 2021-04-27 DIAGNOSIS — I447 Left bundle-branch block, unspecified: Secondary | ICD-10-CM

## 2021-04-27 NOTE — Patient Instructions (Signed)
Medication Instructions:  No Changes In Medications at this time.  *If you need a refill on your cardiac medications before your next appointment, please call your pharmacy*  Testing/Procedures: Your physician has requested that you have a lexiscan myoview. For further information please visit [email or web address]. Please follow instruction sheet, as given. This will take place at [address]  Follow-Up: At [facility], you and your health needs are our priority.  As part of our continuing mission to provide you with exceptional heart care, we have created designated Provider Care Teams.  These Care Teams include your primary Cardiologist (physician) and Advanced Practice Providers (APPs -  Physician Assistants and Nurse Practitioners) who all work together to provide you with the care you need, when you need it.  Your next appointment:   6 month(s)  The format for your next appointment:   In Person  Provider:   Janina Mayo, MD

## 2021-04-27 NOTE — Progress Notes (Signed)
Cardiology Office Note:    Date:  04/27/2021   ID:  Robert Mercer, DOB January 27, 1961, MRN [id number]  PCP:  Haywood Pao, MD   [place] Providers Cardiologist:  Janina Mayo, MD     Referring MD: Domenic Polite, MD   Chief Complaint  Patient presents with   New Patient (Initial Visit)  LBBB  History of Present Illness:    Robert Mercer is a 60 y.o. male with a hx of pancreatic B-cell lymphoma, history of Whipple surgery in 2003 (CHOP-R x 3 in 2003), history of ventral hernia repair in 2018, type 1 diabetes mellitus on insulin pump,  cholecystectomy, OSA referral for LBBB  Patient presented to [facility] with left-sided abdominal pain, distention and weakness on 03/21/2021. He was tachycardic, febrile and had leukocytosis as well as lactic acidosis. He had CT abdomen pelvis noted focal dilated loop of small bowel and small to moderate volume left inguinal hernia containing a portion of the urinary bladder. He had e coli/klebsiella bacteremia /sepsis.  There was concern for partial SBO. He was managed with antibiotics. No surgical indication.  He had an EKG on arrival which showed LBBB, prior EKG 08/24/2016 which did not show LBBB. Troponin was negative.   No hx of cardiotoxicity with doxorubicin therapy in 2003. He had radiation. No cancer recurrence.  He is followed by GI.   Today he reports, he feels better. He denies chest pain, no SOB. His energy has improved since the hospitalization. He had LUQ discomfort. No hx of stress test or LHC. No smoking hx. Father- 17 dementia. Mother- healthy. Grandfather on mother's side had stroke.  No orthopnea, PND. He gets occasional ankle swelling.  It improves after sleeping. He is planning for possible inguinal hernia repair.    LDL 99 Na 125  Alk phos 795 AST 151 ALT 318  Past Medical History:  Diagnosis Date   Arthritis    "left ankle; I broke it in high school" (09/03/2016)   Childhood asthma    Diabetes mellitus without  complication (HCC)    Novolog insulin pump.Average fasting blood sugar runs 145   Diabetic retinopathy associated with type 1 diabetes mellitus ([place])    Diffuse large B cell lymphoma (HCC)    Dyslipidemia    GERD (gastroesophageal reflux disease)    takes Omeprazole daily   History of blood transfusion 2003   related to Whipple; developed itching/blotching X 1; transfused later also related to the Whipple; no problems" (09/03/2016)   Hypertension    takes Bystolic and Diovan daily   Joint pain    Nocturia    Pancreatic lymphoma ([place]) 02/14/2002   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 Valere Dross   Sleep apnea    mild to moderate; "couldn't tolerate CPAP; I wear an appliance" (09/03/2016)   Vitamin B12 deficiency    takes B12 every 30 days.   Vitamin D deficiency    takes Vitamin D daily    Past Surgical History:  Procedure Laterality Date   CHOLECYSTECTOMY  2003   COLONOSCOPY  05/2007   ESOPHAGOGASTRODUODENOSCOPY     EUS N/A 07/23/2012   Procedure: FULL UPPER ENDOSCOPIC ULTRASOUND (EUS) RADIAL;  Surgeon: Arta Silence, MD;  Location: [facility];  Service: Endoscopy;  Laterality: N/A;   HERNIA REPAIR     INSERTION OF MESH N/A 09/03/2016   Procedure: INSERTION OF MESH;  Surgeon: Armandina Gemma, MD;  Location: [facility];  Service: General;  Laterality: N/A;   LAPAROSCOPIC INCISIONAL / UMBILICAL / [place]     w/mesh/notes 09/03/2016   percuteaneous puncture  X 3   to place stent in one of my ducts related to scar tissue from [place] N/A 09/03/2016   Procedure: [place];  Surgeon: Armandina Gemma, MD;  Location: [facility];  Service: General;  Laterality: N/A;   WHIPPLE PROCEDURE  2003   pancreatic lymphoma   WISDOM TOOTH EXTRACTION      Current Medications: Current Meds  Medication Sig   albuterol (VENTOLIN HFA) 108  (90 Base) MCG/ACT inhaler Inhale 2 puffs into the lungs every 6 (six) hours as needed for wheezing or shortness of breath.   aspirin 81 MG tablet Take 81 mg by mouth daily.   BYSTOLIC 10 MG tablet Take 10 mg by mouth daily.   CREON 24000-76000 units CPEP Take 5-6 capsules by mouth 3 (three) times daily with meals. Take 3 capsules with snacks   cyanocobalamin (,VITAMIN B-12,) 1000 MCG/ML injection Inject 1,000 mcg into the muscle every 30 (thirty) days.    esomeprazole (NEXIUM) 20 MG capsule Take 20 mg by mouth daily at 12 noon.   glucosamine-chondroitin 500-400 MG tablet Take 1 tablet by mouth daily.    NOVOLOG 100 UNIT/ML injection Mini med insulin pump   valsartan (DIOVAN) 80 MG tablet Take 1 tablet (80 mg total) by mouth daily.   Vitamin D, Ergocalciferol, (DRISDOL) 50000 UNITS CAPS Take 50,000 Units by mouth every Monday.      Allergies:   Azilsartan, Chlorthalidone, Shellfish allergy, Amlodipine, and Lisinopril   Social History   Socioeconomic History   Marital status: Single    Spouse name: Not on file   Number of children: Not on file   Years of education: Not on file   Highest education level: Not on file  Occupational History   Not on file  Tobacco Use   Smoking status: Never   Smokeless tobacco: Never  Substance and Sexual Activity   Alcohol use: Yes    Alcohol/week: 6.0 standard drinks    Types: 6 Cans of beer per week   Drug use: No   Sexual activity: Not Currently  Other Topics Concern   Not on file  Social History Narrative   Not on file   Social Determinants of Health   Financial Resource Strain: Not on file  Food Insecurity: Not on file  Transportation Needs: Not on file  Physical Activity: Not on file  Stress: Not on file  Social Connections: Not on file     Family History: The patient's   Father- 25 dementia. Mother- healthy. Grandfather on mother's side had stroke.  ROS:   Please see the history of present illness.     All other systems  reviewed and are negative.  EKGs/Labs/Other Studies Reviewed:    The following studies were reviewed today:   EKG:  EKG is  ordered today.  The ekg ordered today demonstrates   NSR, LBBB  Recent Labs: 03/25/2021: ALT 113; BUN 16; Creatinine, Ser 1.30; Hemoglobin 13.4; Platelets 185; Potassium 3.7; Sodium 129  Recent Lipid Panel No results found for: CHOL, TRIG, HDL, CHOLHDL, VLDL, LDLCALC, LDLDIRECT   Risk Assessment/Calculations:    The 10-year ASCVD risk score (Arnett DK, et al., 2019) is: 26.2%   Values used to calculate the score:     Age: 23 years  Sex: Male     Is Non-Hispanic African American: No     Diabetic: Yes     Tobacco smoker: No     Systolic Blood Pressure: 294 mmHg     Is BP treated: Yes     HDL Cholesterol: 37 MG/DL     Total Cholesterol: 150 MG/DL   Physical Exam:    VS:   Vitals:   04/27/21 1509  BP: (!) 156/68  Pulse: (!) 56    Wt Readings from Last 3 Encounters:  04/27/21 215 lb (97.5 kg)  03/21/21 214 lb (97.1 kg)  09/03/16 209 lb 14.1 oz (95.2 kg)     GEN:  Well nourished, well developed in no acute distress HEENT: Normal NECK: No JVD; No carotid bruits LYMPHATICS: No lymphadenopathy CARDIAC: RRR, no murmurs, rubs, gallops RESPIRATORY:  Clear to auscultation without rales, wheezing or rhonchi  ABDOMEN: Soft, non-tender, non-distended MUSCULOSKELETAL:  No edema; No deformity  SKIN: Warm and dry NEUROLOGIC:  Alert and oriented x 3 PSYCHIATRIC:  Normal affect   ASSESSMENT:    #New LBBB: He has no known cardiac dx hx. He does not have anginal symptoms; however he has DM1 and he may not have typical cp. He has CAD risk; ASCVD 26%. Will plan for lexiscan to assess for ischemic dx. If no ischemic dx, he is cleared from a cardiac perspective for hernia repair.   #HLD: LDL in a good range.   #HTN:  on nebivolol 10 mg daily and valsartan 80 mg daily. Could use better control , if Bps remain high will consider changing nebivolol to  chlorthalidone  #CardioOnc: No hx of cardiotoxicity on doxorubicin. No hx of chest radiation.  PLAN:    In order of problems listed above:  NM SPECT  Follow up 6 months      Shared Decision Making/Informed Consent The risks [chest pain, shortness of breath, cardiac arrhythmias, dizziness, blood pressure fluctuations, myocardial infarction, stroke/transient ischemic attack, nausea, vomiting, allergic reaction, radiation exposure, metallic taste sensation and life-threatening complications (estimated to be 1 in 10,000)], benefits (risk stratification, diagnosing coronary artery disease, treatment guidance) and alternatives of a nuclear stress test were discussed in detail with Robert Mercer and he agrees to proceed.    Medication Adjustments/Labs and Tests Ordered: Current medicines are reviewed at length with the patient today.  Concerns regarding medicines are outlined above.  Orders Placed This Encounter  Procedures   MYOCARDIAL PERFUSION IMAGING   EKG 12-Lead   No orders of the defined types were placed in this encounter.   Patient Instructions  Medication Instructions:  No Changes In Medications at this time.  *If you need a refill on your cardiac medications before your next appointment, please call your pharmacy*  Testing/Procedures: Your physician has requested that you have a lexiscan myoview. For further information please visit [email or web address]. Please follow instruction sheet, as given. This will take place at [address]  Follow-Up: At [facility], you and your health needs are our priority.  As part of our continuing mission to provide you with exceptional heart care, we have created designated Provider Care Teams.  These Care Teams include your primary Cardiologist (physician) and Advanced Practice Providers (APPs -  Physician Assistants and Nurse Practitioners) who all work together to provide you with the care you need, when you need it.  Your  next appointment:   6 month(s)  The format for your next appointment:   In Person  Provider:   Phineas Inches  E, MD     Signed, Janina Mayo, MD  04/27/2021 4:05 PM    [facility]

## 2021-05-01 ENCOUNTER — Encounter: Payer: Self-pay | Admitting: Internal Medicine

## 2021-05-01 ENCOUNTER — Encounter: Payer: Self-pay | Admitting: *Deleted

## 2021-05-11 ENCOUNTER — Inpatient Hospital Stay (HOSPITAL_COMMUNITY): Admission: RE | Admit: 2021-05-11 | Payer: BC Managed Care – PPO | Source: Ambulatory Visit

## 2021-05-11 ENCOUNTER — Encounter (HOSPITAL_COMMUNITY): Payer: BC Managed Care – PPO

## 2021-05-11 DIAGNOSIS — Z9641 Presence of insulin pump (external) (internal): Secondary | ICD-10-CM | POA: Diagnosis not present

## 2021-05-11 DIAGNOSIS — E1065 Type 1 diabetes mellitus with hyperglycemia: Secondary | ICD-10-CM | POA: Diagnosis not present

## 2021-05-11 DIAGNOSIS — Z794 Long term (current) use of insulin: Secondary | ICD-10-CM | POA: Diagnosis not present

## 2021-05-11 DIAGNOSIS — E109 Type 1 diabetes mellitus without complications: Secondary | ICD-10-CM | POA: Diagnosis not present

## 2021-05-16 ENCOUNTER — Other Ambulatory Visit: Payer: Self-pay

## 2021-05-16 ENCOUNTER — Ambulatory Visit (INDEPENDENT_AMBULATORY_CARE_PROVIDER_SITE_OTHER): Payer: BC Managed Care – PPO | Admitting: Gastroenterology

## 2021-05-16 ENCOUNTER — Encounter: Payer: Self-pay | Admitting: Gastroenterology

## 2021-05-16 VITALS — BP 150/70 | HR 59 | Ht 69.0 in | Wt 215.0 lb

## 2021-05-16 DIAGNOSIS — R748 Abnormal levels of other serum enzymes: Secondary | ICD-10-CM

## 2021-05-16 DIAGNOSIS — E109 Type 1 diabetes mellitus without complications: Secondary | ICD-10-CM | POA: Diagnosis not present

## 2021-05-16 DIAGNOSIS — Z90410 Acquired total absence of pancreas: Secondary | ICD-10-CM

## 2021-05-16 DIAGNOSIS — R7401 Elevation of levels of liver transaminase levels: Secondary | ICD-10-CM

## 2021-05-16 DIAGNOSIS — Z1211 Encounter for screening for malignant neoplasm of colon: Secondary | ICD-10-CM

## 2021-05-16 DIAGNOSIS — Z9049 Acquired absence of other specified parts of digestive tract: Secondary | ICD-10-CM

## 2021-05-16 NOTE — Progress Notes (Addendum)
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  History of Pancreatic B Cell Lymphoma s/p Whipple in 2003 (along with chemotherapy and radiation), ventral hernia repair 2018, type 1 diabetes on insulin, OSA, HTN, HLD, GERD. In 2014 had transhepatic biliary stents placed by IR at [facility] for obstructing benign CBD stricture (biliary enteric anastomosis not accessible endoscopically).  Hospital admission 03/2021 for Klebsiella and E. coli bacteremia and transient pSBO  treated with vancomycin and Zosyn and bowel rest.  No surgery needed.  Inpatient GI team consulted for elevated liver enzymes which were attributed to cholestasis of sepsis and were downtrending at the time of discharge. - 03/21/2021: CT: e/o prior ccy, Whipple.  Pneumobilia but otherwise normal-appearing liver.  Atrophic remaining pancreas without PD dilation.  Focal dilation of the small bowel in the RUQ adjacent to the anastomosis.  Normal-appearing colon.  Small to moderate size left inguinal hernia containing portions of the bladder - RUQ [place] (11/9): ccy, normal-appearing liver with 2.7 mm CBD.  Normal Dopplers - Blood cultures: Klebsiella, E. Coli   - 03/21/2021:   AST/ALT/ALP 623/546/365, T bili 3.1 - 03/22/2021:   AST/ALT/ALP 271/346/203, T bili 4.0 - 03/23/2021: AST/ALT/ALP 131/235/184, T bili 2.8 - 03/25/2021: AST/ALT/ALP 44/113/195,   T bili 1.1 - 04/13/2021:   AST/ALT/ALP 151/318/795, T bili 1.0 - 04/26/2021: AST/ALT/ALP 64/91/404,     T bili 0.5  - 10/03/2018: MRCP: Minimal intrahepatic biliary ductal dilatation with some irregular beading appearance of the left hepatic duct and the common hepatic duct at the hilar confluence.  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  Postsurgical Whipple changes. -04/20/2021: MRCP: Stable mild to moderate intrahepatic biliary ductal dilatation without obstructing lesion.   Postsurgical Whipple changes.  Endoscopic History: - EUS (07/2012, Dr. Paulita Fujita): Small HH, patent Schatzki's ring, moderate LA grade B/C esophagitis.  Prior pancreaticoduodenectomy with healthy anastomosis.  EUS with grossly normal pancreatic remnant.  Intrahepatic biliary dilation. - Colonoscopy (07/2017, Dr. Earlean Shawl): Normal.  Recommended repeat in 5 years due to history of polyps  HPI:     Patient is a 61 y.o. male presenting to the Gastroenterology Clinic for hospital follow-up.    Due to jaundice and pruritus and uptrending liver enzymes on 12/1, MRCP performed on 04/20/2021 with stable CBD dilation without obvious stricture.  Was started on naltrexone with resolution of pruritus and jaundice.  Was last seen by Dr. Earlean Shawl on 04/26/2021 for continued follow-up of elevated liver enzymes.  No abdominal pain.  Plan at that time was for transition of GI care following Dr. Liliane Channel retirement.  He presents today to transfer care to [place].   He otherwise offers no new complaints today.  Feeling in his usual state of health.  He does have a long history of undulating liver enzymes over the years, generally asymptomatic during these events and enzymes improve.  He reports having colonoscopy every 5 years with Dr. Earlean Shawl.  Not sure when his last one was in no colonoscopy reports in EMR for review today.  Requesting those records.  Review of systems:     No chest pain, no SOB, no fevers, no urinary sx   Past Medical History:  Diagnosis Date   Arthritis    "left ankle; I broke it in high school" (09/03/2016)   Childhood asthma    Diabetes mellitus without complication (HCC)    Novolog insulin pump.Average fasting blood sugar  runs 145   Diabetic retinopathy associated with type 1 diabetes mellitus ([place])    Diffuse large B cell lymphoma ([place])    Dyslipidemia    GERD (gastroesophageal reflux disease)    takes Omeprazole daily   History of blood transfusion 2003   related to Whipple; developed  itching/blotching X 1; transfused later also related to the Whipple; no problems" (09/03/2016)   Hypertension    takes Bystolic and Diovan daily   Joint pain    Nocturia    Pancreatic lymphoma ([place]) 02/14/2002   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 Valere Dross   Sleep apnea    mild to moderate; "couldn't tolerate CPAP; I wear an appliance" (09/03/2016)   Vitamin B12 deficiency    takes B12 every 30 days.   Vitamin D deficiency    takes Vitamin D daily    Patient's surgical history, family medical history, social history, medications and allergies were all reviewed in Epic    Current Outpatient Medications  Medication Sig Dispense Refill   albuterol (VENTOLIN HFA) 108 (90 Base) MCG/ACT inhaler Inhale 2 puffs into the lungs every 6 (six) hours as needed for wheezing or shortness of breath. 8 g 1   aspirin 81 MG tablet Take 81 mg by mouth daily.     BYSTOLIC 10 MG tablet Take 10 mg by mouth daily.  1   CREON 24000-76000 units CPEP Take 5-6 capsules by mouth 3 (three) times daily with meals. Take 3 capsules with snacks  0   cyanocobalamin (,VITAMIN B-12,) 1000 MCG/ML injection Inject 1,000 mcg into the muscle every 30 (thirty) days.      glucosamine-chondroitin 500-400 MG tablet Take 1 tablet by mouth daily.      NOVOLOG 100 UNIT/ML injection Mini med insulin pump     pantoprazole (PROTONIX) 40 MG tablet Take 40 mg by mouth daily.     valsartan (DIOVAN) 80 MG tablet Take 1 tablet (80 mg total) by mouth daily. 30 tablet 0   Vitamin D, Ergocalciferol, (DRISDOL) 50000 UNITS CAPS Take 50,000 Units by mouth every Monday.      No current facility-administered medications for this visit.    Physical Exam:     BP (!) 150/70    Pulse (!) 59    Ht 5\' 9"  (1.753 m)    Wt 215 lb (97.5 kg)    SpO2 99%    BMI 31.75 kg/m   GENERAL:  Pleasant male in NAD PSYCH: : Cooperative, normal affect Musculoskeletal:  Normal muscle  tone, normal strength NEURO: Alert and oriented x 3, no focal neurologic deficits   IMPRESSION and PLAN:    1) Elevated liver enzymes-downtrending 2) Elevated alkaline phosphatase-downtrending 3) History of Pancreatic Lymphoma s/p Whipple 2003, chemotherapy, radiation - Repeat liver enzymes in 2 months - Continue Creon - MRCP completed last month and relatively unchanged from previous  4) History of diabetes - Insulin pump - Continued management per PCP  5) Colon cancer screening - Was previously having colonoscopy every 5 years for screening.  We will obtain records from previous GI and make a decision on appropriate timing of repeat colonoscopy  6) Inguinal hernia - Has appointment with Dr. Redmond Pulling to discuss surgery  I spent 30 minutes of time, including in depth chart review, independent review of results as outlined above, communicating results with the patient directly, face-to-face time with the patient, coordinating care, and ordering studies and medications as appropriate,  and documentation.  Addendum: Received colonoscopy report from 07/2017 on 05/17/2021.  Colonoscopy was normal with recommendation to repeat in 5 years due to prior history of polyps.  Plan for repeat colonoscopy in 05/2022 per previous Gastroenterologist recommendations.  Lavena Bullion ,DO, FACG 05/16/2021, 4:06 PM

## 2021-05-16 NOTE — Patient Instructions (Addendum)
If you are age 62 or older, your body mass index should be between 23-30. Your Body mass index is 31.75 kg/m. If this is out of the aforementioned range listed, please consider follow up with your Primary Care Provider.  If you are age 64 or younger, your body mass index should be between 19-25. Your Body mass index is 31.75 kg/m. If this is out of the aformentioned range listed, please consider follow up with your Primary Care Provider.   __________________________________________________________  The [facility] providers would like to encourage you to use [facility] to communicate with providers for non-urgent requests or questions.  Due to long hold times on the telephone, sending your provider a message by [facility] may be a faster and more efficient way to get a response.  Please allow 48 business hours for a response.  Please remember that this is for non-urgent requests.     Due to recent changes in healthcare laws, you may see the results of your imaging and laboratory studies on MyChart before your provider has had a chance to review them.  We understand that in some cases there may be results that are confusing or concerning to you. Not all laboratory results come back in the same time frame and the provider may be waiting for multiple results in order to interpret others.  Please give [place] 48 hours in order for your provider to thoroughly review all the results before contacting the office for clarification of your results.   Please have your lab work completed at March 2023.    Thank you for choosing me and [facility].  Vito Cirigliano, D.O.

## 2021-05-18 ENCOUNTER — Encounter (HOSPITAL_COMMUNITY): Payer: BC Managed Care – PPO

## 2021-05-18 ENCOUNTER — Telehealth (HOSPITAL_COMMUNITY): Payer: Self-pay | Admitting: *Deleted

## 2021-05-18 DIAGNOSIS — I10 Essential (primary) hypertension: Secondary | ICD-10-CM | POA: Diagnosis not present

## 2021-05-18 DIAGNOSIS — E559 Vitamin D deficiency, unspecified: Secondary | ICD-10-CM | POA: Diagnosis not present

## 2021-05-18 DIAGNOSIS — E109 Type 1 diabetes mellitus without complications: Secondary | ICD-10-CM | POA: Diagnosis not present

## 2021-05-18 DIAGNOSIS — E538 Deficiency of other specified B group vitamins: Secondary | ICD-10-CM | POA: Diagnosis not present

## 2021-05-18 DIAGNOSIS — E7800 Pure hypercholesterolemia, unspecified: Secondary | ICD-10-CM | POA: Diagnosis not present

## 2021-05-18 DIAGNOSIS — K4090 Unilateral inguinal hernia, without obstruction or gangrene, not specified as recurrent: Secondary | ICD-10-CM | POA: Diagnosis not present

## 2021-05-18 DIAGNOSIS — Z90410 Acquired total absence of pancreas: Secondary | ICD-10-CM | POA: Diagnosis not present

## 2021-05-18 NOTE — Telephone Encounter (Signed)
Patient given detailed instructions per Myocardial Perfusion Study Information Sheet for the test on 05/25/21 Patient notified to arrive 15 minutes early and that it is imperative to arrive on time for appointment to keep from having the test rescheduled.  If you need to cancel or reschedule your appointment, please call the office within 24 hours of your appointment. . Patient verbalized understanding.Kirstie Peri

## 2021-05-19 ENCOUNTER — Telehealth: Payer: Self-pay | Admitting: *Deleted

## 2021-05-19 NOTE — Telephone Encounter (Signed)
° °  Pre-operative Risk Assessment    Patient Name: Robert Mercer  DOB: Apr 09, 1961 MRN: [id number]      Request for Surgical Clearance    Procedure:   OPEN INGUINAL HERNIA REPAIR WITH MESH  Date of Surgery:  Clearance TBD                                 Surgeon:  DR ERIC WILSON Surgeon's Group or Practice Name:  [facility] Phone number:  [phone] Fax number:  [phone] [place] RN   Type of Clearance Requested:   - Medical    Type of Anesthesia:  General    Additional requests/questions:  Please advise surgeon/provider what medications should be held.  Olin Pia   05/19/2021, 5:05 PM

## 2021-05-22 NOTE — Telephone Encounter (Signed)
Patient was seen and evaluated by Dr. Harl Bowie 04/27/2021.  During that time she ordered stress testing.  Stress testing to be completed 05/25/2021.  We will await results from test before providing recommendations for cardiac clearance.  Jossie Ng. Blaire Hodsdon NP-C    05/22/2021, 8:26 AM [place] [place] [address] Office [phone] Fax [phone]

## 2021-05-25 ENCOUNTER — Ambulatory Visit (HOSPITAL_COMMUNITY): Payer: BC Managed Care – PPO | Attending: Internal Medicine

## 2021-05-25 ENCOUNTER — Other Ambulatory Visit: Payer: Self-pay

## 2021-05-25 DIAGNOSIS — I447 Left bundle-branch block, unspecified: Secondary | ICD-10-CM | POA: Diagnosis not present

## 2021-05-25 LAB — MYOCARDIAL PERFUSION IMAGING
LV dias vol: 132 mL (ref 62–150)
LV sys vol: 63 mL
Nuc Stress EF: 52 %
Peak HR: 81 {beats}/min
Rest HR: 57 {beats}/min
Rest Nuclear Isotope Dose: 10.9 mCi
SDS: 6
SRS: 1
SSS: 7
ST Depression (mm): 0 mm
Stress Nuclear Isotope Dose: 32.5 mCi
TID: 1.02

## 2021-05-25 MED ORDER — TECHNETIUM TC 99M TETROFOSMIN IV KIT
10.9000 | PACK | Freq: Once | INTRAVENOUS | Status: AC | PRN
  Administered 2021-05-25: 10.9 via INTRAVENOUS
  Filled 2021-05-25: qty 11

## 2021-05-25 MED ORDER — TECHNETIUM TC 99M TETROFOSMIN IV KIT
32.5000 | PACK | Freq: Once | INTRAVENOUS | Status: AC | PRN
  Administered 2021-05-25: 32.5 via INTRAVENOUS
  Filled 2021-05-25: qty 33

## 2021-05-25 MED ORDER — REGADENOSON 0.4 MG/5ML IV SOLN
0.4000 mg | Freq: Once | INTRAVENOUS | Status: AC
  Administered 2021-05-25: 0.4 mg via INTRAVENOUS

## 2021-05-25 NOTE — Telephone Encounter (Signed)
Myoview today

## 2021-05-25 NOTE — Telephone Encounter (Signed)
° ° °  Patient Name: Robert Mercer  DOB: 01-Feb-1961 MRN: [id number]  Primary Cardiologist: Janina Mayo, MD  Chart reviewed as part of pre-operative protocol coverage. Given past medical history and time since last visit, based on ACC/AHA guidelines, Robert Mercer would be at acceptable risk for the planned procedure without further cardiovascular testing. Myoview completed on 05/25/2021 was low risk.   I will route this recommendation to the requesting party via Epic fax function and remove from pre-op pool.  Please call with questions.  [place], [place] 05/25/2021, 4:20 PM

## 2021-05-26 DIAGNOSIS — E1065 Type 1 diabetes mellitus with hyperglycemia: Secondary | ICD-10-CM | POA: Diagnosis not present

## 2021-05-26 DIAGNOSIS — E10649 Type 1 diabetes mellitus with hypoglycemia without coma: Secondary | ICD-10-CM | POA: Diagnosis not present

## 2021-05-26 DIAGNOSIS — E103293 Type 1 diabetes mellitus with mild nonproliferative diabetic retinopathy without macular edema, bilateral: Secondary | ICD-10-CM | POA: Diagnosis not present

## 2021-05-26 DIAGNOSIS — E1022 Type 1 diabetes mellitus with diabetic chronic kidney disease: Secondary | ICD-10-CM | POA: Diagnosis not present

## 2021-05-26 DIAGNOSIS — Z9641 Presence of insulin pump (external) (internal): Secondary | ICD-10-CM | POA: Diagnosis not present

## 2021-05-31 DIAGNOSIS — R7401 Elevation of levels of liver transaminase levels: Secondary | ICD-10-CM | POA: Diagnosis not present

## 2021-06-08 ENCOUNTER — Other Ambulatory Visit: Payer: Self-pay | Admitting: General Surgery

## 2021-06-08 ENCOUNTER — Other Ambulatory Visit: Payer: Self-pay | Admitting: Gastroenterology

## 2021-06-08 ENCOUNTER — Encounter: Payer: Self-pay | Admitting: Gastroenterology

## 2021-06-08 MED ORDER — CREON 24000-76000 UNITS PO CPEP: 5.0000 | ORAL_CAPSULE | Freq: Three times a day (TID) | ORAL | 2 refills | Status: AC

## 2021-06-20 DIAGNOSIS — Z9641 Presence of insulin pump (external) (internal): Secondary | ICD-10-CM | POA: Diagnosis not present

## 2021-06-20 DIAGNOSIS — E1065 Type 1 diabetes mellitus with hyperglycemia: Secondary | ICD-10-CM | POA: Diagnosis not present

## 2021-06-20 DIAGNOSIS — E109 Type 1 diabetes mellitus without complications: Secondary | ICD-10-CM | POA: Diagnosis not present

## 2021-07-18 DIAGNOSIS — E109 Type 1 diabetes mellitus without complications: Secondary | ICD-10-CM | POA: Diagnosis not present

## 2021-07-18 DIAGNOSIS — E1065 Type 1 diabetes mellitus with hyperglycemia: Secondary | ICD-10-CM | POA: Diagnosis not present

## 2021-07-18 DIAGNOSIS — Z9641 Presence of insulin pump (external) (internal): Secondary | ICD-10-CM | POA: Diagnosis not present

## 2021-07-20 DIAGNOSIS — H5203 Hypermetropia, bilateral: Secondary | ICD-10-CM | POA: Diagnosis not present

## 2021-07-20 DIAGNOSIS — E119 Type 2 diabetes mellitus without complications: Secondary | ICD-10-CM | POA: Diagnosis not present

## 2021-08-17 DIAGNOSIS — I10 Essential (primary) hypertension: Secondary | ICD-10-CM | POA: Diagnosis not present

## 2021-08-17 DIAGNOSIS — Z1339 Encounter for screening examination for other mental health and behavioral disorders: Secondary | ICD-10-CM | POA: Diagnosis not present

## 2021-08-17 DIAGNOSIS — E109 Type 1 diabetes mellitus without complications: Secondary | ICD-10-CM | POA: Diagnosis not present

## 2021-08-17 DIAGNOSIS — Z1331 Encounter for screening for depression: Secondary | ICD-10-CM | POA: Diagnosis not present

## 2021-08-22 DIAGNOSIS — E103293 Type 1 diabetes mellitus with mild nonproliferative diabetic retinopathy without macular edema, bilateral: Secondary | ICD-10-CM | POA: Diagnosis not present

## 2021-08-22 DIAGNOSIS — Z8507 Personal history of malignant neoplasm of pancreas: Secondary | ICD-10-CM | POA: Diagnosis not present

## 2021-08-22 DIAGNOSIS — N182 Chronic kidney disease, stage 2 (mild): Secondary | ICD-10-CM | POA: Diagnosis not present

## 2021-08-22 DIAGNOSIS — E1069 Type 1 diabetes mellitus with other specified complication: Secondary | ICD-10-CM | POA: Diagnosis not present

## 2021-08-22 DIAGNOSIS — E10649 Type 1 diabetes mellitus with hypoglycemia without coma: Secondary | ICD-10-CM | POA: Diagnosis not present

## 2021-08-22 DIAGNOSIS — I129 Hypertensive chronic kidney disease with stage 1 through stage 4 chronic kidney disease, or unspecified chronic kidney disease: Secondary | ICD-10-CM | POA: Diagnosis not present

## 2021-08-22 DIAGNOSIS — E1065 Type 1 diabetes mellitus with hyperglycemia: Secondary | ICD-10-CM | POA: Diagnosis not present

## 2021-08-22 DIAGNOSIS — E1022 Type 1 diabetes mellitus with diabetic chronic kidney disease: Secondary | ICD-10-CM | POA: Diagnosis not present

## 2021-08-22 DIAGNOSIS — E785 Hyperlipidemia, unspecified: Secondary | ICD-10-CM | POA: Diagnosis not present

## 2021-08-22 DIAGNOSIS — R7989 Other specified abnormal findings of blood chemistry: Secondary | ICD-10-CM | POA: Diagnosis not present

## 2021-08-22 DIAGNOSIS — E559 Vitamin D deficiency, unspecified: Secondary | ICD-10-CM | POA: Diagnosis not present

## 2021-08-22 DIAGNOSIS — I10 Essential (primary) hypertension: Secondary | ICD-10-CM | POA: Diagnosis not present

## 2021-08-25 DIAGNOSIS — E1022 Type 1 diabetes mellitus with diabetic chronic kidney disease: Secondary | ICD-10-CM | POA: Diagnosis not present

## 2021-08-25 DIAGNOSIS — E1069 Type 1 diabetes mellitus with other specified complication: Secondary | ICD-10-CM | POA: Diagnosis not present

## 2021-08-25 DIAGNOSIS — N182 Chronic kidney disease, stage 2 (mild): Secondary | ICD-10-CM | POA: Diagnosis not present

## 2021-08-25 DIAGNOSIS — E10649 Type 1 diabetes mellitus with hypoglycemia without coma: Secondary | ICD-10-CM | POA: Diagnosis not present

## 2021-09-08 DIAGNOSIS — E1065 Type 1 diabetes mellitus with hyperglycemia: Secondary | ICD-10-CM | POA: Diagnosis not present

## 2021-09-08 DIAGNOSIS — E109 Type 1 diabetes mellitus without complications: Secondary | ICD-10-CM | POA: Diagnosis not present

## 2021-09-08 DIAGNOSIS — Z9641 Presence of insulin pump (external) (internal): Secondary | ICD-10-CM | POA: Diagnosis not present

## 2021-09-12 DIAGNOSIS — E109 Type 1 diabetes mellitus without complications: Secondary | ICD-10-CM | POA: Diagnosis not present

## 2021-09-12 DIAGNOSIS — Z9641 Presence of insulin pump (external) (internal): Secondary | ICD-10-CM | POA: Diagnosis not present

## 2021-09-12 DIAGNOSIS — E1065 Type 1 diabetes mellitus with hyperglycemia: Secondary | ICD-10-CM | POA: Diagnosis not present

## 2021-10-04 DIAGNOSIS — Z111 Encounter for screening for respiratory tuberculosis: Secondary | ICD-10-CM | POA: Diagnosis not present

## 2021-10-07 DIAGNOSIS — Z111 Encounter for screening for respiratory tuberculosis: Secondary | ICD-10-CM | POA: Diagnosis not present

## 2021-10-10 DIAGNOSIS — E1065 Type 1 diabetes mellitus with hyperglycemia: Secondary | ICD-10-CM | POA: Diagnosis not present

## 2021-10-10 DIAGNOSIS — E109 Type 1 diabetes mellitus without complications: Secondary | ICD-10-CM | POA: Diagnosis not present

## 2021-10-10 DIAGNOSIS — Z9641 Presence of insulin pump (external) (internal): Secondary | ICD-10-CM | POA: Diagnosis not present

## 2021-11-02 ENCOUNTER — Ambulatory Visit (INDEPENDENT_AMBULATORY_CARE_PROVIDER_SITE_OTHER): Payer: BC Managed Care – PPO | Admitting: Internal Medicine

## 2021-11-02 ENCOUNTER — Encounter: Payer: Self-pay | Admitting: Internal Medicine

## 2021-11-02 VITALS — BP 146/68 | HR 77 | Ht 69.0 in | Wt 220.8 lb

## 2021-11-02 DIAGNOSIS — I447 Left bundle-branch block, unspecified: Secondary | ICD-10-CM | POA: Diagnosis not present

## 2021-11-02 NOTE — Patient Instructions (Signed)
Medication Instructions:  The current medical regimen is effective;  continue present plan and medications as directed. Please refer to the Current Medication list given to you today.   *If you need a refill on your cardiac medications before your next appointment, please call your pharmacy*  Lab Work:   Testing/Procedures:  NONE    NONE  Follow-Up: Your next appointment:  AS NEEDED  with Janina Mayo, MD    At [facility], you and your health needs are our priority.  As part of our continuing mission to provide you with exceptional heart care, we have created designated Provider Care Teams.  These Care Teams include your primary Cardiologist (physician) and Advanced Practice Providers (APPs -  Physician Assistants and Nurse Practitioners) who all work together to provide you with the care you need, when you need it.  Important Information About [place]

## 2021-11-02 NOTE — Progress Notes (Signed)
Cardiology Office Note:    Date:  11/02/2021   ID:  Robert Mercer, DOB March 06, 1961, MRN [id number]  PCP:  Haywood Pao, MD   [facility] Providers Cardiologist:  Janina Mayo, MD     Referring MD: Haywood Pao, MD   No chief complaint on file. LBBB  History of Present Illness:    Robert Mercer is a 61 y.o. male with a hx of pancreatic B-cell lymphoma, history of Whipple surgery in 2003 (CHOP-R x 3 in 2003), history of ventral hernia repair in 2018, type 1 diabetes mellitus on insulin pump,  cholecystectomy, OSA referral for LBBB  Patient presented to [facility] with left-sided abdominal pain, distention and weakness on 03/21/2021. He was tachycardic, febrile and had leukocytosis as well as lactic acidosis. He had CT abdomen pelvis noted focal dilated loop of small bowel and small to moderate volume left inguinal hernia containing a portion of the urinary bladder. He had e coli/klebsiella bacteremia /sepsis.  There was concern for partial SBO. He was managed with antibiotics. No surgical indication.  He had an EKG on arrival which showed LBBB, prior EKG 08/24/2016 which did not show LBBB. Troponin was negative.   No hx of cardiotoxicity with doxorubicin therapy in 2003. He had radiation. No cancer recurrence.  He is followed by GI.   Today he reports, he feels better. He denies chest pain, no SOB. His energy has improved since the hospitalization. He had LUQ discomfort. No hx of stress test or LHC. No smoking hx. Father- 36 dementia. Mother- healthy. Grandfather on mother's side had stroke.  No orthopnea, PND. He gets occasional ankle swelling.  It improves after sleeping. He is planning for possible inguinal hernia repair.    LDL 99 Na 125  Alk phos 795 AST 151 ALT 318  Interim hx: 11/02/2021 Mr Robert Mercer feels well today. He denies angina, dyspnea on exertion, lower extremity edema, PND or orthopnea.  He plans to go to [place] soon to enjoy the outdoors.   Past Medical  History:  Diagnosis Date   Arthritis    "left ankle; I broke it in high school" (09/03/2016)   Childhood asthma    Diabetes mellitus without complication (HCC)    Novolog insulin pump.Average fasting blood sugar runs 145   Diabetic retinopathy associated with type 1 diabetes mellitus ([place])    Diffuse large B cell lymphoma (HCC)    Dyslipidemia    GERD (gastroesophageal reflux disease)    takes Omeprazole daily   History of blood transfusion 2003   related to Whipple; developed itching/blotching X 1; transfused later also related to the Whipple; no problems" (09/03/2016)   Hypertension    takes Bystolic and Diovan daily   Joint pain    Nocturia    Pancreatic lymphoma ([place]) 02/14/2002   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 Valere Dross   Sleep apnea    mild to moderate; "couldn't tolerate CPAP; I wear an appliance" (09/03/2016)   Vitamin B12 deficiency    takes B12 every 30 days.   Vitamin D deficiency    takes Vitamin D daily    Past Surgical History:  Procedure Laterality Date   CHOLECYSTECTOMY  2003   COLONOSCOPY  05/2007   ESOPHAGOGASTRODUODENOSCOPY     EUS N/A 07/23/2012   Procedure: FULL UPPER ENDOSCOPIC ULTRASOUND (EUS) RADIAL;  Surgeon: Arta Silence, MD;  Location: [facility];  Service: Endoscopy;  Laterality: N/A;  HERNIA REPAIR     INSERTION OF MESH N/A 09/03/2016   Procedure: INSERTION OF MESH;  Surgeon: Armandina Gemma, MD;  Location: [facility];  Service: General;  Laterality: N/A;   LAPAROSCOPIC INCISIONAL / UMBILICAL / [place]     w/mesh/notes 09/03/2016   percuteaneous puncture  X 3   to place stent in one of my ducts related to scar tissue from [place] N/A 09/03/2016   Procedure: [place];  Surgeon: Armandina Gemma, MD;  Location: [employer];  Service: General;  Laterality: N/A;   WHIPPLE PROCEDURE   2003   pancreatic lymphoma   WISDOM TOOTH EXTRACTION      Current Medications: Current Meds  Medication Sig   albuterol (VENTOLIN HFA) 108 (90 Base) MCG/ACT inhaler Inhale 2 puffs into the lungs every 6 (six) hours as needed for wheezing or shortness of breath.   aspirin 81 MG tablet Take 81 mg by mouth daily.   BYSTOLIC 10 MG tablet Take 10 mg by mouth daily.   cyanocobalamin (,VITAMIN B-12,) 1000 MCG/ML injection Inject 1,000 mcg into the muscle every 30 (thirty) days.    glucosamine-chondroitin 500-400 MG tablet Take 1 tablet by mouth daily.    lipase/protease/amylase (CREON) 36000 UNITS CPEP capsule Take by mouth.   NOVOLOG 100 UNIT/ML injection Mini med insulin pump   pantoprazole (PROTONIX) 40 MG tablet Take 40 mg by mouth daily.   valsartan (DIOVAN) 80 MG tablet Take 1 tablet (80 mg total) by mouth daily.   Vitamin D, Ergocalciferol, (DRISDOL) 50000 UNITS CAPS Take 50,000 Units by mouth every Monday.      Allergies:   Azilsartan, Chlorthalidone, Shellfish allergy, Amlodipine, and Lisinopril   Social History   Socioeconomic History   Marital status: Single    Spouse name: Not on file   Number of children: Not on file   Years of education: Not on file   Highest education level: Not on file  Occupational History   Not on file  Tobacco Use   Smoking status: Never   Smokeless tobacco: Never  Vaping Use   Vaping Use: Never used  Substance and Sexual Activity   Alcohol use: Yes    Alcohol/week: 6.0 standard drinks of alcohol    Types: 6 Cans of beer per week   Drug use: No   Sexual activity: Not Currently  Other Topics Concern   Not on file  Social History Narrative   Not on file   Social Determinants of Health   Financial Resource Strain: Not on file  Food Insecurity: Not on file  Transportation Needs: Not on file  Physical Activity: Not on file  Stress: Not on file  Social Connections: Not on file     Family History: The patient's   Father- 60 dementia.  Mother- healthy. Grandfather on mother's side had stroke.  ROS:   Please see the history of present illness.     All other systems reviewed and are negative.  EKGs/Labs/Other Studies Reviewed:    The following studies were reviewed today:   EKG:  EKG is  ordered today.  The ekg ordered today demonstrates   NSR, LBBB  Recent Labs: 03/25/2021: ALT 113; BUN 16; Creatinine, Ser 1.30; Hemoglobin 13.4; Platelets 185; Potassium 3.7; Sodium 129  Recent Lipid Panel No results found for: "CHOL", "TRIG", "HDL", "CHOLHDL", "VLDL", "LDLCALC", "LDLDIRECT"   Risk Assessment/Calculations:    The 10-year ASCVD risk score (  Arnett DK, et al., 2019) is: 18.9%   Values used to calculate the score:     Age: 35 years     Sex: Male     Is Non-Hispanic African American: No     Diabetic: Yes     Tobacco smoker: No     Systolic Blood Pressure: 841 mmHg     Is BP treated: Yes     HDL Cholesterol: 47 MG/DL     Total Cholesterol: 137 MG/DL   Physical Exam:    VS:   Vitals:   11/02/21 1452  BP: (!) 146/68  Pulse: 77  SpO2: 100%     Wt Readings from Last 3 Encounters:  11/02/21 220 lb 12.8 oz (100.2 kg)  05/25/21 215 lb (97.5 kg)  05/16/21 215 lb (97.5 kg)     GEN:  Well nourished, well developed in no acute distress HEENT: Normal NECK: No JVD; No carotid bruits LYMPHATICS: No lymphadenopathy CARDIAC: RRR, no murmurs, rubs, gallops RESPIRATORY:  Clear to auscultation without rales, wheezing or rhonchi  ABDOMEN: Soft, non-tender, non-distended MUSCULOSKELETAL:  No edema; No deformity  SKIN: Warm and dry NEUROLOGIC:  Alert and oriented x 3 PSYCHIATRIC:  Normal affect   ASSESSMENT:    #New LBBB: He has no known cardiac dx hx. He does not have anginal symptoms; however he has DM1 and he may not have typical cp. He has CAD risk; ASCVD 26%. His lexiscan showed no inducible defect and apical thinning artifact. This is reassuring.   #HLD: intolerant to statin and zetia. Considered for  PCSK9 or praluent with his endocrinologist at [place] they can continue to follow.  Last LDL was 99  Type 1 DM: followed by endocrinology at [facility]. A1c 6.9% 08/22/2021  #HTN:  on nebivolol 10 mg daily and valsartan 80 mg daily.  #CardioOnc: No hx of cardiotoxicity on doxorubicin. No hx of chest radiation.  PLAN:    In order of problems listed above:  Follow up PRN       Medication Adjustments/Labs and Tests Ordered: Current medicines are reviewed at length with the patient today.  Concerns regarding medicines are outlined above.  No orders of the defined types were placed in this encounter.  No orders of the defined types were placed in this encounter.    Patient Instructions  Medication Instructions:  The current medical regimen is effective;  continue present plan and medications as directed. Please refer to the Current Medication list given to you today.   *If you need a refill on your cardiac medications before your next appointment, please call your pharmacy*  Lab Work:   Testing/Procedures:  NONE    NONE  Follow-Up: Your next appointment:  AS NEEDED  with Janina Mayo, MD    At [facility], you and your health needs are our priority.  As part of our continuing mission to provide you with exceptional heart care, we have created designated Provider Care Teams.  These Care Teams include your primary Cardiologist (physician) and Advanced Practice Providers (APPs -  Physician Assistants and Nurse Practitioners) who all work together to provide you with the care you need, when you need it.  Important Information About Sugar             Signed, Janina Mayo, MD  11/02/2021 3:41 PM    [facility]

## 2021-11-10 DIAGNOSIS — E1065 Type 1 diabetes mellitus with hyperglycemia: Secondary | ICD-10-CM | POA: Diagnosis not present

## 2021-11-10 DIAGNOSIS — E109 Type 1 diabetes mellitus without complications: Secondary | ICD-10-CM | POA: Diagnosis not present

## 2021-11-10 DIAGNOSIS — Z9641 Presence of insulin pump (external) (internal): Secondary | ICD-10-CM | POA: Diagnosis not present

## 2021-11-16 DIAGNOSIS — I10 Essential (primary) hypertension: Secondary | ICD-10-CM | POA: Diagnosis not present

## 2021-11-16 DIAGNOSIS — E109 Type 1 diabetes mellitus without complications: Secondary | ICD-10-CM | POA: Diagnosis not present

## 2021-11-28 DIAGNOSIS — E109 Type 1 diabetes mellitus without complications: Secondary | ICD-10-CM | POA: Diagnosis not present

## 2021-11-28 DIAGNOSIS — E1065 Type 1 diabetes mellitus with hyperglycemia: Secondary | ICD-10-CM | POA: Diagnosis not present

## 2021-11-28 DIAGNOSIS — Z9641 Presence of insulin pump (external) (internal): Secondary | ICD-10-CM | POA: Diagnosis not present

## 2021-12-04 ENCOUNTER — Other Ambulatory Visit: Payer: Self-pay

## 2021-12-04 MED ORDER — CREON 24000-76000 UNITS PO CPEP: 5.0000 | ORAL_CAPSULE | Freq: Three times a day (TID) | ORAL | 2 refills | Status: DC

## 2021-12-11 DIAGNOSIS — E109 Type 1 diabetes mellitus without complications: Secondary | ICD-10-CM | POA: Diagnosis not present

## 2021-12-11 DIAGNOSIS — E1065 Type 1 diabetes mellitus with hyperglycemia: Secondary | ICD-10-CM | POA: Diagnosis not present

## 2021-12-11 DIAGNOSIS — Z9641 Presence of insulin pump (external) (internal): Secondary | ICD-10-CM | POA: Diagnosis not present

## 2022-01-11 DIAGNOSIS — E1065 Type 1 diabetes mellitus with hyperglycemia: Secondary | ICD-10-CM | POA: Diagnosis not present

## 2022-01-11 DIAGNOSIS — E109 Type 1 diabetes mellitus without complications: Secondary | ICD-10-CM | POA: Diagnosis not present

## 2022-01-11 DIAGNOSIS — Z9641 Presence of insulin pump (external) (internal): Secondary | ICD-10-CM | POA: Diagnosis not present

## 2022-02-10 DIAGNOSIS — E1065 Type 1 diabetes mellitus with hyperglycemia: Secondary | ICD-10-CM | POA: Diagnosis not present

## 2022-02-10 DIAGNOSIS — E109 Type 1 diabetes mellitus without complications: Secondary | ICD-10-CM | POA: Diagnosis not present

## 2022-02-10 DIAGNOSIS — Z9641 Presence of insulin pump (external) (internal): Secondary | ICD-10-CM | POA: Diagnosis not present

## 2022-02-15 DIAGNOSIS — Z23 Encounter for immunization: Secondary | ICD-10-CM | POA: Diagnosis not present

## 2022-02-15 DIAGNOSIS — I10 Essential (primary) hypertension: Secondary | ICD-10-CM | POA: Diagnosis not present

## 2022-02-15 DIAGNOSIS — E109 Type 1 diabetes mellitus without complications: Secondary | ICD-10-CM | POA: Diagnosis not present

## 2022-02-16 DIAGNOSIS — I129 Hypertensive chronic kidney disease with stage 1 through stage 4 chronic kidney disease, or unspecified chronic kidney disease: Secondary | ICD-10-CM | POA: Diagnosis not present

## 2022-02-16 DIAGNOSIS — E109 Type 1 diabetes mellitus without complications: Secondary | ICD-10-CM | POA: Diagnosis not present

## 2022-02-16 DIAGNOSIS — E1022 Type 1 diabetes mellitus with diabetic chronic kidney disease: Secondary | ICD-10-CM | POA: Diagnosis not present

## 2022-02-16 DIAGNOSIS — Z9641 Presence of insulin pump (external) (internal): Secondary | ICD-10-CM | POA: Diagnosis not present

## 2022-02-16 DIAGNOSIS — E10649 Type 1 diabetes mellitus with hypoglycemia without coma: Secondary | ICD-10-CM | POA: Diagnosis not present

## 2022-02-16 DIAGNOSIS — E1065 Type 1 diabetes mellitus with hyperglycemia: Secondary | ICD-10-CM | POA: Diagnosis not present

## 2022-03-13 DIAGNOSIS — E109 Type 1 diabetes mellitus without complications: Secondary | ICD-10-CM | POA: Diagnosis not present

## 2022-03-13 DIAGNOSIS — Z9641 Presence of insulin pump (external) (internal): Secondary | ICD-10-CM | POA: Diagnosis not present

## 2022-03-13 DIAGNOSIS — E1065 Type 1 diabetes mellitus with hyperglycemia: Secondary | ICD-10-CM | POA: Diagnosis not present

## 2022-03-23 ENCOUNTER — Telehealth: Payer: Self-pay

## 2022-03-23 NOTE — Telephone Encounter (Signed)
Patient Advocate Encounter   Received notification from [facility] that prior authorization for Creon [id number] is required.   PA submitted on 03/23/2022 Key [id number] Status is pending       Joneen Boers, [place] Patient Advocate Specialist [place] Patient Advocate Team Direct Number: [phone] Fax: [phone]

## 2022-03-26 NOTE — Telephone Encounter (Signed)
Pharmacy Patient Advocate Encounter  Prior Authorization for Creon [id number] has been approved.    PA# [id number] Effective dates: 03/23/2022 through 03/22/2023

## 2022-04-12 DIAGNOSIS — E109 Type 1 diabetes mellitus without complications: Secondary | ICD-10-CM | POA: Diagnosis not present

## 2022-04-12 DIAGNOSIS — Z9641 Presence of insulin pump (external) (internal): Secondary | ICD-10-CM | POA: Diagnosis not present

## 2022-04-12 DIAGNOSIS — E1065 Type 1 diabetes mellitus with hyperglycemia: Secondary | ICD-10-CM | POA: Diagnosis not present

## 2022-05-03 ENCOUNTER — Inpatient Hospital Stay (HOSPITAL_BASED_OUTPATIENT_CLINIC_OR_DEPARTMENT_OTHER)
Admission: EM | Admit: 2022-05-03 | Discharge: 2022-05-05 | DRG: 442 | Disposition: A | Discharge status: Home / Self Care | Payer: BC Managed Care – PPO | Attending: Internal Medicine | Admitting: Internal Medicine

## 2022-05-03 ENCOUNTER — Emergency Department (HOSPITAL_BASED_OUTPATIENT_CLINIC_OR_DEPARTMENT_OTHER): Payer: BC Managed Care – PPO

## 2022-05-03 ENCOUNTER — Other Ambulatory Visit: Payer: Self-pay

## 2022-05-03 ENCOUNTER — Encounter (HOSPITAL_COMMUNITY): Payer: Self-pay

## 2022-05-03 ENCOUNTER — Encounter (HOSPITAL_BASED_OUTPATIENT_CLINIC_OR_DEPARTMENT_OTHER): Payer: Self-pay | Admitting: Emergency Medicine

## 2022-05-03 DIAGNOSIS — D649 Anemia, unspecified: Secondary | ICD-10-CM | POA: Diagnosis present

## 2022-05-03 DIAGNOSIS — K769 Liver disease, unspecified: Secondary | ICD-10-CM | POA: Diagnosis not present

## 2022-05-03 DIAGNOSIS — E10649 Type 1 diabetes mellitus with hypoglycemia without coma: Secondary | ICD-10-CM | POA: Diagnosis not present

## 2022-05-03 DIAGNOSIS — R748 Abnormal levels of other serum enzymes: Secondary | ICD-10-CM | POA: Diagnosis present

## 2022-05-03 DIAGNOSIS — K8309 Other cholangitis: Secondary | ICD-10-CM | POA: Diagnosis not present

## 2022-05-03 DIAGNOSIS — R1013 Epigastric pain: Secondary | ICD-10-CM | POA: Diagnosis not present

## 2022-05-03 DIAGNOSIS — C8590 Non-Hodgkin lymphoma, unspecified, unspecified site: Secondary | ICD-10-CM | POA: Diagnosis present

## 2022-05-03 DIAGNOSIS — Z1152 Encounter for screening for COVID-19: Secondary | ICD-10-CM | POA: Diagnosis not present

## 2022-05-03 DIAGNOSIS — Z8572 Personal history of non-Hodgkin lymphomas: Secondary | ICD-10-CM | POA: Diagnosis not present

## 2022-05-03 DIAGNOSIS — Z888 Allergy status to other drugs, medicaments and biological substances status: Secondary | ICD-10-CM

## 2022-05-03 DIAGNOSIS — Z8507 Personal history of malignant neoplasm of pancreas: Secondary | ICD-10-CM

## 2022-05-03 DIAGNOSIS — Z91013 Allergy to seafood: Secondary | ICD-10-CM | POA: Diagnosis not present

## 2022-05-03 DIAGNOSIS — R21 Rash and other nonspecific skin eruption: Secondary | ICD-10-CM | POA: Diagnosis present

## 2022-05-03 DIAGNOSIS — K219 Gastro-esophageal reflux disease without esophagitis: Secondary | ICD-10-CM | POA: Diagnosis present

## 2022-05-03 DIAGNOSIS — K750 Abscess of liver: Principal | ICD-10-CM | POA: Diagnosis present

## 2022-05-03 DIAGNOSIS — R1084 Generalized abdominal pain: Secondary | ICD-10-CM | POA: Diagnosis not present

## 2022-05-03 DIAGNOSIS — E785 Hyperlipidemia, unspecified: Secondary | ICD-10-CM | POA: Diagnosis present

## 2022-05-03 DIAGNOSIS — Z79899 Other long term (current) drug therapy: Secondary | ICD-10-CM

## 2022-05-03 DIAGNOSIS — Z6831 Body mass index (BMI) 31.0-31.9, adult: Secondary | ICD-10-CM | POA: Diagnosis not present

## 2022-05-03 DIAGNOSIS — Z83719 Family history of colon polyps, unspecified: Secondary | ICD-10-CM

## 2022-05-03 DIAGNOSIS — I10 Essential (primary) hypertension: Secondary | ICD-10-CM | POA: Diagnosis not present

## 2022-05-03 DIAGNOSIS — Z9641 Presence of insulin pump (external) (internal): Secondary | ICD-10-CM | POA: Diagnosis present

## 2022-05-03 DIAGNOSIS — R7401 Elevation of levels of liver transaminase levels: Secondary | ICD-10-CM | POA: Diagnosis not present

## 2022-05-03 DIAGNOSIS — N179 Acute kidney failure, unspecified: Secondary | ICD-10-CM | POA: Diagnosis not present

## 2022-05-03 DIAGNOSIS — K5989 Other specified functional intestinal disorders: Secondary | ICD-10-CM | POA: Diagnosis not present

## 2022-05-03 DIAGNOSIS — K7460 Unspecified cirrhosis of liver: Secondary | ICD-10-CM | POA: Diagnosis present

## 2022-05-03 DIAGNOSIS — E10319 Type 1 diabetes mellitus with unspecified diabetic retinopathy without macular edema: Secondary | ICD-10-CM | POA: Diagnosis present

## 2022-05-03 DIAGNOSIS — Z90410 Acquired total absence of pancreas: Secondary | ICD-10-CM

## 2022-05-03 DIAGNOSIS — I447 Left bundle-branch block, unspecified: Secondary | ICD-10-CM | POA: Diagnosis present

## 2022-05-03 DIAGNOSIS — E871 Hypo-osmolality and hyponatremia: Secondary | ICD-10-CM | POA: Diagnosis not present

## 2022-05-03 DIAGNOSIS — E109 Type 1 diabetes mellitus without complications: Secondary | ICD-10-CM | POA: Diagnosis present

## 2022-05-03 DIAGNOSIS — Z9049 Acquired absence of other specified parts of digestive tract: Secondary | ICD-10-CM

## 2022-05-03 DIAGNOSIS — I700 Atherosclerosis of aorta: Secondary | ICD-10-CM | POA: Diagnosis not present

## 2022-05-03 DIAGNOSIS — Z90411 Acquired partial absence of pancreas: Secondary | ICD-10-CM | POA: Diagnosis not present

## 2022-05-03 DIAGNOSIS — E669 Obesity, unspecified: Secondary | ICD-10-CM | POA: Diagnosis not present

## 2022-05-03 DIAGNOSIS — R109 Unspecified abdominal pain: Secondary | ICD-10-CM | POA: Diagnosis present

## 2022-05-03 DIAGNOSIS — E872 Acidosis, unspecified: Secondary | ICD-10-CM | POA: Diagnosis not present

## 2022-05-03 DIAGNOSIS — R17 Unspecified jaundice: Secondary | ICD-10-CM | POA: Diagnosis not present

## 2022-05-03 DIAGNOSIS — E875 Hyperkalemia: Secondary | ICD-10-CM | POA: Diagnosis not present

## 2022-05-03 DIAGNOSIS — A419 Sepsis, unspecified organism: Secondary | ICD-10-CM

## 2022-05-03 DIAGNOSIS — Z794 Long term (current) use of insulin: Secondary | ICD-10-CM

## 2022-05-03 DIAGNOSIS — G4733 Obstructive sleep apnea (adult) (pediatric): Secondary | ICD-10-CM | POA: Diagnosis present

## 2022-05-03 DIAGNOSIS — Z7982 Long term (current) use of aspirin: Secondary | ICD-10-CM

## 2022-05-03 DIAGNOSIS — R9431 Abnormal electrocardiogram [ECG] [EKG]: Secondary | ICD-10-CM | POA: Diagnosis not present

## 2022-05-03 DIAGNOSIS — R1110 Vomiting, unspecified: Secondary | ICD-10-CM

## 2022-05-03 LAB — COMPREHENSIVE METABOLIC PANEL
ALT: 419 U/L — ABNORMAL HIGH (ref 0–44)
AST: 436 U/L — ABNORMAL HIGH (ref 15–41)
Albumin: 3.5 g/dL (ref 3.5–5.0)
Alkaline Phosphatase: 548 U/L — ABNORMAL HIGH (ref 38–126)
Anion gap: 13 (ref 5–15)
BUN: 21 mg/dL (ref 8–23)
CO2: 21 mmol/L — ABNORMAL LOW (ref 22–32)
Calcium: 8.4 mg/dL — ABNORMAL LOW (ref 8.9–10.3)
Chloride: 89 mmol/L — ABNORMAL LOW (ref 98–111)
Creatinine, Ser: 1.36 mg/dL — ABNORMAL HIGH (ref 0.61–1.24)
GFR, Estimated: 59 mL/min — ABNORMAL LOW (ref >60)
Glucose, Bld: 96 mg/dL (ref 70–99)
Potassium: 5.9 mmol/L — ABNORMAL HIGH (ref 3.5–5.1)
Sodium: 123 mmol/L — ABNORMAL LOW (ref 135–145)
Total Bilirubin: 6.6 mg/dL — ABNORMAL HIGH (ref 0.3–1.2)
Total Protein: 6.5 g/dL (ref 6.5–8.1)

## 2022-05-03 LAB — CBC
HCT: 37.8 % — ABNORMAL LOW (ref 39.0–52.0)
HCT: 35.8 % — ABNORMAL LOW (ref 39.0–52.0)
Hemoglobin: 13.3 g/dL (ref 13.0–17.0)
Hemoglobin: 12.3 g/dL — ABNORMAL LOW (ref 13.0–17.0)
MCH: 29.4 pg (ref 26.0–34.0)
MCH: 29 pg (ref 26.0–34.0)
MCHC: 35.2 g/dL (ref 30.0–36.0)
MCHC: 34.4 g/dL (ref 30.0–36.0)
MCV: 83.4 fL (ref 80.0–100.0)
MCV: 84.4 fL (ref 80.0–100.0)
Platelets: 188 10*3/uL (ref 150–400)
Platelets: 146 10*3/uL — ABNORMAL LOW (ref 150–400)
RBC: 4.53 MIL/uL (ref 4.22–5.81)
RBC: 4.24 MIL/uL (ref 4.22–5.81)
RDW: 15.5 % (ref 11.5–15.5)
RDW: 15.8 % — ABNORMAL HIGH (ref 11.5–15.5)
WBC: 23 10*3/uL — ABNORMAL HIGH (ref 4.0–10.5)
WBC: 15.2 10*3/uL — ABNORMAL HIGH (ref 4.0–10.5)
nRBC: 0 % (ref 0.0–0.2)
nRBC: 0 % (ref 0.0–0.2)

## 2022-05-03 LAB — RESP PANEL BY RT-PCR (RSV, FLU A&B, COVID)  RVPGX2
Influenza A by PCR: NEGATIVE
Influenza B by PCR: NEGATIVE
Resp Syncytial Virus by PCR: NEGATIVE
SARS Coronavirus 2 by RT PCR: NEGATIVE

## 2022-05-03 LAB — URINALYSIS, ROUTINE W REFLEX MICROSCOPIC
Glucose, UA: NEGATIVE mg/dL
Ketones, ur: NEGATIVE mg/dL
Leukocytes,Ua: NEGATIVE
Nitrite: NEGATIVE
Protein, ur: 30 mg/dL — AB
Specific Gravity, Urine: 1.029 (ref 1.005–1.030)
pH: 6 (ref 5.0–8.0)

## 2022-05-03 LAB — BASIC METABOLIC PANEL
Anion gap: 10 (ref 5–15)
BUN: 19 mg/dL (ref 8–23)
CO2: 22 mmol/L (ref 22–32)
Calcium: 7.8 mg/dL — ABNORMAL LOW (ref 8.9–10.3)
Chloride: 94 mmol/L — ABNORMAL LOW (ref 98–111)
Creatinine, Ser: 1.08 mg/dL (ref 0.61–1.24)
GFR, Estimated: >60 mL/min (ref >60)
Glucose, Bld: 100 mg/dL — ABNORMAL HIGH (ref 70–99)
Potassium: 3.8 mmol/L (ref 3.5–5.1)
Sodium: 126 mmol/L — ABNORMAL LOW (ref 135–145)

## 2022-05-03 LAB — CREATININE, SERUM
Creatinine, Ser: 0.94 mg/dL (ref 0.61–1.24)
GFR, Estimated: >60 mL/min (ref >60)

## 2022-05-03 LAB — LIPASE, BLOOD: Lipase: <10 U/L — ABNORMAL LOW (ref 11–51)

## 2022-05-03 LAB — HIV ANTIBODY (ROUTINE TESTING W REFLEX): HIV Screen 4th Generation wRfx: NONREACTIVE

## 2022-05-03 LAB — LACTIC ACID, PLASMA
Lactic Acid, Venous: 2 mmol/L (ref 0.5–1.9)
Lactic Acid, Venous: 2.1 mmol/L (ref 0.5–1.9)
Lactic Acid, Venous: 1.2 mmol/L (ref 0.5–1.9)
Lactic Acid, Venous: 1.6 mmol/L (ref 0.5–1.9)

## 2022-05-03 MED ORDER — METRONIDAZOLE 500 MG/100ML IV SOLN
500.0000 mg | Freq: Once | INTRAVENOUS | Status: AC
  Administered 2022-05-03: 500 mg via INTRAVENOUS
  Filled 2022-05-03: qty 100

## 2022-05-03 MED ORDER — NEBIVOLOL HCL 10 MG PO TABS
10.0000 mg | ORAL_TABLET | Freq: Every day | ORAL | Status: DC
  Administered 2022-05-04 – 2022-05-05 (×2): 10 mg via ORAL
  Filled 2022-05-03 (×2): qty 1

## 2022-05-03 MED ORDER — SODIUM CHLORIDE 0.9 % IV SOLN
100.0000 mg | Freq: Once | INTRAVENOUS | Status: AC
  Administered 2022-05-03: 100 mg via INTRAVENOUS
  Filled 2022-05-03: qty 100

## 2022-05-03 MED ORDER — SODIUM CHLORIDE 0.9 % IV BOLUS
1000.0000 mL | Freq: Once | INTRAVENOUS | Status: AC
  Administered 2022-05-03: 1000 mL via INTRAVENOUS

## 2022-05-03 MED ORDER — METRONIDAZOLE 500 MG/100ML IV SOLN
500.0000 mg | Freq: Two times a day (BID) | INTRAVENOUS | Status: DC
  Administered 2022-05-03 – 2022-05-05 (×4): 500 mg via INTRAVENOUS
  Filled 2022-05-03 (×4): qty 100

## 2022-05-03 MED ORDER — SODIUM CHLORIDE 0.9 % IV SOLN
2.0000 g | Freq: Every day | INTRAVENOUS | Status: DC
  Administered 2022-05-04 – 2022-05-05 (×2): 2 g via INTRAVENOUS
  Filled 2022-05-03 (×2): qty 20

## 2022-05-03 MED ORDER — PANCRELIPASE (LIP-PROT-AMYL) 12000-38000 UNITS PO CPEP
120000.0000 [IU] | ORAL_CAPSULE | Freq: Three times a day (TID) | ORAL | Status: DC
  Administered 2022-05-04 – 2022-05-05 (×5): 120000 [IU] via ORAL

## 2022-05-03 MED ORDER — ONDANSETRON HCL 4 MG PO TABS: 4.0000 mg | ORAL_TABLET | Freq: Four times a day (QID) | ORAL | Status: DC | PRN

## 2022-05-03 MED ORDER — SODIUM CHLORIDE 0.9 % IV SOLN: INTRAVENOUS | Status: DC

## 2022-05-03 MED ORDER — ENOXAPARIN SODIUM 40 MG/0.4ML IJ SOSY
40.0000 mg | PREFILLED_SYRINGE | INTRAMUSCULAR | Status: DC
  Filled 2022-05-03: qty 0.4

## 2022-05-03 MED ORDER — ONDANSETRON HCL 4 MG/2ML IJ SOLN: 4.0000 mg | Freq: Four times a day (QID) | INTRAMUSCULAR | Status: DC | PRN

## 2022-05-03 MED ORDER — ACETAMINOPHEN 650 MG RE SUPP: 650.0000 mg | Freq: Four times a day (QID) | RECTAL | Status: DC | PRN

## 2022-05-03 MED ORDER — SODIUM CHLORIDE 0.9 % IV SOLN
2.0000 g | Freq: Once | INTRAVENOUS | Status: AC
  Administered 2022-05-03: 2 g via INTRAVENOUS
  Filled 2022-05-03: qty 20

## 2022-05-03 MED ORDER — ACETAMINOPHEN 325 MG PO TABS: 650.0000 mg | ORAL_TABLET | Freq: Four times a day (QID) | ORAL | Status: DC | PRN

## 2022-05-03 MED ORDER — IOHEXOL 300 MG/ML  SOLN
100.0000 mL | Freq: Once | INTRAMUSCULAR | Status: AC | PRN
  Administered 2022-05-03: 85 mL via INTRAVENOUS

## 2022-05-03 MED ORDER — PANTOPRAZOLE SODIUM 40 MG PO TBEC
40.0000 mg | DELAYED_RELEASE_TABLET | Freq: Every day | ORAL | Status: DC
  Administered 2022-05-04 – 2022-05-05 (×2): 40 mg via ORAL
  Filled 2022-05-03 (×2): qty 1

## 2022-05-03 MED ORDER — LACTATED RINGERS IV SOLN: INTRAVENOUS | Status: AC

## 2022-05-03 MED ORDER — ASPIRIN 81 MG PO TBEC
81.0000 mg | DELAYED_RELEASE_TABLET | Freq: Every day | ORAL | Status: DC
  Administered 2022-05-04 – 2022-05-05 (×2): 81 mg via ORAL
  Filled 2022-05-03 (×2): qty 1

## 2022-05-03 NOTE — Progress Notes (Signed)
Patient declines nocturnal CPAP. He states that he has an oral device that he uses to treat his sleep disorder.

## 2022-05-03 NOTE — Progress Notes (Signed)
Pt arrived from [place], alert and oriented and voices no complaints. Pt has iv antibiotics infusing at this time. Vss. Tolerated po liquids. Insulin pump is intact and reading blood sugar of 137. EKG done and placed paper copy in chart.

## 2022-05-03 NOTE — ED Notes (Signed)
Unable to obtain 2nd set of blood culture, unsuccessful x2. 1st blood culture drawn before antibiotic.

## 2022-05-03 NOTE — ED Provider Notes (Signed)
[facility] EMERGENCY DEPT Provider Note   CSN: [id number] Arrival date & time: 05/03/22  6734     History  Chief Complaint  Patient presents with   Abdominal Pain    Robert Mercer is a 61 y.o. male.  Patient is a 61 year old male with a history of prior pancreatic cancer status post Whipple procedure in 2003.  He also had a ventral hernia repair in 2018.  He was admitted last year for sepsis and a partial small bowel obstruction.  He also has a history of diabetes, obstructive sleep apnea and hypertension.  He presents with a 2-day history of worsening pain to his mid abdomen.  He says its intermittent.  He had 1 episode of vomiting after drinking some orange juice yesterday morning but none since then.  He is having some loose stools.  He says he has not really had normal stools since his Whipple procedure.  He denies any fevers.  No urinary symptoms.  No cough or cold symptoms.       Home Medications Prior to Admission medications   Medication Sig Start Date End Date Taking? Authorizing Provider  albuterol (VENTOLIN HFA) 108 (90 Base) MCG/ACT inhaler Inhale 2 puffs into the lungs every 6 (six) hours as needed for wheezing or shortness of breath. 03/25/21  Yes Domenic Polite, MD  aspirin 81 MG tablet Take 81 mg by mouth daily.   Yes [provider]  BYSTOLIC 10 MG tablet Take 10 mg by mouth daily. 08/08/16  Yes [provider]  cyanocobalamin (,VITAMIN B-12,) 1000 MCG/ML injection Inject 1,000 mcg into the muscle every 30 (thirty) days.    Yes [provider]  glucosamine-chondroitin 500-400 MG tablet Take 1 tablet by mouth daily.    Yes [provider]  NOVOLOG 100 UNIT/ML injection Mini med insulin pump 07/06/12  Yes [provider]  Pancrelipase, Lip-Prot-Amyl, (CREON) 24000-76000 units CPEP Take 5-6 capsules (120,000-144,000 Units total) by mouth 3 (three) times daily with meals. Take 3 capsules with snacks. 12/04/21  Yes  Cirigliano, Vito V, DO  pantoprazole (PROTONIX) 40 MG tablet Take 40 mg by mouth daily. 04/13/21  Yes [provider]  valsartan (DIOVAN) 80 MG tablet Take 1 tablet (80 mg total) by mouth daily. 03/25/21  Yes Domenic Polite, MD  Vitamin D, Ergocalciferol, (DRISDOL) 50000 UNITS CAPS Take 50,000 Units by mouth every Monday.    Yes [provider]      Allergies    Azilsartan, Chlorthalidone, Ezetimibe, Shellfish allergy, Statins, Amlodipine, Indapamide, and Lisinopril    Review of Systems   Review of Systems  Constitutional:  Negative for chills, diaphoresis, fatigue and fever.  HENT:  Negative for congestion, rhinorrhea and sneezing.   Eyes: Negative.   Respiratory:  Negative for cough, chest tightness and shortness of breath.   Cardiovascular:  Negative for chest pain and leg swelling.  Gastrointestinal:  Positive for abdominal pain, nausea and vomiting. Negative for blood in stool and diarrhea.  Genitourinary:  Negative for difficulty urinating, flank pain, frequency and hematuria.  Musculoskeletal:  Negative for arthralgias and back pain.  Skin:  Negative for rash.  Neurological:  Negative for dizziness, speech difficulty, weakness, numbness and headaches.    Physical Exam Updated Vital Signs BP (!) 149/57   Pulse 77   Temp 98.5 F (36.9 C) (Oral)   Resp 19   Ht '5\' 9"'$  (1.753 m)   Wt 95.3 kg   SpO2 97%   BMI 31.01 kg/m  Physical Exam Constitutional:  Appearance: He is well-developed.  HENT:     Head: Normocephalic and atraumatic.  Eyes:     General: Scleral icterus present.     Pupils: Pupils are equal, round, and reactive to light.  Cardiovascular:     Rate and Rhythm: Normal rate and regular rhythm.     Heart sounds: Normal heart sounds.  Pulmonary:     Effort: Pulmonary effort is normal. No respiratory distress.     Breath sounds: Normal breath sounds. No wheezing or rales.  Chest:     Chest wall: No tenderness.  Abdominal:     General:  Bowel sounds are normal.     Palpations: Abdomen is soft.     Tenderness: There is abdominal tenderness. There is no guarding or rebound.     Comments: Large midline scar from prior surgery.  He has some mild tenderness around this area.  He has a palpable hernia which is easily reduced.  He has some slight erythema to his anterior abdominal wall around the prior scar but it is not tender.  No significant warmth.  No palpable induration or fluctuance.  Musculoskeletal:        General: Normal range of motion.     Cervical back: Normal range of motion and neck supple.  Lymphadenopathy:     Cervical: No cervical adenopathy.  Skin:    General: Skin is warm and dry.     Findings: No rash.  Neurological:     Mental Status: He is alert and oriented to person, place, and time.     ED Results / Procedures / Treatments   Labs (all labs ordered are listed, but only abnormal results are displayed) Labs Reviewed  LIPASE, BLOOD - Abnormal; Notable for the following components:      Result Value   Lipase <10 (*)    All other components within normal limits  COMPREHENSIVE METABOLIC PANEL - Abnormal; Notable for the following components:   Sodium 123 (*)    Potassium 5.9 (*)    Chloride 89 (*)    CO2 21 (*)    Creatinine, Ser 1.36 (*)    Calcium 8.4 (*)    AST 436 (*)    ALT 419 (*)    Alkaline Phosphatase 548 (*)    Total Bilirubin 6.6 (*)    GFR, Estimated 59 (*)    All other components within normal limits  CBC - Abnormal; Notable for the following components:   WBC 23.0 (*)    HCT 37.8 (*)    All other components within normal limits  URINALYSIS, ROUTINE W REFLEX MICROSCOPIC - Abnormal; Notable for the following components:   Hgb urine dipstick MODERATE (*)    Bilirubin Urine MODERATE (*)    Protein, ur 30 (*)    Bacteria, UA RARE (*)    All other components within normal limits  LACTIC ACID, PLASMA - Abnormal; Notable for the following components:   Lactic Acid, Venous 2.0 (*)     All other components within normal limits  LACTIC ACID, PLASMA - Abnormal; Notable for the following components:   Lactic Acid, Venous 2.1 (*)    All other components within normal limits  BASIC METABOLIC PANEL - Abnormal; Notable for the following components:   Sodium 126 (*)    Chloride 94 (*)    Glucose, Bld 100 (*)    Calcium 7.8 (*)    All other components within normal limits  CULTURE, BLOOD (ROUTINE X 2)  CULTURE, BLOOD (ROUTINE X  2)  URINE CULTURE  RESP PANEL BY RT-PCR (RSV, FLU A&B, COVID)  RVPGX2  LACTIC ACID, PLASMA  LACTIC ACID, PLASMA    EKG EKG Interpretation  Date/Time:  Thursday May 03 2022 14:12:13 EST Ventricular Rate:  75 PR Interval:  167 QRS Duration: 156 QT Interval:  452 QTC Calculation: 505 R Axis:   64 Text Interpretation: Sinus rhythm Left bundle branch block Baseline wander in lead(s) V2 since last tracing no significant change Confirmed by Malvin Johns [phone]) on 05/03/2022 2:24:30 PM  Radiology DG Chest Port 1 View  Result Date: 05/03/2022 CLINICAL DATA:  Possible sepsis, epigastric pain EXAM: PORTABLE CHEST 1 VIEW COMPARISON:  03/21/2021 FINDINGS: Transverse diameter of heart is increased. Increased interstitial markings are seen in both lungs. Similar finding was seen in the previous study. There is no focal consolidation. There is no pleural effusion or pneumothorax. IMPRESSION: Increased interstitial markings are seen in both lungs suggesting scarring or mild interstitial edema or interstitial pneumonia. There is no focal consolidation. There is no pleural effusion or pneumothorax. Electronically Signed   By: Elmer Picker M.D.   On: 05/03/2022 12:35   CT Abdomen Pelvis W Contrast  Result Date: 05/03/2022 CLINICAL DATA:  61 year old male with 2 days of abdominal pain. Epigastric pain. History of pancreatic lymphoma and remote Whipple surgery. EXAM: CT ABDOMEN AND PELVIS WITH CONTRAST TECHNIQUE: Multidetector CT imaging of the  abdomen and pelvis was performed using the standard protocol following bolus administration of intravenous contrast. RADIATION DOSE REDUCTION: This exam was performed according to the departmental dose-optimization program which includes automated exposure control, adjustment of the mA and/or kV according to patient size and/or use of iterative reconstruction technique. CONTRAST:  82m OMNIPAQUE IOHEXOL 300 MG/ML  SOLN COMPARISON:  CT Abdomen and Pelvis 03/21/2021 and earlier. Abdomen MRI 04/20/2021. FINDINGS: Lower chest: Small/trace new layering right pleural effusion. Trace layering left pleural fluid is also new or increased. Stable cardiac size, within normal limits. No pericardial effusion. Mildly dilated and fluid-filled distal esophagus (series 2, image 7), but the GEJ is tapered and the stomach is decompressed (see below). Hepatobiliary: Chronic but increased generalized intrahepatic biliary ductal dilatation (series 2, image 20 versus series 2, image 21 a year ago). Resolved pneumobilia since last year. Otherwise liver enhancement is within normal limits, no discrete liver lesion. Distal extrahepatic bile ducts seem decompressed. Pancreas: Stable surgical absence status post Whipple. Spleen: Stable, negative. Adrenals/Urinary Tract: Stable and negative adrenal glands, kidneys. No hydronephrosis or hydroureter. 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 Stomach/Bowel: Mild motion artifact. Negative large bowel including the appendix (coronal image 60). Decompressed terminal ileum. No acutely dilated small bowel in the abdomen or pelvis. A capacious right abdominal small bowel anastomosis seen coronal images 83 through 103 are stable or smaller compared to the CT last year. Associated ventral abdominal rectus muscle diastasis and/or marginal left paramidline hernia on series 2, image 40 does not appear inflamed. Superimposed sequelae of Whipple procedure are stable. Stomach and gastrojejunostomy  are decompressed. No free air or free fluid. Vascular/Lymphatic: Extensive Aortoiliac calcified atherosclerosis. Major arterial structures in the abdomen and pelvis remain patent. No lymphadenopathy identified. Portal venous system appears to be patent. Reproductive: Bladder containing left inguinal hernia redemonstrated (series 2, image 77), 11 cm in length, with a roughly 5 cm segment of herniated urinary bladder along with fat/mesentery in the inguinal canal. Underlying small bladder volume. No active inflammation identified. Other: No pelvic free fluid. Musculoskeletal: No acute or suspicious osseous lesion identified. IMPRESSION:  1. Negative for bowel obstruction. Bowel appears stable status post Whipple related bypass. But fluid-filled distal esophagus is new from last year, query Gastroesophageal Reflux or Esophageal Dysmotility. 2. Progressed generalized Intrahepatic Biliary Ductal Dilatation since last year. But extrahepatic bile ducts seem decompressed. If there is hyperbilirubinemia then repeat MRCP may be valuable when the patient can breath hold. 3. Small/trace Pleural Effusion(s), new from last year and nonspecific. 4. Other stable sequelae of Whipple. 5.  Aortic Atherosclerosis (ICD10-I70.0). Electronically Signed   By: Genevie Ann M.D.   On: 05/03/2022 10:54    Procedures Procedures    Medications Ordered in ED Medications  lactated ringers infusion ( Intravenous New Bag/Given 05/03/22 1136)  doxycycline (VIBRAMYCIN) 100 mg in sodium chloride 0.9 % 250 mL IVPB (has no administration in time range)  sodium chloride 0.9 % bolus 1,000 mL (0 mLs Intravenous Stopped 05/03/22 1400)  iohexol (OMNIPAQUE) 300 MG/ML solution 100 mL (85 mLs Intravenous Contrast Given 05/03/22 1032)  cefTRIAXone (ROCEPHIN) 2 g in sodium chloride 0.9 % 100 mL IVPB (0 g Intravenous Stopped 05/03/22 1202)  metroNIDAZOLE (FLAGYL) IVPB 500 mg (0 mg Intravenous Stopped 05/03/22 1238)  sodium chloride 0.9 % bolus 1,000 mL  (1,000 mLs Intravenous New Bag/Given 05/03/22 1406)    ED Course/ Medical Decision Making/ A&P                           Medical Decision Making Amount and/or Complexity of Data Reviewed Labs: ordered. Radiology: ordered.  Risk Prescription drug management. Decision regarding hospitalization.   Patient is a 61 year old male who presents with abdominal pain.  His labs show a markedly elevated WBC count of 24,000.  His LFTs are also elevated.  On chart review, they were also elevated during his hospitalization previously for sepsis.  He reports they had improved or resolved in the interim that we do not have any outpatient blood work to compare.  His lactate was elevated.  Given this, he was treated for sepsis with IV fluids and antibiotics to cover potential abdominal source.  He was given Rocephin and Flagyl.  CT of the abdomen pelvis was performed which shows no acute abnormalities.  There is some possible dilatation of the intrahepatic ducts.  On his other labs, his sodium was low.  His potassium is mildly elevated.  His EKG shows a left bundle branch blocks but looks unchanged from previous based on my review.  Chest x-ray 2 view was interpreted by me and confirmed by the radiologist to show some interstitial markings concerning for possible pneumonia.  Respiratory panel was added  Initially was getting give Zithromax but his QTc is prolonged.  Consulted with the pharmacist who recommended doxycycline.  I did speak with Janett Billow with [facility] gastroenterology.  They will be happy to consult on the patient.  I also spoke with Dr. Marylyn Ishihara with the hospitalist service to admit the patient for further treatment.  CRITICAL CARE Performed by: Malvin Johns Total critical care time: 90 minutes Critical care time was exclusive of separately billable procedures and treating other patients. Critical care was necessary to treat or prevent imminent or life-threatening deterioration. Critical care was time  spent personally by me on the following activities: development of treatment plan with patient and/or surrogate as well as nursing, discussions with consultants, evaluation of patient's response to treatment, examination of patient, obtaining history from patient or surrogate, ordering and performing treatments and interventions, ordering and review of laboratory studies, ordering and review  of radiographic studies, pulse oximetry and re-evaluation of patient's condition.     Final Clinical Impression(s) / ED Diagnoses Final diagnoses:  Sepsis, due to unspecified organism, unspecified whether acute organ dysfunction present ([place])  Transaminitis  Generalized abdominal pain  Hyponatremia    Rx / DC Orders ED Discharge Orders     None         Malvin Johns, MD 05/03/22 [phone]

## 2022-05-03 NOTE — Sepsis Progress Note (Signed)
Elink is tracking the Code Sepsis.

## 2022-05-03 NOTE — ED Triage Notes (Signed)
Pt arrives to ED with c/o epigastric abdominal pain x2 days ago.

## 2022-05-03 NOTE — H&P (Addendum)
History and Physical    Robert Mercer  VZD:[id number]  DOB: 04-20-1961  DOA: 05/03/2022 PCP: Haywood Pao, MD   Patient coming from: Home  Chief Complaint: Abdominal pain  HPI: Robert Mercer is a 61 y.o. 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  States that the pain started yesterday but has since resolved.  He had 1 episode of vomiting which was nonbloody.  Currently, he has no abdominal pain or nausea.  He did have some loose stool but this has not recurred either and overall, he states he is feeling much better. In the ED, he was noted to have numerous abnormal labs including elevated liver function tests hyponatremia and hyperkalemia.  ED Course: Directly admitted from [facility] where he was treated with ceftriaxone, doxycycline, metronidazole, normal saline fluid boluses followed by Ringer lactate infusion  Review of Systems:  All other systems reviewed and apart from HPI, are negative.  Past Medical History:  Diagnosis Date   Arthritis    "left ankle; I broke it in high school" (09/03/2016)   Childhood asthma    Diabetes mellitus without complication (HCC)    Novolog insulin pump.Average fasting blood sugar runs 145   Diabetic retinopathy associated with type 1 diabetes mellitus ([place])    Diffuse large B cell lymphoma (HCC)    Dyslipidemia    GERD (gastroesophageal reflux disease)    takes Omeprazole daily   History of blood transfusion 2003   related to Whipple; developed itching/blotching X 1; transfused later also related to the Whipple; no problems" (09/03/2016)   Hypertension    takes Bystolic and Diovan daily   Joint pain    Nocturia    Pancreatic lymphoma ([place]) 02/14/2002   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 Valere Dross   Sleep  apnea    mild to moderate; "couldn't tolerate CPAP; I wear an appliance" (09/03/2016)   Vitamin B12 deficiency    takes B12 every 30 days.   Vitamin D deficiency    takes Vitamin D daily    Past Surgical History:  Procedure Laterality Date   CHOLECYSTECTOMY  2003   COLONOSCOPY  05/2007   ESOPHAGOGASTRODUODENOSCOPY     EUS N/A 07/23/2012   Procedure: FULL UPPER ENDOSCOPIC ULTRASOUND (EUS) RADIAL;  Surgeon: Arta Silence, MD;  Location: [facility];  Service: Endoscopy;  Laterality: N/A;   HERNIA REPAIR     INSERTION OF MESH N/A 09/03/2016   Procedure: INSERTION OF MESH;  Surgeon: Armandina Gemma, MD;  Location: [place];  Service: General;  Laterality: N/A;   LAPAROSCOPIC INCISIONAL / UMBILICAL / [place]     w/mesh/notes 09/03/2016   percuteaneous puncture  X 3   to place stent in one of my ducts related to scar tissue from [place] N/A 09/03/2016   Procedure: [place];  Surgeon: Armandina Gemma, MD;  Location: [place];  Service: General;  Laterality: N/A;   WHIPPLE PROCEDURE  2003   pancreatic lymphoma   WISDOM TOOTH EXTRACTION      Social History:   reports that he has never smoked. He has never used smokeless tobacco. He reports current alcohol use of about 6.0 standard drinks of alcohol per week. He reports that he does  not use drugs.  Allergies  Allergen Reactions   Azilsartan Other (See Comments)    Possibly caused hives (as Edarbyclor) on 06/16/14   Chlorthalidone Other (See Comments)    Possibly caused hives (as Edarbyclor) on 06/16/14   Ezetimibe Other (See Comments)   Shellfish Allergy    Statins Other (See Comments)   Amlodipine Hives, Itching and Rash   Indapamide Rash    Pruritic rash   Lisinopril Hives, Itching and Rash    Family History  Problem Relation Age of Onset   Colon polyps Maternal Grandfather    Colon cancer Neg Hx    Rectal cancer Neg Hx      Prior to  Admission medications   Medication Sig Start Date End Date Taking? Authorizing Provider  albuterol (VENTOLIN HFA) 108 (90 Base) MCG/ACT inhaler Inhale 2 puffs into the lungs every 6 (six) hours as needed for wheezing or shortness of breath. 03/25/21  Yes Domenic Polite, MD  aspirin 81 MG tablet Take 81 mg by mouth daily.   Yes [provider]  BYSTOLIC 10 MG tablet Take 10 mg by mouth daily. 08/08/16  Yes [provider]  cyanocobalamin (,VITAMIN B-12,) 1000 MCG/ML injection Inject 1,000 mcg into the muscle every 30 (thirty) days.    Yes [provider]  glucosamine-chondroitin 500-400 MG tablet Take 1 tablet by mouth daily.    Yes [provider]  NOVOLOG 100 UNIT/ML injection Mini med insulin pump 07/06/12  Yes [provider]  Pancrelipase, Lip-Prot-Amyl, (CREON) 24000-76000 units CPEP Take 5-6 capsules (120,000-144,000 Units total) by mouth 3 (three) times daily with meals. Take 3 capsules with snacks. 12/04/21  Yes Cirigliano, Vito V, DO  pantoprazole (PROTONIX) 40 MG tablet Take 40 mg by mouth daily. 04/13/21  Yes [provider]  valsartan (DIOVAN) 80 MG tablet Take 1 tablet (80 mg total) by mouth daily. 03/25/21  Yes Domenic Polite, MD  Vitamin D, Ergocalciferol, (DRISDOL) 50000 UNITS CAPS Take 50,000 Units by mouth every Monday.    Yes [provider]    Physical Exam: Wt Readings from Last 3 Encounters:  05/03/22 95.3 kg  11/02/21 100.2 kg  05/25/21 97.5 kg   Vitals:   05/03/22 1408 05/03/22 1415 05/03/22 1500 05/03/22 1530  BP:  (!) 125/53 (!) 134/51 (!) 136/54  Pulse:  76  72  Resp:  '19 19 17  '$ Temp: 98.5 F (36.9 C)     TempSrc: Oral     SpO2:  93%  95%  Weight:      Height:          Constitutional:  Calm & comfortable Eyes: PERRLA, lids and conjunctivae normal ENT:  Mucous membranes are moist.  Pharynx clear of exudate   Normal dentition.  Respiratory:  Clear to auscultation bilaterally  Normal  respiratory effort.  Cardiovascular:  S1 & S2 heard, regular rate and rhythm Murmur in the right upper sternal border Abdomen:  Non distended No tenderness, He has a ventral abdominal hernia Bowel sounds normal Extremities:  No clubbing / cyanosis No pedal edema  Skin:  Patient has an erythematous rash on his abdomen which is nonpruritic Numerous scratch marks on his arms Neurologic:  AAO x 3 CN 2-12 grossly intact Sensation intact Strength 5/5 in all 4 extremities Psychiatric:  Normal Mood and affect    Labs on Admission: I have personally reviewed following labs and imaging studies  CBC: Recent Labs  Lab 05/03/22 0913  WBC 23.0*  HGB 13.3  HCT 37.8*  MCV 83.4  PLT 637   Basic Metabolic Panel: Recent Labs  Lab 05/03/22 0913 05/03/22 1404  NA 123* 126*  K 5.9* 3.8  CL 89* 94*  CO2 21* 22  GLUCOSE 96 100*  BUN 21 19  CREATININE 1.36* 1.08  CALCIUM 8.4* 7.8*   GFR: Estimated Creatinine Clearance: 80.7 mL/min (by C-G formula based on SCr of 1.08 mg/dL). Liver Function Tests: Recent Labs  Lab 05/03/22 0913  AST 436*  ALT 419*  ALKPHOS 548*  BILITOT 6.6*  PROT 6.5  ALBUMIN 3.5   Recent Labs  Lab 05/03/22 0913  LIPASE <10*       Component Value Date/Time   COLORURINE YELLOW 05/03/2022 0935   APPEARANCEUR CLEAR 05/03/2022 0935   LABSPEC 1.029 05/03/2022 0935   PHURINE 6.0 05/03/2022 0935   GLUCOSEU NEGATIVE 05/03/2022 0935   HGBUR MODERATE (A) 05/03/2022 0935   BILIRUBINUR MODERATE (A) 05/03/2022 0935   KETONESUR NEGATIVE 05/03/2022 0935   PROTEINUR 30 (A) 05/03/2022 0935   NITRITE NEGATIVE 05/03/2022 0935   LEUKOCYTESUR NEGATIVE 05/03/2022 0935   Sepsis Labs: '@LABRCNTIP'$ (procalcitonin:4,lacticidven:4) )    Radiological Exams on Admission: DG Chest Port 1 View  Result Date: 05/03/2022 CLINICAL DATA:  Possible sepsis, epigastric pain EXAM: PORTABLE CHEST 1 VIEW COMPARISON:  03/21/2021 FINDINGS: Transverse diameter of heart is  increased. Increased interstitial markings are seen in both lungs. Similar finding was seen in the previous study. There is no focal consolidation. There is no pleural effusion or pneumothorax. IMPRESSION: Increased interstitial markings are seen in both lungs suggesting scarring or mild interstitial edema or interstitial pneumonia. There is no focal consolidation. There is no pleural effusion or pneumothorax. Electronically Signed   By: Elmer Picker M.D.   On: 05/03/2022 12:35   CT Abdomen Pelvis W Contrast  Result Date: 05/03/2022 CLINICAL DATA:  61 year old male with 2 days of abdominal pain. Epigastric pain. History of pancreatic lymphoma and remote Whipple surgery. EXAM: CT ABDOMEN AND PELVIS WITH CONTRAST TECHNIQUE: Multidetector CT imaging of the abdomen and pelvis was performed using the standard protocol following bolus administration of intravenous contrast. RADIATION DOSE REDUCTION: This exam was performed according to the departmental dose-optimization program which includes automated exposure control, adjustment of the mA and/or kV according to patient size and/or use of iterative reconstruction technique. CONTRAST:  37m OMNIPAQUE IOHEXOL 300 MG/ML  SOLN COMPARISON:  CT Abdomen and Pelvis 03/21/2021 and earlier. Abdomen MRI 04/20/2021. FINDINGS: Lower chest: Small/trace new layering right pleural effusion. Trace layering left pleural fluid is also new or increased. Stable cardiac size, within normal limits. No pericardial effusion. Mildly dilated and fluid-filled distal esophagus (series 2, image 7), but the GEJ is tapered and the stomach is decompressed (see below). Hepatobiliary: Chronic but increased generalized intrahepatic biliary ductal dilatation (series 2, image 20 versus series 2, image 21 a year ago). Resolved pneumobilia since last year. Otherwise liver enhancement is within normal limits, no discrete liver lesion. Distal extrahepatic bile ducts seem decompressed. Pancreas:  Stable surgical absence status post Whipple. Spleen: Stable, negative. Adrenals/Urinary Tract: Stable and negative adrenal glands, kidneys. No hydronephrosis or hydroureter. 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 Stomach/Bowel: Mild motion artifact. Negative large bowel including the appendix (coronal image 60). Decompressed terminal ileum. No acutely dilated small bowel in the abdomen or pelvis. A capacious right abdominal small bowel anastomosis seen coronal images 83 through 103 are stable or smaller compared to the CT last year. Associated ventral abdominal rectus muscle diastasis and/or marginal left paramidline hernia on  series 2, image 40 does not appear inflamed. Superimposed sequelae of Whipple procedure are stable. Stomach and gastrojejunostomy are decompressed. No free air or free fluid. Vascular/Lymphatic: Extensive Aortoiliac calcified atherosclerosis. Major arterial structures in the abdomen and pelvis remain patent. No lymphadenopathy identified. Portal venous system appears to be patent. Reproductive: Bladder containing left inguinal hernia redemonstrated (series 2, image 77), 11 cm in length, with a roughly 5 cm segment of herniated urinary bladder along with fat/mesentery in the inguinal canal. Underlying small bladder volume. No active inflammation identified. Other: No pelvic free fluid. Musculoskeletal: No acute or suspicious osseous lesion identified. IMPRESSION: 1. Negative for bowel obstruction. Bowel appears stable status post Whipple related bypass. But fluid-filled distal esophagus is new from last year, query Gastroesophageal Reflux or Esophageal Dysmotility. 2. Progressed generalized Intrahepatic Biliary Ductal Dilatation since last year. But extrahepatic bile ducts seem decompressed. If there is hyperbilirubinemia then repeat MRCP may be valuable when the patient can breath hold. 3. Small/trace Pleural Effusion(s), new from last year and nonspecific. 4. Other stable  sequelae of Whipple. 5.  Aortic Atherosclerosis (ICD10-I70.0). Electronically Signed   By: Genevie Ann M.D.   On: 05/03/2022 10:54    EKG: Independently reviewed. SR with LBBB and QTc of 505  Assessment/Plan Principal Problem:   Abdominal pain with vomiting-elevated LFTs, WBC count and lactic acidosis -COVID and influenza negative -UA is negative -CT scan of the abdomen and pelvis is suggestive of possible esophageal dysmotility but no other abnormalities - Lactic acidosis has resolved - He was admitted in November 2022 with similar episode and was found to have E. coli and Klebsiella bacteremia thought to be secondary to translocation from the gut - I will continue ceftriaxone and metronidazole but will hold off on giving him further doxycycline - Follow-up LFTs and WBC count tomorrow morning and follow-up on blood cultures - As he states his GI symptoms are resolved, I will start a regular diet and see how he tolerates this  Active Problems:      AKI (acute kidney injury  -His creatinine was 1.36 this morning but has since improved to normal -Home meds include valsartan which I will hold for now  History of pancreatic lymphoma status post Whipple's procedure -Resume pancreatic enzymes  Diabetes mellitus type I - I will allow him to continue his insulin pump -I have ordered CBG checks 3 times daily before meals and at bedtime  Prolonged QTc - Will repeat EKG-as his potassium has improved, his QTc may have also improved  Rash on abdomen - This is nonpruritic-as the patient did not notice it on his own, it could have been present prior to coming to the hospital today- Continue to follow    Hyponatremia -The patient is chronically hyponatremic with a sodium in the high 120s at baseline - Sodium was 123 this morning and has improved to 126 after IV fluids were given  Hyperkalemia - Potassium 5.9-has normalized-hold ARB  Obesity Body mass index is 31.01 kg/m.  Obstructive sleep  apnea - Have ordered a CPAP   DVT prophylaxis: Lovenox Code Status: Full code Consults called: None Admission status:  Level of care: Progressive  Debbe Odea MD [facility]    05/03/2022, 6:22 PM

## 2022-05-03 NOTE — ED Notes (Signed)
RT note: Nasal Swab obtained, R Nare, labelled and dropped off at Lab, RN aware.

## 2022-05-03 NOTE — Progress Notes (Signed)
Plan of Care Note for accepted transfer   Patient: Robert Mercer MRN: [id number]   DOA: 05/03/2022  Facility requesting transfer: [facility] Requesting Provider: Dr. Tamera Punt Reason for transfer: Sepsis of unknown source Facility course: 61 yo M w/ PMHx DM, pancreatic B cell lymphoma s/p whipple, HTN, HLD, GERD, OSA. Presenting with abdominal pain, N/V. W/u revealed Na+ 123, K+ 5.9, Alk phos 548, AST 436, ALT 419, T bili 6.6, WBC 23, lactic acid 2.0. Concern for sepsis. He was started on rocephin and flagyl. [facility] was consulted for his elevated LFTs and they will follow.  Plan of care: The patient is accepted for admission to Progressive unit, at [facility]. While holding at [facility], medical decision making responsibilities remain with the [place]. Upon arrival to [facility], [facility] will assume care. Thank you.   Author: Jonnie Finner, DO 05/03/2022  Check [email or web address] for on-call coverage.  Nursing staff, Please call [place] number on Amion as soon as patient's arrival, so appropriate admitting provider can evaluate the pt.

## 2022-05-03 NOTE — ED Notes (Signed)
Called [facility] and spoke to [place]; informed that the Bed Assignment is ready

## 2022-05-04 DIAGNOSIS — E871 Hypo-osmolality and hyponatremia: Secondary | ICD-10-CM | POA: Diagnosis not present

## 2022-05-04 DIAGNOSIS — K8309 Other cholangitis: Secondary | ICD-10-CM | POA: Diagnosis not present

## 2022-05-04 DIAGNOSIS — R7401 Elevation of levels of liver transaminase levels: Secondary | ICD-10-CM | POA: Diagnosis not present

## 2022-05-04 LAB — CBC
HCT: 33.5 % — ABNORMAL LOW (ref 39.0–52.0)
Hemoglobin: 11.6 g/dL — ABNORMAL LOW (ref 13.0–17.0)
MCH: 29 pg (ref 26.0–34.0)
MCHC: 34.6 g/dL (ref 30.0–36.0)
MCV: 83.8 fL (ref 80.0–100.0)
Platelets: 138 10*3/uL — ABNORMAL LOW (ref 150–400)
RBC: 4 MIL/uL — ABNORMAL LOW (ref 4.22–5.81)
RDW: 15.6 % — ABNORMAL HIGH (ref 11.5–15.5)
WBC: 11.4 10*3/uL — ABNORMAL HIGH (ref 4.0–10.5)
nRBC: 0 % (ref 0.0–0.2)

## 2022-05-04 LAB — COMPREHENSIVE METABOLIC PANEL
ALT: 312 U/L — ABNORMAL HIGH (ref 0–44)
AST: 310 U/L — ABNORMAL HIGH (ref 15–41)
Albumin: 2.5 g/dL — ABNORMAL LOW (ref 3.5–5.0)
Alkaline Phosphatase: 373 U/L — ABNORMAL HIGH (ref 38–126)
Anion gap: 8 (ref 5–15)
BUN: 20 mg/dL (ref 8–23)
CO2: 21 mmol/L — ABNORMAL LOW (ref 22–32)
Calcium: 7.5 mg/dL — ABNORMAL LOW (ref 8.9–10.3)
Chloride: 101 mmol/L (ref 98–111)
Creatinine, Ser: 1.05 mg/dL (ref 0.61–1.24)
GFR, Estimated: >60 mL/min (ref >60)
Glucose, Bld: 69 mg/dL — ABNORMAL LOW (ref 70–99)
Potassium: 3.6 mmol/L (ref 3.5–5.1)
Sodium: 130 mmol/L — ABNORMAL LOW (ref 135–145)
Total Bilirubin: 4.1 mg/dL — ABNORMAL HIGH (ref 0.3–1.2)
Total Protein: 5.4 g/dL — ABNORMAL LOW (ref 6.5–8.1)

## 2022-05-04 LAB — GLUCOSE, CAPILLARY
Glucose-Capillary: 57 mg/dL — ABNORMAL LOW (ref 70–99)
Glucose-Capillary: 196 mg/dL — ABNORMAL HIGH (ref 70–99)
Glucose-Capillary: 158 mg/dL — ABNORMAL HIGH (ref 70–99)

## 2022-05-04 LAB — URINE CULTURE: Culture: NO GROWTH

## 2022-05-04 MED ORDER — INSULIN PUMP
Freq: Three times a day (TID) | SUBCUTANEOUS | Status: DC
  Filled 2022-05-04: qty 1

## 2022-05-04 NOTE — Progress Notes (Signed)
Inpatient Diabetes Program Recommendations  AACE/ADA: New Consensus Statement on Inpatient Glycemic Control (2015)  Target Ranges:  Prepandial:   less than 140 mg/dL      Peak postprandial:   less than 180 mg/dL (1-2 hours)      Critically ill patients:  140 - 180 mg/dL   Lab Results  Component Value Date   GLUCAP 57 (L) 05/04/2022   HGBA1C 7.7 (H) 08/24/2016    Review of Glycemic Control  Latest Reference Range & Units 05/04/22 07:51  Glucose-Capillary 70 - 99 mg/dL 57 (L)   Diabetes history: DM 1 since age 89 Outpatient Diabetes medications:  Medtronic insulin pump:  Settings per last Endocrinology visit in October 2023- Current treatment: insulin aspart (Novolog) via Medtronic 670G with Guardian CGM insulin pump settings: Insulin Pump Settings: Basal: Time: Rate: 00:00 0.5 units/hr 03:00 1.0 units/hr 05:30 1.0 units/hr ================== 24-h total basal: 22.5 units  Bolus: ICR: 00:00 15.0 06:00 9.0 11:00 9.0 16:00 9.0 22:00 15.0  ISF: 00:00 45 06:00 40 20:00 45  Target: 00:00 100-120 08:00 90-100 20:00 100-120  AIT: 3:00  Current orders for Inpatient glycemic control:  Insulin pump order set Inpatient Diabetes Program Recommendations:    Spoke with patient at bedside.  He is very knowledgeable of DM management and states that blood sugars are trending well.  I explained that we still need to check blood sugars with hospital meter as ordered (patient is wearing CGM with insulin pump).  He verbalized understanding.  He has supplies at bedside and states that pump is due to be changed out today.  A!C is October was 6.9%-     Thanks,  Adah Perl, RN, BC-ADM Inpatient Diabetes Coordinator Pager [phone]  (8a-5p)

## 2022-05-04 NOTE — Progress Notes (Addendum)
[facility] Progress Note  Patient: Robert Mercer     IZT:[id number]  DOA: 05/03/2022   PCP: Haywood Pao, MD       Brief hospital course: ROLLAND STEINERT is a 61 y.o. 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    Had a similar episode last year and at that time, he grew E. coli and Klebsiella in his bloodstream which was felt to be secondary to translocation from the gut. In the ED, the patient was treated with aggressive IV fluids, ceftriaxone, metronidazole and doxycycline and then sent to [facility] for admission.  Subjective:  He has no abdominal pain nausea or vomiting.  Assessment and Plan: Principal Problem:   Abdominal pain with vomiting-elevated LFTs, leukocytosis and lactic acidosis -COVID and influenza negative -UA is negative -CT scan of the abdomen and pelvis is suggestive of possible esophageal dysmotility and intrahepatic ductal dilatation which is increased from before -Have asked for a GI evaluation - Lactic acidosis has resolved -His symptoms have improved and he is tolerating a regular diet - LFTs appear to be improving - I will continue his antibiotics and follow-up on blood cultures   Active Problems:      AKI  -His creatinine was 1.36 but, with IV fluids, it has improved - As his oral intake has improved, I will stop IV fluids   History of pancreatic lymphoma status post Whipple's procedure -Continue pancreatic enzymes   Diabetes mellitus type I - I will allow him to continue his insulin pump -I have ordered CBG checks 3 times daily before meals and at bedtime   Prolonged QTc - Avoid QT prolonging medications   Rash on abdomen - This is nonpruritic-as the patient did not notice it on his own, it could have been present prior to coming to the hospital  -Appears improved today      Hyponatremia -The patient is chronically hyponatremic with a sodium in the high 120s at baseline - Sodium was 123 and has improved to 130 with IV fluids   Hyperkalemia - Potassium 5.9-has normalized-hold ARB   Obesity Body mass index is 31.01 kg/m.   Obstructive sleep apnea - Have ordered a CPAP     Code Status: Full Code Consultants: GI Level of Care: Level of care: Progressive Total time on patient care: 40 minutes DVT prophylaxis:  enoxaparin (LOVENOX) injection 40 mg Start: 05/03/22 2200     Objective:   Vitals:   05/03/22 1530 05/03/22 1901 05/03/22 2209 05/04/22 0500  BP: (!) 136/54 (!) 148/63 137/60 (!) 139/56  Pulse: 72 76 72 78  Resp: '17 19 18 18  '$ Temp:  98.6 F (37 C) 97.7 F (36.5 C) 98 F (36.7 C)  TempSrc:  Oral Oral Oral  SpO2: 95% 99% 97% 95%  Weight:      Height:       Filed Weights   05/03/22 0913  Weight: 95.3 kg   Exam: General exam: Appears comfortable  HEENT: oral mucosa moist Respiratory system: Clear to auscultation.  Cardiovascular system: S1 & S2 heard  Gastrointestinal system: Abdomen soft, non-tender, nondistended. Normal bowel sounds   Extremities: No cyanosis, clubbing or edema Skin: Mild erythematous rash on left abdomen, numerous excoriations and scratch marks on arms Psychiatry:  Mood & affect appropriate.      CBC: Recent Labs  Lab 05/03/22 0913 05/03/22 1930 05/04/22 0404  WBC  23.0* 15.2* 11.4*  HGB 13.3 12.3* 11.6*  HCT 37.8* 35.8* 33.5*  MCV 83.4 84.4 83.8  PLT 188 146* 361*   Basic Metabolic Panel: Recent Labs  Lab 05/03/22 0913 05/03/22 1404 05/03/22 1930 05/04/22 0404  NA 123* 126*  --  130*  K 5.9* 3.8  --  3.6  CL 89* 94*  --  101  CO2 21* 22  --  21*  GLUCOSE 96 100*  --  69*  BUN 21 19  --  20  CREATININE 1.36* 1.08 0.94 1.05  CALCIUM 8.4* 7.8*  --  7.5*   GFR: Estimated Creatinine Clearance: 83.1 mL/min (by C-G formula based on SCr of 1.05 mg/dL).  Scheduled Meds:  aspirin EC  81 mg Oral  Daily   enoxaparin (LOVENOX) injection  40 mg Subcutaneous Q24H   insulin pump   Subcutaneous TID WC, HS, 0200   lipase/protease/amylase  120,000 Units Oral TID WC   nebivolol  10 mg Oral Daily   pantoprazole  40 mg Oral Daily   Continuous Infusions:  sodium chloride 75 mL/hr at 05/04/22 0906   cefTRIAXone (ROCEPHIN)  IV 2 g (05/04/22 1010)   metronidazole 500 mg (05/04/22 1013)   Imaging and lab data was personally reviewed DG Chest Port 1 View  Result Date: 05/03/2022 CLINICAL DATA:  Possible sepsis, epigastric pain EXAM: PORTABLE CHEST 1 VIEW COMPARISON:  03/21/2021 FINDINGS: Transverse diameter of heart is increased. Increased interstitial markings are seen in both lungs. Similar finding was seen in the previous study. There is no focal consolidation. There is no pleural effusion or pneumothorax. IMPRESSION: Increased interstitial markings are seen in both lungs suggesting scarring or mild interstitial edema or interstitial pneumonia. There is no focal consolidation. There is no pleural effusion or pneumothorax. Electronically Signed   By: Elmer Picker M.D.   On: 05/03/2022 12:35   CT Abdomen Pelvis W Contrast  Result Date: 05/03/2022 CLINICAL DATA:  61 year old male with 2 days of abdominal pain. Epigastric pain. History of pancreatic lymphoma and remote Whipple surgery. EXAM: CT ABDOMEN AND PELVIS WITH CONTRAST TECHNIQUE: Multidetector CT imaging of the abdomen and pelvis was performed using the standard protocol following bolus administration of intravenous contrast. RADIATION DOSE REDUCTION: This exam was performed according to the departmental dose-optimization program which includes automated exposure control, adjustment of the mA and/or kV according to patient size and/or use of iterative reconstruction technique. CONTRAST:  71m OMNIPAQUE IOHEXOL 300 MG/ML  SOLN COMPARISON:  CT Abdomen and Pelvis 03/21/2021 and earlier. Abdomen MRI 04/20/2021. FINDINGS: Lower chest:  Small/trace new layering right pleural effusion. Trace layering left pleural fluid is also new or increased. Stable cardiac size, within normal limits. No pericardial effusion. Mildly dilated and fluid-filled distal esophagus (series 2, image 7), but the GEJ is tapered and the stomach is decompressed (see below). Hepatobiliary: Chronic but increased generalized intrahepatic biliary ductal dilatation (series 2, image 20 versus series 2, image 21 a year ago). Resolved pneumobilia since last year. Otherwise liver enhancement is within normal limits, no discrete liver lesion. Distal extrahepatic bile ducts seem decompressed. Pancreas: Stable surgical absence status post Whipple. Spleen: Stable, negative. Adrenals/Urinary Tract: Stable and negative adrenal glands, kidneys. No hydronephrosis or hydroureter. 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 Stomach/Bowel: Mild motion artifact. Negative large bowel including the appendix (coronal image 60). Decompressed terminal ileum. No acutely dilated small bowel in the abdomen or pelvis. A capacious right abdominal small bowel anastomosis seen coronal images 83 through 103 are stable or smaller  compared to the CT last year. Associated ventral abdominal rectus muscle diastasis and/or marginal left paramidline hernia on series 2, image 40 does not appear inflamed. Superimposed sequelae of Whipple procedure are stable. Stomach and gastrojejunostomy are decompressed. No free air or free fluid. Vascular/Lymphatic: Extensive Aortoiliac calcified atherosclerosis. Major arterial structures in the abdomen and pelvis remain patent. No lymphadenopathy identified. Portal venous system appears to be patent. Reproductive: Bladder containing left inguinal hernia redemonstrated (series 2, image 77), 11 cm in length, with a roughly 5 cm segment of herniated urinary bladder along with fat/mesentery in the inguinal canal. Underlying small bladder volume. No active inflammation  identified. Other: No pelvic free fluid. Musculoskeletal: No acute or suspicious osseous lesion identified. IMPRESSION: 1. Negative for bowel obstruction. Bowel appears stable status post Whipple related bypass. But fluid-filled distal esophagus is new from last year, query Gastroesophageal Reflux or Esophageal Dysmotility. 2. Progressed generalized Intrahepatic Biliary Ductal Dilatation since last year. But extrahepatic bile ducts seem decompressed. If there is hyperbilirubinemia then repeat MRCP may be valuable when the patient can breath hold. 3. Small/trace Pleural Effusion(s), new from last year and nonspecific. 4. Other stable sequelae of Whipple. 5.  Aortic Atherosclerosis (ICD10-I70.0). Electronically Signed   By: Genevie Ann M.D.   On: 05/03/2022 10:54    LOS: 1 day   Author: Debbe Odea  05/04/2022 10:48 AM  To contact [facility]>   Check the care team in [facility] and look for the attending/consulting [place] provider listed  Log into [email or web address] and use [place]'s universal password   Go to> "[facility]"  and find provider  If you still have difficulty reaching the provider, please page the [facility] (Director on Call) for the Hospitalists listed on amion

## 2022-05-04 NOTE — Progress Notes (Signed)
CBG this am 57. Pt is now up to 11 with his own implanted glucometer. He does not want stuck for repeated checks. States his monitor works fine. Will assess. Pt is alert & oriented x 4.& asymptomatic of hypoglycemia. Drinking OJ, & breakfast ordered.

## 2022-05-04 NOTE — Consult Note (Addendum)
Referring Provider: Dr. Tamera Punt, EDP Primary Care Physician:  Tisovec, Fransico Him, MD Primary Gastroenterologist:  Dr. Bryan Lemma  Reason for Consultation:  Abdominal pain and elevated LFTs  HPI: Robert Mercer is a 61 y.o. 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 with abdominal pain.  States that the pain started yesterday but has since resolved.  He had 1 episode of vomiting which was nonbloody.  Currently, he has no abdominal pain or nausea and feels well.  Ate and tolerated breakfast.  Upon evaluation white blood cell count was 23 K, down to 11.4 K this morning.  Hemoglobin was 13.3 g, but down to 11.6 g this morning with hydration.  Appears that his sodium is chronically low, 130 today.  Total bili was 6.6 down to 4.1 today, alk phos 548 down to 373 today, ALT 419 down to 312 today, AST 436 down to 310 today.  CT scan abdomen and pelvis with contrast:  IMPRESSION: 1. Negative for bowel obstruction. Bowel appears stable status post Whipple related bypass. But fluid-filled distal esophagus is new from last year, query Gastroesophageal Reflux or Esophageal Dysmotility.   2. Progressed generalized Intrahepatic Biliary Ductal Dilatation since last year. But extrahepatic bile ducts seem decompressed. If there is hyperbilirubinemia then repeat MRCP may be valuable when the patient can breath hold.   3. Small/trace Pleural Effusion(s), new from last year and nonspecific.   4. Other stable sequelae of Whipple.   5.  Aortic Atherosclerosis (ICD10-I70.0).  Previous GI history per Dr. Vivia Ewing note from 05/16/2021:  History of Pancreatic B Cell Lymphoma s/p Whipple in 2003 (along with chemotherapy and radiation), ventral hernia repair 2018, type 1 diabetes on insulin, OSA, HTN, HLD, GERD. In 2014 had transhepatic biliary stents placed by IR at [facility] for  obstructing benign CBD stricture (biliary enteric anastomosis not accessible endoscopically).   Hospital admission 03/2021 for Klebsiella and E. coli bacteremia and transient pSBO  treated with vancomycin and Zosyn and bowel rest.  No surgery needed.  Inpatient GI team consulted for elevated liver enzymes which were attributed to cholestasis of sepsis and were downtrending at the time of discharge. - 03/21/2021: CT: e/o prior ccy, Whipple.  Pneumobilia but otherwise normal-appearing liver.  Atrophic remaining pancreas without PD dilation.  Focal dilation of the small bowel in the RUQ adjacent to the anastomosis.  Normal-appearing colon.  Small to moderate size left inguinal hernia containing portions of the bladder - RUQ [place] (11/9): ccy, normal-appearing liver with 2.7 mm CBD.  Normal Dopplers - Blood cultures: Klebsiella, E. Coli   - 03/21/2021:   AST/ALT/ALP 623/546/365, T bili 3.1 - 03/22/2021:   AST/ALT/ALP 271/346/203, T bili 4.0 - 03/23/2021: AST/ALT/ALP 131/235/184, T bili 2.8 - 03/25/2021: AST/ALT/ALP 44/113/195,   T bili 1.1 - 04/13/2021:   AST/ALT/ALP 151/318/795, T bili 1.0 - 04/26/2021: AST/ALT/ALP 64/91/404,     T bili 0.5  - 10/03/2018: MRCP: Minimal intrahepatic biliary ductal dilatation with some irregular beading appearance of the left hepatic duct and the common hepatic duct at the hilar confluence.  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  Postsurgical Whipple changes. -04/20/2021: MRCP: Stable mild to moderate intrahepatic biliary ductal dilatation without obstructing lesion.  Postsurgical Whipple changes.   Endoscopic History: - EUS (07/2012, Dr. Paulita Fujita): Small HH, patent Schatzki's ring, moderate LA grade B/C esophagitis.  Prior pancreaticoduodenectomy with healthy anastomosis.  EUS with grossly normal pancreatic remnant.  Intrahepatic biliary dilation. - Colonoscopy (07/2017, Dr. Earlean Shawl): Normal.  Recommended repeat in 5 years due to history of polyps   Past Medical  History:  Diagnosis Date   Arthritis    "left ankle; I broke it in high school" (09/03/2016)   Childhood asthma    Diabetes mellitus without complication (HCC)    Novolog insulin pump.Average fasting blood sugar runs 145   Diabetic retinopathy associated with type 1 diabetes mellitus ([place])    Diffuse large B cell lymphoma (HCC)    Dyslipidemia    GERD (gastroesophageal reflux disease)    takes Omeprazole daily   History of blood transfusion 2003   related to Whipple; developed itching/blotching X 1; transfused later also related to the Whipple; no problems" (09/03/2016)   Hypertension    takes Bystolic and Diovan daily   Joint pain    Nocturia    Pancreatic lymphoma ([place]) 02/14/2002   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 Valere Dross   Sleep apnea    mild to moderate; "couldn't tolerate CPAP; I wear an appliance" (09/03/2016)   Vitamin B12 deficiency    takes B12 every 30 days.   Vitamin D deficiency    takes Vitamin D daily    Past Surgical History:  Procedure Laterality Date   CHOLECYSTECTOMY  2003   COLONOSCOPY  05/2007   ESOPHAGOGASTRODUODENOSCOPY     EUS N/A 07/23/2012   Procedure: FULL UPPER ENDOSCOPIC ULTRASOUND (EUS) RADIAL;  Surgeon: Arta Silence, MD;  Location: [facility];  Service: Endoscopy;  Laterality: N/A;   HERNIA REPAIR     INSERTION OF MESH N/A 09/03/2016   Procedure: INSERTION OF MESH;  Surgeon: Armandina Gemma, MD;  Location: [facility];  Service: General;  Laterality: N/A;   LAPAROSCOPIC INCISIONAL / UMBILICAL / [place]     w/mesh/notes 09/03/2016   percuteaneous puncture  X 3   to place stent in one of my ducts related to scar tissue from [place] N/A 09/03/2016   Procedure: [place];  Surgeon: Armandina Gemma, MD;  Location: [facility];  Service: General;  Laterality: N/A;   WHIPPLE PROCEDURE   2003   pancreatic lymphoma   WISDOM TOOTH EXTRACTION      Prior to Admission medications   Medication Sig Start Date End Date Taking? Authorizing Provider  albuterol (VENTOLIN HFA) 108 (90 Base) MCG/ACT inhaler Inhale 2 puffs into the lungs every 6 (six) hours as needed for wheezing or shortness of breath. 03/25/21  Yes Domenic Polite, MD  aspirin 81 MG tablet Take 81 mg by mouth daily.   Yes [provider]  BYSTOLIC 10 MG tablet Take 10 mg by mouth daily. 08/08/16  Yes [provider]  cyanocobalamin (,VITAMIN B-12,) 1000 MCG/ML injection Inject 1,000 mcg into the muscle every 30 (thirty) days.    Yes [provider]  glucosamine-chondroitin 500-400 MG tablet Take 1 tablet by mouth daily.    Yes [provider]  NOVOLOG 100 UNIT/ML injection Mini med insulin pump 07/06/12  Yes [provider]  Pancrelipase, Lip-Prot-Amyl, (CREON) 24000-76000 units CPEP Take 5-6 capsules (120,000-144,000 Units total) by mouth 3 (three) times daily with meals. Take 3 capsules with snacks. 12/04/21  Yes Cirigliano, Vito V, DO  pantoprazole (PROTONIX) 40 MG tablet Take 40 mg by mouth daily. 04/13/21  Yes [provider]  valsartan (DIOVAN)  80 MG tablet Take 1 tablet (80 mg total) by mouth daily. 03/25/21  Yes Domenic Polite, MD  Vitamin D, Ergocalciferol, (DRISDOL) 50000 UNITS CAPS Take 50,000 Units by mouth every Monday.    Yes [provider]    Current Facility-Administered Medications  Medication Dose Route Frequency Provider Last Rate Last Admin   0.9 %  sodium chloride infusion   Intravenous Continuous Debbe Odea, MD 75 mL/hr at 05/04/22 0906 Infusion Verify at 05/04/22 6333   acetaminophen (TYLENOL) tablet 650 mg  650 mg Oral Q6H PRN Debbe Odea, MD       Or   acetaminophen (TYLENOL) suppository 650 mg  650 mg Rectal Q6H PRN Debbe Odea, MD       aspirin EC tablet 81 mg  81 mg Oral Daily Rizwan, Saima, MD       cefTRIAXone (ROCEPHIN) 2  g in sodium chloride 0.9 % 100 mL IVPB  2 g Intravenous Daily Rizwan, Saima, MD       enoxaparin (LOVENOX) injection 40 mg  40 mg Subcutaneous Q24H Rizwan, Saima, MD       lipase/protease/amylase (CREON) capsule 120,000 Units  120,000 Units Oral TID WC Rizwan, Saima, MD       metroNIDAZOLE (FLAGYL) IVPB 500 mg  500 mg Intravenous BID Debbe Odea, MD   Stopped at 05/03/22 2249   nebivolol (BYSTOLIC) tablet 10 mg  10 mg Oral Daily Rizwan, Eunice Blase, MD       ondansetron (ZOFRAN) tablet 4 mg  4 mg Oral Q6H PRN Debbe Odea, MD       Or   ondansetron (ZOFRAN) injection 4 mg  4 mg Intravenous Q6H PRN Debbe Odea, MD       pantoprazole (PROTONIX) EC tablet 40 mg  40 mg Oral Daily Debbe Odea, MD        Allergies as of 05/03/2022 - Review Complete 05/03/2022  Allergen Reaction Noted   Azilsartan Other (See Comments) 05/27/2016   Chlorthalidone Other (See Comments) 05/10/2016   Ezetimibe Other (See Comments) 05/03/2022   Shellfish allergy  07/23/2012   Statins Other (See Comments) 05/03/2022   Amlodipine Hives, Itching, and Rash 11/19/2014   Indapamide Rash 11/22/2016   Lisinopril Hives, Itching, and Rash 11/19/2014    Family History  Problem Relation Age of Onset   Colon polyps Maternal Grandfather    Colon cancer Neg Hx    Rectal cancer Neg Hx     Social History   Socioeconomic History   Marital status: Single    Spouse name: Not on file   Number of children: Not on file   Years of education: Not on file   Highest education level: Not on file  Occupational History   Not on file  Tobacco Use   Smoking status: Never   Smokeless tobacco: Never  Vaping Use   Vaping Use: Never used  Substance and Sexual Activity   Alcohol use: Yes    Alcohol/week: 6.0 standard drinks of alcohol    Types: 6 Cans of beer per week   Drug use: No   Sexual activity: Not Currently  Other Topics Concern   Not on file  Social History Narrative   Not on file   Social Determinants of Health    Financial Resource Strain: Not on file  Food Insecurity: Unknown (05/03/2022)   Hunger Vital Sign    Worried About Running Out of Food in the Last Year: Patient refused    [place] in the Last Year: Patient  refused  Transportation Needs: Unknown (05/03/2022)   PRAPARE - Hydrologist (Medical): Patient refused    Lack of Transportation (Non-Medical): Patient refused  Physical Activity: Not on file  Stress: Not on file  Social Connections: Not on file  Intimate Partner Violence: Unknown (05/03/2022)   Humiliation, Afraid, Rape, and Kick questionnaire    Fear of Current or Ex-Partner: Patient refused    Emotionally Abused: Patient refused    Physically Abused: Patient refused    Sexually Abused: Patient refused    Review of Systems: ROS is O/W negative except as mentioned in HPI.  Physical Exam: Vital signs in last 24 hours: Temp:  [97.7 F (36.5 C)-98.6 F (37 C)] 98 F (36.7 C) (12/22 0500) Pulse Rate:  [72-78] 78 (12/22 0500) Resp:  [16-19] 18 (12/22 0500) BP: (125-149)/(51-63) 139/56 (12/22 0500) SpO2:  [93 %-99 %] 95 % (12/22 0500)   General:  Alert, Well-developed, well-nourished, pleasant and cooperative in NAD Head:  Normocephalic and atraumatic. Eyes:  Sclera clear, no icterus.  Conjunctiva pink. Ears:  Normal auditory acuity. Mouth:  No deformity or lesions.   Lungs:  Clear throughout to auscultation.  No wheezes, crackles, or rhonchi.  Heart:  Regular rate and rhythm; no murmurs, clicks, rubs, or gallops. Abdomen:  Soft, non-distended.  BS present.  Non-tender.  Scar noted from previous surgery with some erythematous changes.   Msk:  Symmetrical without gross deformities. Pulses:  Normal pulses noted. Extremities:  Without clubbing or edema.  Scratch marks noted on upper extremities. Neurologic:  Alert and oriented x 4;  grossly normal neurologically. Skin:  Intact without significant lesions or rashes. Psych:  Alert and  cooperative. Normal mood and affect.  Intake/Output from previous day: 12/21 0701 - 12/22 0700 In: 2400.4 [P.O.:240; I.V.:960; IV Piggyback:1200.4] Out: 500 [Urine:500] Intake/Output this shift: Total I/O In: 855.7 [I.V.:755.7; IV Piggyback:100] Out: -   Lab Results: Recent Labs    05/03/22 0913 05/03/22 1930 05/04/22 0404  WBC 23.0* 15.2* 11.4*  HGB 13.3 12.3* 11.6*  HCT 37.8* 35.8* 33.5*  PLT 188 146* 138*   BMET Recent Labs    05/03/22 0913 05/03/22 1404 05/03/22 1930 05/04/22 0404  NA 123* 126*  --  130*  K 5.9* 3.8  --  3.6  CL 89* 94*  --  101  CO2 21* 22  --  21*  GLUCOSE 96 100*  --  69*  BUN 21 19  --  20  CREATININE 1.36* 1.08 0.94 1.05  CALCIUM 8.4* 7.8*  --  7.5*   LFT Recent Labs    05/04/22 0404  PROT 5.4*  ALBUMIN 2.5*  AST 310*  ALT 312*  ALKPHOS 373*  BILITOT 4.1*   Studies/Results: DG Chest Port 1 View  Result Date: 05/03/2022 CLINICAL DATA:  Possible sepsis, epigastric pain EXAM: PORTABLE CHEST 1 VIEW COMPARISON:  03/21/2021 FINDINGS: Transverse diameter of heart is increased. Increased interstitial markings are seen in both lungs. Similar finding was seen in the previous study. There is no focal consolidation. There is no pleural effusion or pneumothorax. IMPRESSION: Increased interstitial markings are seen in both lungs suggesting scarring or mild interstitial edema or interstitial pneumonia. There is no focal consolidation. There is no pleural effusion or pneumothorax. Electronically Signed   By: Elmer Picker M.D.   On: 05/03/2022 12:35   CT Abdomen Pelvis W Contrast  Result Date: 05/03/2022 CLINICAL DATA:  61 year old male with 2 days of abdominal pain. Epigastric pain. History of pancreatic  lymphoma and remote Whipple surgery. EXAM: CT ABDOMEN AND PELVIS WITH CONTRAST TECHNIQUE: Multidetector CT imaging of the abdomen and pelvis was performed using the standard protocol following bolus administration of intravenous contrast.  RADIATION DOSE REDUCTION: This exam was performed according to the departmental dose-optimization program which includes automated exposure control, adjustment of the mA and/or kV according to patient size and/or use of iterative reconstruction technique. CONTRAST:  9m OMNIPAQUE IOHEXOL 300 MG/ML  SOLN COMPARISON:  CT Abdomen and Pelvis 03/21/2021 and earlier. Abdomen MRI 04/20/2021. FINDINGS: Lower chest: Small/trace new layering right pleural effusion. Trace layering left pleural fluid is also new or increased. Stable cardiac size, within normal limits. No pericardial effusion. Mildly dilated and fluid-filled distal esophagus (series 2, image 7), but the GEJ is tapered and the stomach is decompressed (see below). Hepatobiliary: Chronic but increased generalized intrahepatic biliary ductal dilatation (series 2, image 20 versus series 2, image 21 a year ago). Resolved pneumobilia since last year. Otherwise liver enhancement is within normal limits, no discrete liver lesion. Distal extrahepatic bile ducts seem decompressed. Pancreas: Stable surgical absence status post Whipple. Spleen: Stable, negative. Adrenals/Urinary Tract: Stable and negative adrenal glands, kidneys. No hydronephrosis or hydroureter. 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 Stomach/Bowel: Mild motion artifact. Negative large bowel including the appendix (coronal image 60). Decompressed terminal ileum. No acutely dilated small bowel in the abdomen or pelvis. A capacious right abdominal small bowel anastomosis seen coronal images 83 through 103 are stable or smaller compared to the CT last year. Associated ventral abdominal rectus muscle diastasis and/or marginal left paramidline hernia on series 2, image 40 does not appear inflamed. Superimposed sequelae of Whipple procedure are stable. Stomach and gastrojejunostomy are decompressed. No free air or free fluid. Vascular/Lymphatic: Extensive Aortoiliac calcified atherosclerosis.  Major arterial structures in the abdomen and pelvis remain patent. No lymphadenopathy identified. Portal venous system appears to be patent. Reproductive: Bladder containing left inguinal hernia redemonstrated (series 2, image 77), 11 cm in length, with a roughly 5 cm segment of herniated urinary bladder along with fat/mesentery in the inguinal canal. Underlying small bladder volume. No active inflammation identified. Other: No pelvic free fluid. Musculoskeletal: No acute or suspicious osseous lesion identified. IMPRESSION: 1. Negative for bowel obstruction. Bowel appears stable status post Whipple related bypass. But fluid-filled distal esophagus is new from last year, query Gastroesophageal Reflux or Esophageal Dysmotility. 2. Progressed generalized Intrahepatic Biliary Ductal Dilatation since last year. But extrahepatic bile ducts seem decompressed. If there is hyperbilirubinemia then repeat MRCP may be valuable when the patient can breath hold. 3. Small/trace Pleural Effusion(s), new from last year and nonspecific. 4. Other stable sequelae of Whipple. 5.  Aortic Atherosclerosis (ICD10-I70.0). Electronically Signed   By: HGenevie AnnM.D.   On: 05/03/2022 10:54    IMPRESSION:  *61year old male presenting with epigastric abdominal pain with elevated LFTs, total bili up to 6.6.  Had an episode of vomiting as well.  Some progressive intrahepatic biliary duct dilatation compared to imaging from last year.  At this point patient's symptoms have resolved, he feels well, LFTs are downtrending.  Question if he could have passed a gallstone. *History of Pancreatic Lymphoma s/p Whipple 2003, chemotherapy, radiation: On pancreatic enzymes in the form of Creon.  PLAN: -Suspect no further evaluation needed for now.  If things continue to improve tomorrow then he is okay for discharge.  Trend LFTs.  Doubt need for MRCP at this point.   JLaban Emperor Zehr  05/04/2022, 9:15 AM  Attending physician's note   I have taken  history, reviewed the chart and examined the patient. I performed a substantive portion of this encounter, including complete performance of at least one of the key components, in conjunction with the APP. I agree with the Advanced Practitioner's note, impression and recommendations.   Recurrent ascending cholangitis- responding well to A/Bs. Likely passed stone given resolution of abdo pain, downtrending LFTs. Neg BC. Feels much better and wants to go home in AM.  H/O pancreatic lymphoma s/p Whipple's 2003.  Plan: -MRI/MRCP today (if possible, otherwise as outpt) -Needs A/Bs for 10 days (can switch to oral in AM, levaquin 565m QD) -Check CA 19-9, CEA -Trend LFTs, CBC -If MRI is +ve[facility] -If neg, needs EUS at [facility] for eval of bile duct. -D/W pt and family in detail -Will sign off for now.  Will follow MRI results -FU with Dr CBryan Lemmaas outpatient as needed.   RCarmell Austria MD LVelora HecklerGI [phone]

## 2022-05-05 ENCOUNTER — Inpatient Hospital Stay (HOSPITAL_COMMUNITY): Payer: BC Managed Care – PPO

## 2022-05-05 DIAGNOSIS — K7460 Unspecified cirrhosis of liver: Secondary | ICD-10-CM

## 2022-05-05 DIAGNOSIS — E871 Hypo-osmolality and hyponatremia: Secondary | ICD-10-CM | POA: Diagnosis not present

## 2022-05-05 DIAGNOSIS — E109 Type 1 diabetes mellitus without complications: Secondary | ICD-10-CM

## 2022-05-05 DIAGNOSIS — K750 Abscess of liver: Secondary | ICD-10-CM

## 2022-05-05 DIAGNOSIS — R7401 Elevation of levels of liver transaminase levels: Secondary | ICD-10-CM | POA: Diagnosis not present

## 2022-05-05 DIAGNOSIS — K8309 Other cholangitis: Secondary | ICD-10-CM | POA: Diagnosis not present

## 2022-05-05 DIAGNOSIS — R1084 Generalized abdominal pain: Secondary | ICD-10-CM | POA: Diagnosis not present

## 2022-05-05 LAB — CBC
HCT: 33.9 % — ABNORMAL LOW (ref 39.0–52.0)
Hemoglobin: 11.7 g/dL — ABNORMAL LOW (ref 13.0–17.0)
MCH: 29 pg (ref 26.0–34.0)
MCHC: 34.5 g/dL (ref 30.0–36.0)
MCV: 83.9 fL (ref 80.0–100.0)
Platelets: 139 10*3/uL — ABNORMAL LOW (ref 150–400)
RBC: 4.04 MIL/uL — ABNORMAL LOW (ref 4.22–5.81)
RDW: 15.4 % (ref 11.5–15.5)
WBC: 10.4 10*3/uL (ref 4.0–10.5)
nRBC: 0 % (ref 0.0–0.2)

## 2022-05-05 LAB — COMPREHENSIVE METABOLIC PANEL
ALT: 233 U/L — ABNORMAL HIGH (ref 0–44)
AST: 195 U/L — ABNORMAL HIGH (ref 15–41)
Albumin: 2.3 g/dL — ABNORMAL LOW (ref 3.5–5.0)
Alkaline Phosphatase: 448 U/L — ABNORMAL HIGH (ref 38–126)
Anion gap: 8 (ref 5–15)
BUN: 16 mg/dL (ref 8–23)
CO2: 21 mmol/L — ABNORMAL LOW (ref 22–32)
Calcium: 8 mg/dL — ABNORMAL LOW (ref 8.9–10.3)
Chloride: 101 mmol/L (ref 98–111)
Creatinine, Ser: 0.98 mg/dL (ref 0.61–1.24)
GFR, Estimated: >60 mL/min (ref >60)
Glucose, Bld: 54 mg/dL — ABNORMAL LOW (ref 70–99)
Potassium: 3.7 mmol/L (ref 3.5–5.1)
Sodium: 130 mmol/L — ABNORMAL LOW (ref 135–145)
Total Bilirubin: 3.5 mg/dL — ABNORMAL HIGH (ref 0.3–1.2)
Total Protein: 5.8 g/dL — ABNORMAL LOW (ref 6.5–8.1)

## 2022-05-05 LAB — GLUCOSE, CAPILLARY
Glucose-Capillary: 106 mg/dL — ABNORMAL HIGH (ref 70–99)
Glucose-Capillary: 60 mg/dL — ABNORMAL LOW (ref 70–99)
Glucose-Capillary: 98 mg/dL (ref 70–99)
Glucose-Capillary: 37 mg/dL — CL (ref 70–99)
Glucose-Capillary: 61 mg/dL — ABNORMAL LOW (ref 70–99)

## 2022-05-05 LAB — CEA: CEA: 2.8 ng/mL (ref 0.0–4.7)

## 2022-05-05 LAB — CANCER ANTIGEN 19-9: CA 19-9: 102 U/mL — ABNORMAL HIGH (ref 0–35)

## 2022-05-05 MED ORDER — CEFDINIR 300 MG PO CAPS: 300.0000 mg | ORAL_CAPSULE | Freq: Two times a day (BID) | ORAL | 0 refills | Status: AC

## 2022-05-05 MED ORDER — DEXTROSE 50 % IV SOLN
12.5000 g | INTRAVENOUS | Status: AC
  Administered 2022-05-05: 12.5 g via INTRAVENOUS
  Filled 2022-05-05: qty 50

## 2022-05-05 MED ORDER — DEXTROSE 50 % IV SOLN
50.0000 mL | Freq: Once | INTRAVENOUS | Status: AC
  Administered 2022-05-05: 50 mL via INTRAVENOUS
  Filled 2022-05-05: qty 50

## 2022-05-05 MED ORDER — GADOBUTROL 1 MMOL/ML IV SOLN
10.0000 mL | Freq: Once | INTRAVENOUS | Status: AC | PRN
  Administered 2022-05-05: 10 mL via INTRAVENOUS

## 2022-05-05 MED ORDER — METRONIDAZOLE 500 MG PO TABS: 500.0000 mg | ORAL_TABLET | Freq: Two times a day (BID) | ORAL | 0 refills | Status: AC

## 2022-05-05 MED ORDER — CEFDINIR 300 MG PO CAPS: 300.0000 mg | ORAL_CAPSULE | Freq: Two times a day (BID) | ORAL | 0 refills | Status: DC

## 2022-05-05 MED ORDER — METRONIDAZOLE 500 MG PO TABS: 500.0000 mg | ORAL_TABLET | Freq: Three times a day (TID) | ORAL | 0 refills | Status: DC

## 2022-05-05 NOTE — Progress Notes (Signed)
Pt returned from MRI Glucose 37. No diet order, requested diet to be resumed by attending.  Giving pt apple juice currently, will recheck

## 2022-05-05 NOTE — Progress Notes (Signed)
Hypoglycemic Event  CBG: 60  Treatment: D50 50 mL (25 gm)  Symptoms: None  Follow-up CBG: Time:0425 CBG Result:98  Possible Reasons for Event: Other: DM1   Robert Mercer Robert Mercer

## 2022-05-05 NOTE — Progress Notes (Addendum)
Progress Note    ASSESSMENT AND PLAN:   Recurrent ascending cholangitis with possible small liver abscesses.  H/O pancreatic lymphoma s/p Whipple's 2003.  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  No obvious CBD stones on MRCP. No recurrent lymphoma.  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  Can switch him to oral Levaquin 500 mg p.o. daily at discharge -Add AFP -Follow-up at  [facility] for possible EUS with +/- ERCP/liver bx. (He has personal contacts at  [facility] and will take care of appt, if any problems, he will get in touch with [place]) -Follow results of CA 19-9, CEA -Rpt CBC, LFTs next week -FU LBGI in 2-3 weeks. -Will sign off for now. -D/W pt in detail.  He also has MRI report.      SUBJECTIVE   Feels much better No abdominal pain No fever or chills Wants to go home MRI discussed in detail WBC count trending down well.    OBJECTIVE:     Vital signs in last 24 hours: Temp:  [97.7 F (36.5 C)-99.5 F (37.5 C)] 98.9 F (37.2 C) (12/23 0600) Pulse Rate:  [64-72] 72 (12/23 0600) Resp:  [18-21] 20 (12/23 0600) BP: (142-158)/(60-67) 158/60 (12/23 0600) SpO2:  [94 %-96 %] 94 % (12/23 0600)   General:   Alert, well-developed male in NAD EENT:  Normal hearing, non icteric sclera, conjunctive pink.  Heart:  Regular rate and rhythm; no murmur.  No lower extremity edema   Pulm: Normal respiratory effort, lungs CTA bilaterally without wheezes or crackles. Abdomen:  Soft, nondistended, nontender.  Normal bowel sounds,.       Neurologic:  Alert and  oriented x4;  grossly normal neurologically. Psych:  Pleasant, cooperative.  Normal mood and affect.   Intake/Output from previous day: 12/22 0701 - 12/23 0700 In: 1515.7 [P.O.:360; I.V.:755.7; IV Piggyback:400] Out: 350 [Urine:350] Intake/Output this shift: No intake/output data recorded.  Lab Results: Recent Labs     05/03/22 1930 05/04/22 0404 05/05/22 0522  WBC 15.2* 11.4* 10.4  HGB 12.3* 11.6* 11.7*  HCT 35.8* 33.5* 33.9*  PLT 146* 138* 139*   BMET Recent Labs    05/03/22 1404 05/03/22 1930 05/04/22 0404 05/05/22 0522  NA 126*  --  130* 130*  K 3.8  --  3.6 3.7  CL 94*  --  101 101  CO2 22  --  21* 21*  GLUCOSE 100*  --  69* 54*  BUN 19  --  20 16  CREATININE 1.08 0.94 1.05 0.98  CALCIUM 7.8*  --  7.5* 8.0*   LFT Recent Labs    05/05/22 0522  PROT 5.8*  ALBUMIN 2.3*  AST 195*  ALT 233*  ALKPHOS 448*  BILITOT 3.5*   PT/INR No results for input(s): "LABPROT", "INR" in the last 72 hours. Hepatitis Panel No results for input(s): "HEPBSAG", "HCVAB", "HEPAIGM", "HEPBIGM" in the last 72 hours.  MR ABDOMEN MRCP W WO CONTAST  Result Date: 05/05/2022 CLINICAL DATA:  Jaundice.  History of lymphoma. EXAM: MRI ABDOMEN WITHOUT AND WITH CONTRAST (INCLUDING MRCP) TECHNIQUE: Multiplanar multisequence MR imaging of the abdomen was performed both before and after the administration of intravenous contrast. Heavily T2-weighted images of the biliary and pancreatic ducts were obtained, and three-dimensional MRCP images were rendered by post processing. CONTRAST:  32m GADAVIST GADOBUTROL 1 MMOL/ML IV SOLN COMPARISON:  05/03/2022 FINDINGS: Lower chest: Bilateral pleural effusions with overlying atelectasis noted. Hepatobiliary: Indeterminate.  The liver has a slightly nodular contour. Early arterial phase enhancement is heterogeneous with relative increased enhancement within the left lobe compared with the right. Several arterial phase enhancing foci without corresponding T2 abnormality identified. These become isointense to liver on the portal venous and delayed phase images without washout. Lesions include: -0.8 cm area of arterial phase enhancement in the anterior right lobe, image 30/15. -Adjacent area of enhancement measures 1.1 cm, image 30/15. These appear isointense on the portal venous and  delayed phase images -area within the periphery of the right lobe of liver measures 1.9 cm, image 21/15. There are several peripheral enhancing foci identified within lateral segment of left lobe of liver with corresponding increased T2 signal, including - anterior, subcapsular T2 hyperintense lesion which exhibits peripheral enhancement measuring 1.2 cm, image 27/19. -adjacent lesion measures 0.6 cm, image 25/15. Moderate intrahepatic bile duct dilatation is again identified and appears unchanged from 05/03/2022. Increased T2 signal is noted within the lateral segment of left lobe concerning for edema. MRCP images are suboptimal secondary to motion artifact. Postoperative changes from Whipple procedure identified. Pancreas: There is atrophy of the body and tail of pancreas. No inflammation or mass identified. Spleen:  Within normal limits in size and appearance. Adrenals/Urinary Tract: Normal adrenal glands. Kidneys are unremarkable. Stomach/Bowel: Stomach is unremarkable. Status post Whipple procedure. No bowel wall thickening or inflammation. Unchanged focally dilated loop of small bowel within the ventral abdomen which measures up to 3.6 cm, image 76/19. Vascular/Lymphatic: Normal appearance of the scratch set aortic atherosclerosis without aneurysm. Upper abdominal vascularity including the portal vein are patent. No adenopathy identified. Other: Trace edema/fluid within the retroperitoneum and central mesentery. No discrete fluid collections. Right posterior body wall edema noted suggesting anasarca. Musculoskeletal: No suspicious bone lesions identified. IMPRESSION: 1. Morphologic features of the liver suggestive of early cirrhosis. 2. Asymmetric arterial phase hyperenhancement of the left lobe of liver is identified. Although nonspecific in the setting of chronic liver disease, findings may reflect hyperemia due to underlying inflammation/infection. Within this area there are several T2 hyperintense,  peripherally enhancing foci, which were not seen on previous imaging. In the acute setting these may represent foci of small abscesses. Correlate for any clinical signs/symptoms of cholangitis. 3. There are several arterial phase enhancing foci without corresponding T2 abnormality. These become isointense to liver on the portal venous and delayed phase images without washout. These are indeterminate. In the setting of cirrhosis these may represent areas of regenerate of nodularity. Follow-up imaging in 3 months with repeat MRI of the abdomen is advised to confirm persistence. 4. Moderate intrahepatic bile duct dilatation is again identified and appears unchanged from 05/03/2022. Cannot exclude underlying stricture at the choledochojejunostomy site. MRCP images are suboptimal secondary to motion artifact. 5. Status post Whipple procedure. 6. Bilateral pleural effusions with overlying atelectasis. 7. Right posterior body wall edema noted suggesting anasarca. Electronically Signed   By: Kerby Moors M.D.   On: 05/05/2022 09:22   MR 3D Recon At Scanner  Result Date: 05/05/2022 CLINICAL DATA:  Jaundice.  History of lymphoma. EXAM: MRI ABDOMEN WITHOUT AND WITH CONTRAST (INCLUDING MRCP) TECHNIQUE: Multiplanar multisequence MR imaging of the abdomen was performed both before and after the administration of intravenous contrast. Heavily T2-weighted images of the biliary and pancreatic ducts were obtained, and three-dimensional MRCP images were rendered by post processing. CONTRAST:  107m GADAVIST GADOBUTROL 1 MMOL/ML IV SOLN COMPARISON:  05/03/2022 FINDINGS: Lower chest: Bilateral pleural effusions with overlying atelectasis noted. Hepatobiliary: Indeterminate. The liver has a  slightly nodular contour. Early arterial phase enhancement is heterogeneous with relative increased enhancement within the left lobe compared with the right. Several arterial phase enhancing foci without corresponding T2 abnormality identified.  These become isointense to liver on the portal venous and delayed phase images without washout. Lesions include: -0.8 cm area of arterial phase enhancement in the anterior right lobe, image 30/15. -Adjacent area of enhancement measures 1.1 cm, image 30/15. These appear isointense on the portal venous and delayed phase images -area within the periphery of the right lobe of liver measures 1.9 cm, image 21/15. There are several peripheral enhancing foci identified within lateral segment of left lobe of liver with corresponding increased T2 signal, including - anterior, subcapsular T2 hyperintense lesion which exhibits peripheral enhancement measuring 1.2 cm, image 27/19. -adjacent lesion measures 0.6 cm, image 25/15. Moderate intrahepatic bile duct dilatation is again identified and appears unchanged from 05/03/2022. Increased T2 signal is noted within the lateral segment of left lobe concerning for edema. MRCP images are suboptimal secondary to motion artifact. Postoperative changes from Whipple procedure identified. Pancreas: There is atrophy of the body and tail of pancreas. No inflammation or mass identified. Spleen:  Within normal limits in size and appearance. Adrenals/Urinary Tract: Normal adrenal glands. Kidneys are unremarkable. Stomach/Bowel: Stomach is unremarkable. Status post Whipple procedure. No bowel wall thickening or inflammation. Unchanged focally dilated loop of small bowel within the ventral abdomen which measures up to 3.6 cm, image 76/19. Vascular/Lymphatic: Normal appearance of the scratch set aortic atherosclerosis without aneurysm. Upper abdominal vascularity including the portal vein are patent. No adenopathy identified. Other: Trace edema/fluid within the retroperitoneum and central mesentery. No discrete fluid collections. Right posterior body wall edema noted suggesting anasarca. Musculoskeletal: No suspicious bone lesions identified. IMPRESSION: 1. Morphologic features of the liver  suggestive of early cirrhosis. 2. Asymmetric arterial phase hyperenhancement of the left lobe of liver is identified. Although nonspecific in the setting of chronic liver disease, findings may reflect hyperemia due to underlying inflammation/infection. Within this area there are several T2 hyperintense, peripherally enhancing foci, which were not seen on previous imaging. In the acute setting these may represent foci of small abscesses. Correlate for any clinical signs/symptoms of cholangitis. 3. There are several arterial phase enhancing foci without corresponding T2 abnormality. These become isointense to liver on the portal venous and delayed phase images without washout. These are indeterminate. In the setting of cirrhosis these may represent areas of regenerate of nodularity. Follow-up imaging in 3 months with repeat MRI of the abdomen is advised to confirm persistence. 4. Moderate intrahepatic bile duct dilatation is again identified and appears unchanged from 05/03/2022. Cannot exclude underlying stricture at the choledochojejunostomy site. MRCP images are suboptimal secondary to motion artifact. 5. Status post Whipple procedure. 6. Bilateral pleural effusions with overlying atelectasis. 7. Right posterior body wall edema noted suggesting anasarca. Electronically Signed   By: Kerby Moors M.D.   On: 05/05/2022 09:22   DG Chest Port 1 View  Result Date: 05/03/2022 CLINICAL DATA:  Possible sepsis, epigastric pain EXAM: PORTABLE CHEST 1 VIEW COMPARISON:  03/21/2021 FINDINGS: Transverse diameter of heart is increased. Increased interstitial markings are seen in both lungs. Similar finding was seen in the previous study. There is no focal consolidation. There is no pleural effusion or pneumothorax. IMPRESSION: Increased interstitial markings are seen in both lungs suggesting scarring or mild interstitial edema or interstitial pneumonia. There is no focal consolidation. There is no pleural effusion or  pneumothorax. Electronically Signed   By: Royston Cowper  Rathinasamy M.D.   On: 05/03/2022 12:35   CT Abdomen Pelvis W Contrast  Result Date: 05/03/2022 CLINICAL DATA:  61 year old male with 2 days of abdominal pain. Epigastric pain. History of pancreatic lymphoma and remote Whipple surgery. EXAM: CT ABDOMEN AND PELVIS WITH CONTRAST TECHNIQUE: Multidetector CT imaging of the abdomen and pelvis was performed using the standard protocol following bolus administration of intravenous contrast. RADIATION DOSE REDUCTION: This exam was performed according to the departmental dose-optimization program which includes automated exposure control, adjustment of the mA and/or kV according to patient size and/or use of iterative reconstruction technique. CONTRAST:  74m OMNIPAQUE IOHEXOL 300 MG/ML  SOLN COMPARISON:  CT Abdomen and Pelvis 03/21/2021 and earlier. Abdomen MRI 04/20/2021. FINDINGS: Lower chest: Small/trace new layering right pleural effusion. Trace layering left pleural fluid is also new or increased. Stable cardiac size, within normal limits. No pericardial effusion. Mildly dilated and fluid-filled distal esophagus (series 2, image 7), but the GEJ is tapered and the stomach is decompressed (see below). Hepatobiliary: Chronic but increased generalized intrahepatic biliary ductal dilatation (series 2, image 20 versus series 2, image 21 a year ago). Resolved pneumobilia since last year. Otherwise liver enhancement is within normal limits, no discrete liver lesion. Distal extrahepatic bile ducts seem decompressed. Pancreas: Stable surgical absence status post Whipple. Spleen: Stable, negative. Adrenals/Urinary Tract: Stable and negative adrenal glands, kidneys. No hydronephrosis or hydroureter. 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 Stomach/Bowel: Mild motion artifact. Negative large bowel including the appendix (coronal image 60). Decompressed terminal ileum. No acutely dilated small bowel in the abdomen  or pelvis. A capacious right abdominal small bowel anastomosis seen coronal images 83 through 103 are stable or smaller compared to the CT last year. Associated ventral abdominal rectus muscle diastasis and/or marginal left paramidline hernia on series 2, image 40 does not appear inflamed. Superimposed sequelae of Whipple procedure are stable. Stomach and gastrojejunostomy are decompressed. No free air or free fluid. Vascular/Lymphatic: Extensive Aortoiliac calcified atherosclerosis. Major arterial structures in the abdomen and pelvis remain patent. No lymphadenopathy identified. Portal venous system appears to be patent. Reproductive: Bladder containing left inguinal hernia redemonstrated (series 2, image 77), 11 cm in length, with a roughly 5 cm segment of herniated urinary bladder along with fat/mesentery in the inguinal canal. Underlying small bladder volume. No active inflammation identified. Other: No pelvic free fluid. Musculoskeletal: No acute or suspicious osseous lesion identified. IMPRESSION: 1. Negative for bowel obstruction. Bowel appears stable status post Whipple related bypass. But fluid-filled distal esophagus is new from last year, query Gastroesophageal Reflux or Esophageal Dysmotility. 2. Progressed generalized Intrahepatic Biliary Ductal Dilatation since last year. But extrahepatic bile ducts seem decompressed. If there is hyperbilirubinemia then repeat MRCP may be valuable when the patient can breath hold. 3. Small/trace Pleural Effusion(s), new from last year and nonspecific. 4. Other stable sequelae of Whipple. 5.  Aortic Atherosclerosis (ICD10-I70.0). Electronically Signed   By: HGenevie AnnM.D.   On: 05/03/2022 10:54     Principal Problem:   Abdominal pain Active Problems:   OBSTRUCTIVE SLEEP APNEA   Liver enzyme elevation   [place]   Hyponatremia   Diabetes mellitus type 1 ([place]   H/O Whipple procedure   LBBB (left bundle branch block)   Vomiting      LOS: 2 days     RCarmell Austria MD 05/05/2022, 10:22 AM LVelora HecklerGI ([phone]

## 2022-05-05 NOTE — Progress Notes (Signed)
Discharge instructions provided. Pt verbalized understanding of all information, & having no further questions. Pt was transported to brother's car for transportation home, via wheel chair, and asymptomatic of any s/s of distress.

## 2022-05-05 NOTE — Progress Notes (Signed)
Hypoglycemic Event  CBG: 63 ( per pt insulin pump implant)  Treatment: D50 25 mL (12.5 gm)  Symptoms: None  Follow-up CBG: Time:0300 CBG Result:106  Possible Reasons for Event: Inadequate meal intake  Comments/MD notified: NP Zebedee Iba made aware   Mona Ayars C Hadar Elgersma

## 2022-05-05 NOTE — Discharge Summary (Addendum)
Physician Discharge Summary  [name] VOP:[id number] DOB: 1960-12-23 DOA: 05/03/2022  PCP: Haywood Pao, MD  Admit date: 05/03/2022 Discharge date: 05/05/2022 Discharging to: home Recommendations for Outpatient Follow-up:  Will need GI f/u in 1 wk  Consults:  GI Procedures:  MRCP   Discharge Diagnoses:   Principal Problem:   Hepatic abscesses Active Problems:   Liver enzyme elevation   AKI (acute kidney injury) ([place])   Hyponatremia   Cirrhosis of liver ([place])   OBSTRUCTIVE SLEEP APNEA   Diabetes mellitus type 1 ([place])   H/O Whipple procedure   LBBB (left bundle branch block)     Hospital Course:  Robert Mercer is a 61 y.o. 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    Had a similar episode last year and at that time, he grew E. coli and Klebsiella in his bloodstream which was felt to be secondary to translocation from the gut. In the ED, the patient was treated with aggressive IV fluids, ceftriaxone, metronidazole and doxycycline and then sent to [facility] for admission.  Principal Problem:   Abdominal pain with vomiting-elevated LFTs, leukocytosis and lactic acidosis -COVID and influenza negative -UA is negative -CT scan of the abdomen and pelvis is suggestive of possible esophageal dysmotility and intrahepatic ductal dilatation which is increased from before - Lactic acidosis has resolved -His symptoms have improved and he is tolerating a regular diet - LFTs appear to be improving - blood cultures have remained negative - MRCP reveals increased hepatic duct dilatation and possible small hepatic abscesses- appreciate GI eval- recommendations are to continue antibiotics for 2 more weeks and f/u with GI at [facility] for possible EUS/ ERCP - will dc with Omnicef and Flagyl - unable to tolerate Fluoroquinolones due to  prolonged QT   Active Problems:      AKI - prerenal -His creatinine was 1.36 but, with IV fluids, it has improved to normal   History of pancreatic lymphoma status post Whipple's procedure -Continue pancreatic enzymes   Diabetes mellitus type I - cont Insulin pump- He had some hypoglycemia today which have resolved- this does not occur at home   Prolonged QTc - Avoid QT prolonging medications    Rash on abdomen - This is nonpruritic-as the patient did not notice it on his own, it could have been present prior to coming to the hospital  -Appears improved       Hyponatremia -The patient is chronically hyponatremic with a sodium in the high 120s at baseline - Sodium was 123 and has improved to 130 with IV fluids   Hyperkalemia - Potassium 5.9-has normalized    Obesity Body mass index is 31.01 kg/m.  Normocytic anemia   Obstructive sleep apnea - Have ordered a CPAP           Discharge Instructions  Discharge Instructions     Diet - low sodium heart healthy   Complete by: As directed    Diet Carb Modified   Complete by: As directed    Increase activity slowly   Complete by: As directed       Allergies as of 05/05/2022       Reactions   Azilsartan Other (See Comments)   Possibly caused hives (as Edarbyclor) on 06/16/14   Chlorthalidone Other (See Comments)   Possibly caused hives (as Edarbyclor) on 06/16/14   Ezetimibe Other (See Comments)  Shellfish Allergy    Statins Other (See Comments)   Amlodipine Hives, Itching, Rash   Indapamide Rash   Pruritic rash   Lisinopril Hives, Itching, Rash        Medication List     TAKE these medications    albuterol 108 (90 Base) MCG/ACT inhaler Commonly known as: VENTOLIN HFA Inhale 2 puffs into the lungs every 6 (six) hours as needed for wheezing or shortness of breath.   aspirin 81 MG tablet Take 81 mg by mouth daily.   Bystolic 10 MG tablet Generic drug: nebivolol Take 10 mg by mouth daily.    cefdinir 300 MG capsule Commonly known as: OMNICEF Take 1 capsule (300 mg total) by mouth 2 (two) times daily for 14 days.   Creon 24000-76000 units Cpep Generic drug: Pancrelipase (Lip-Prot-Amyl) Take 5-6 capsules (120,000-144,000 Units total) by mouth 3 (three) times daily with meals. Take 3 capsules with snacks.   cyanocobalamin 1000 MCG/ML injection Commonly known as: VITAMIN B12 Inject 1,000 mcg into the muscle every 30 (thirty) days.   glucosamine-chondroitin 500-400 MG tablet Take 1 tablet by mouth daily.   metroNIDAZOLE 500 MG tablet Commonly known as: Flagyl Take 1 tablet (500 mg total) by mouth 2 (two) times daily for 14 days.   NovoLOG 100 UNIT/ML injection Generic drug: insulin aspart Mini med insulin pump   pantoprazole 40 MG tablet Commonly known as: PROTONIX Take 40 mg by mouth daily.   valsartan 80 MG tablet Commonly known as: DIOVAN Take 1 tablet (80 mg total) by mouth daily.   Vitamin D (Ergocalciferol) 1.25 MG (50000 UNIT) Caps capsule Commonly known as: DRISDOL Take 50,000 Units by mouth every Monday.            The results of significant diagnostics from this hospitalization (including imaging, microbiology, ancillary and laboratory) are listed below for reference.    MR ABDOMEN MRCP W WO CONTAST  Result Date: 05/05/2022 CLINICAL DATA:  Jaundice.  History of lymphoma. EXAM: MRI ABDOMEN WITHOUT AND WITH CONTRAST (INCLUDING MRCP) TECHNIQUE: Multiplanar multisequence MR imaging of the abdomen was performed both before and after the administration of intravenous contrast. Heavily T2-weighted images of the biliary and pancreatic ducts were obtained, and three-dimensional MRCP images were rendered by post processing. CONTRAST:  65m GADAVIST GADOBUTROL 1 MMOL/ML IV SOLN COMPARISON:  05/03/2022 FINDINGS: Lower chest: Bilateral pleural effusions with overlying atelectasis noted. Hepatobiliary: Indeterminate. The liver has a slightly nodular contour. Early  arterial phase enhancement is heterogeneous with relative increased enhancement within the left lobe compared with the right. Several arterial phase enhancing foci without corresponding T2 abnormality identified. These become isointense to liver on the portal venous and delayed phase images without washout. Lesions include: -0.8 cm area of arterial phase enhancement in the anterior right lobe, image 30/15. -Adjacent area of enhancement measures 1.1 cm, image 30/15. These appear isointense on the portal venous and delayed phase images -area within the periphery of the right lobe of liver measures 1.9 cm, image 21/15. There are several peripheral enhancing foci identified within lateral segment of left lobe of liver with corresponding increased T2 signal, including - anterior, subcapsular T2 hyperintense lesion which exhibits peripheral enhancement measuring 1.2 cm, image 27/19. -adjacent lesion measures 0.6 cm, image 25/15. Moderate intrahepatic bile duct dilatation is again identified and appears unchanged from 05/03/2022. Increased T2 signal is noted within the lateral segment of left lobe concerning for edema. MRCP images are suboptimal secondary to motion artifact. Postoperative changes from Whipple procedure identified. Pancreas: There  is atrophy of the body and tail of pancreas. No inflammation or mass identified. Spleen:  Within normal limits in size and appearance. Adrenals/Urinary Tract: Normal adrenal glands. Kidneys are unremarkable. Stomach/Bowel: Stomach is unremarkable. Status post Whipple procedure. No bowel wall thickening or inflammation. Unchanged focally dilated loop of small bowel within the ventral abdomen which measures up to 3.6 cm, image 76/19. Vascular/Lymphatic: Normal appearance of the scratch set aortic atherosclerosis without aneurysm. Upper abdominal vascularity including the portal vein are patent. No adenopathy identified. Other: Trace edema/fluid within the retroperitoneum and central  mesentery. No discrete fluid collections. Right posterior body wall edema noted suggesting anasarca. Musculoskeletal: No suspicious bone lesions identified. IMPRESSION: 1. Morphologic features of the liver suggestive of early cirrhosis. 2. Asymmetric arterial phase hyperenhancement of the left lobe of liver is identified. Although nonspecific in the setting of chronic liver disease, findings may reflect hyperemia due to underlying inflammation/infection. Within this area there are several T2 hyperintense, peripherally enhancing foci, which were not seen on previous imaging. In the acute setting these may represent foci of small abscesses. Correlate for any clinical signs/symptoms of cholangitis. 3. There are several arterial phase enhancing foci without corresponding T2 abnormality. These become isointense to liver on the portal venous and delayed phase images without washout. These are indeterminate. In the setting of cirrhosis these may represent areas of regenerate of nodularity. Follow-up imaging in 3 months with repeat MRI of the abdomen is advised to confirm persistence. 4. Moderate intrahepatic bile duct dilatation is again identified and appears unchanged from 05/03/2022. Cannot exclude underlying stricture at the choledochojejunostomy site. MRCP images are suboptimal secondary to motion artifact. 5. Status post Whipple procedure. 6. Bilateral pleural effusions with overlying atelectasis. 7. Right posterior body wall edema noted suggesting anasarca. Electronically Signed   By: Kerby Moors M.D.   On: 05/05/2022 09:22   MR 3D Recon At Scanner  Result Date: 05/05/2022 CLINICAL DATA:  Jaundice.  History of lymphoma. EXAM: MRI ABDOMEN WITHOUT AND WITH CONTRAST (INCLUDING MRCP) TECHNIQUE: Multiplanar multisequence MR imaging of the abdomen was performed both before and after the administration of intravenous contrast. Heavily T2-weighted images of the biliary and pancreatic ducts were obtained, and  three-dimensional MRCP images were rendered by post processing. CONTRAST:  1m GADAVIST GADOBUTROL 1 MMOL/ML IV SOLN COMPARISON:  05/03/2022 FINDINGS: Lower chest: Bilateral pleural effusions with overlying atelectasis noted. Hepatobiliary: Indeterminate. The liver has a slightly nodular contour. Early arterial phase enhancement is heterogeneous with relative increased enhancement within the left lobe compared with the right. Several arterial phase enhancing foci without corresponding T2 abnormality identified. These become isointense to liver on the portal venous and delayed phase images without washout. Lesions include: -0.8 cm area of arterial phase enhancement in the anterior right lobe, image 30/15. -Adjacent area of enhancement measures 1.1 cm, image 30/15. These appear isointense on the portal venous and delayed phase images -area within the periphery of the right lobe of liver measures 1.9 cm, image 21/15. There are several peripheral enhancing foci identified within lateral segment of left lobe of liver with corresponding increased T2 signal, including - anterior, subcapsular T2 hyperintense lesion which exhibits peripheral enhancement measuring 1.2 cm, image 27/19. -adjacent lesion measures 0.6 cm, image 25/15. Moderate intrahepatic bile duct dilatation is again identified and appears unchanged from 05/03/2022. Increased T2 signal is noted within the lateral segment of left lobe concerning for edema. MRCP images are suboptimal secondary to motion artifact. Postoperative changes from Whipple procedure identified. Pancreas: There is atrophy of the  body and tail of pancreas. No inflammation or mass identified. Spleen:  Within normal limits in size and appearance. Adrenals/Urinary Tract: Normal adrenal glands. Kidneys are unremarkable. Stomach/Bowel: Stomach is unremarkable. Status post Whipple procedure. No bowel wall thickening or inflammation. Unchanged focally dilated loop of small bowel within the  ventral abdomen which measures up to 3.6 cm, image 76/19. Vascular/Lymphatic: Normal appearance of the scratch set aortic atherosclerosis without aneurysm. Upper abdominal vascularity including the portal vein are patent. No adenopathy identified. Other: Trace edema/fluid within the retroperitoneum and central mesentery. No discrete fluid collections. Right posterior body wall edema noted suggesting anasarca. Musculoskeletal: No suspicious bone lesions identified. IMPRESSION: 1. Morphologic features of the liver suggestive of early cirrhosis. 2. Asymmetric arterial phase hyperenhancement of the left lobe of liver is identified. Although nonspecific in the setting of chronic liver disease, findings may reflect hyperemia due to underlying inflammation/infection. Within this area there are several T2 hyperintense, peripherally enhancing foci, which were not seen on previous imaging. In the acute setting these may represent foci of small abscesses. Correlate for any clinical signs/symptoms of cholangitis. 3. There are several arterial phase enhancing foci without corresponding T2 abnormality. These become isointense to liver on the portal venous and delayed phase images without washout. These are indeterminate. In the setting of cirrhosis these may represent areas of regenerate of nodularity. Follow-up imaging in 3 months with repeat MRI of the abdomen is advised to confirm persistence. 4. Moderate intrahepatic bile duct dilatation is again identified and appears unchanged from 05/03/2022. Cannot exclude underlying stricture at the choledochojejunostomy site. MRCP images are suboptimal secondary to motion artifact. 5. Status post Whipple procedure. 6. Bilateral pleural effusions with overlying atelectasis. 7. Right posterior body wall edema noted suggesting anasarca. Electronically Signed   By: Kerby Moors M.D.   On: 05/05/2022 09:22   DG Chest Port 1 View  Result Date: 05/03/2022 CLINICAL DATA:  Possible  sepsis, epigastric pain EXAM: PORTABLE CHEST 1 VIEW COMPARISON:  03/21/2021 FINDINGS: Transverse diameter of heart is increased. Increased interstitial markings are seen in both lungs. Similar finding was seen in the previous study. There is no focal consolidation. There is no pleural effusion or pneumothorax. IMPRESSION: Increased interstitial markings are seen in both lungs suggesting scarring or mild interstitial edema or interstitial pneumonia. There is no focal consolidation. There is no pleural effusion or pneumothorax. Electronically Signed   By: Elmer Picker M.D.   On: 05/03/2022 12:35   CT Abdomen Pelvis W Contrast  Result Date: 05/03/2022 CLINICAL DATA:  61 year old male with 2 days of abdominal pain. Epigastric pain. History of pancreatic lymphoma and remote Whipple surgery. EXAM: CT ABDOMEN AND PELVIS WITH CONTRAST TECHNIQUE: Multidetector CT imaging of the abdomen and pelvis was performed using the standard protocol following bolus administration of intravenous contrast. RADIATION DOSE REDUCTION: This exam was performed according to the departmental dose-optimization program which includes automated exposure control, adjustment of the mA and/or kV according to patient size and/or use of iterative reconstruction technique. CONTRAST:  67m OMNIPAQUE IOHEXOL 300 MG/ML  SOLN COMPARISON:  CT Abdomen and Pelvis 03/21/2021 and earlier. Abdomen MRI 04/20/2021. FINDINGS: Lower chest: Small/trace new layering right pleural effusion. Trace layering left pleural fluid is also new or increased. Stable cardiac size, within normal limits. No pericardial effusion. Mildly dilated and fluid-filled distal esophagus (series 2, image 7), but the GEJ is tapered and the stomach is decompressed (see below). Hepatobiliary: Chronic but increased generalized intrahepatic biliary ductal dilatation (series 2, image 20 versus series 2, image  21 a year ago). Resolved pneumobilia since last year. Otherwise liver  enhancement is within normal limits, no discrete liver lesion. Distal extrahepatic bile ducts seem decompressed. Pancreas: Stable surgical absence status post Whipple. Spleen: Stable, negative. Adrenals/Urinary Tract: Stable and negative adrenal glands, kidneys. No hydronephrosis or hydroureter. 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 Stomach/Bowel: Mild motion artifact. Negative large bowel including the appendix (coronal image 60). Decompressed terminal ileum. No acutely dilated small bowel in the abdomen or pelvis. A capacious right abdominal small bowel anastomosis seen coronal images 83 through 103 are stable or smaller compared to the CT last year. Associated ventral abdominal rectus muscle diastasis and/or marginal left paramidline hernia on series 2, image 40 does not appear inflamed. Superimposed sequelae of Whipple procedure are stable. Stomach and gastrojejunostomy are decompressed. No free air or free fluid. Vascular/Lymphatic: Extensive Aortoiliac calcified atherosclerosis. Major arterial structures in the abdomen and pelvis remain patent. No lymphadenopathy identified. Portal venous system appears to be patent. Reproductive: Bladder containing left inguinal hernia redemonstrated (series 2, image 77), 11 cm in length, with a roughly 5 cm segment of herniated urinary bladder along with fat/mesentery in the inguinal canal. Underlying small bladder volume. No active inflammation identified. Other: No pelvic free fluid. Musculoskeletal: No acute or suspicious osseous lesion identified. IMPRESSION: 1. Negative for bowel obstruction. Bowel appears stable status post Whipple related bypass. But fluid-filled distal esophagus is new from last year, query Gastroesophageal Reflux or Esophageal Dysmotility. 2. Progressed generalized Intrahepatic Biliary Ductal Dilatation since last year. But extrahepatic bile ducts seem decompressed. If there is hyperbilirubinemia then repeat MRCP may be valuable  when the patient can breath hold. 3. Small/trace Pleural Effusion(s), new from last year and nonspecific. 4. Other stable sequelae of Whipple. 5.  Aortic Atherosclerosis (ICD10-I70.0). Electronically Signed   By: Genevie Ann M.D.   On: 05/03/2022 10:54   Labs:   Basic Metabolic Panel: Recent Labs  Lab 05/03/22 0913 05/03/22 1404 05/03/22 1930 05/04/22 0404 05/05/22 0522  NA 123* 126*  --  130* 130*  K 5.9* 3.8  --  3.6 3.7  CL 89* 94*  --  101 101  CO2 21* 22  --  21* 21*  GLUCOSE 96 100*  --  69* 54*  BUN 21 19  --  20 16  CREATININE 1.36* 1.08 0.94 1.05 0.98  CALCIUM 8.4* 7.8*  --  7.5* 8.0*     CBC: Recent Labs  Lab 05/03/22 0913 05/03/22 1930 05/04/22 0404 05/05/22 0522  WBC 23.0* 15.2* 11.4* 10.4  HGB 13.3 12.3* 11.6* 11.7*  HCT 37.8* 35.8* 33.5* 33.9*  MCV 83.4 84.4 83.8 83.9  PLT 188 146* 138* 139*         SIGNED:   Debbe Odea, MD  [facility] 05/05/2022, 1:30 PM

## 2022-05-08 DIAGNOSIS — Z9641 Presence of insulin pump (external) (internal): Secondary | ICD-10-CM | POA: Diagnosis not present

## 2022-05-08 DIAGNOSIS — E109 Type 1 diabetes mellitus without complications: Secondary | ICD-10-CM | POA: Diagnosis not present

## 2022-05-08 DIAGNOSIS — E1065 Type 1 diabetes mellitus with hyperglycemia: Secondary | ICD-10-CM | POA: Diagnosis not present

## 2022-05-08 LAB — CULTURE, BLOOD (ROUTINE X 2)
Culture: NO GROWTH
Special Requests: ADEQUATE

## 2022-05-09 ENCOUNTER — Telehealth: Payer: Self-pay

## 2022-05-09 ENCOUNTER — Other Ambulatory Visit: Payer: Self-pay

## 2022-05-09 DIAGNOSIS — R7989 Other specified abnormal findings of blood chemistry: Secondary | ICD-10-CM

## 2022-05-09 NOTE — Telephone Encounter (Signed)
FU Received: 4 days ago Jackquline Denmark, MD  Gillermina Hu, RN; Lavena Bullion, DO Remo Lipps,  Pl arrange for CBC, LFTs in 2 weeks FU APP or Dr C in 4 weeks. He will make appt @ [facility] himself  RG

## 2022-05-09 NOTE — Telephone Encounter (Signed)
Pt was made aware of Dr. Lyndel Safe recommendations: Orders for labs placed in Epic: Pt made aware to come around 05/18/2022: Location to lab provided: Pt scheduled for a follow up appointment with Dr. Bryan Lemma on 06/07/2022 at 2:00 PM: Pt made aware. Pt verbalized understanding with all questions answered.

## 2022-05-15 ENCOUNTER — Other Ambulatory Visit (INDEPENDENT_AMBULATORY_CARE_PROVIDER_SITE_OTHER): Payer: BC Managed Care – PPO

## 2022-05-15 DIAGNOSIS — R7989 Other specified abnormal findings of blood chemistry: Secondary | ICD-10-CM

## 2022-05-15 LAB — HEPATIC FUNCTION PANEL
ALT: 88 U/L — ABNORMAL HIGH (ref 0–53)
AST: 132 U/L — ABNORMAL HIGH (ref 0–37)
Albumin: 3.4 g/dL — ABNORMAL LOW (ref 3.5–5.2)
Alkaline Phosphatase: 1144 U/L — ABNORMAL HIGH (ref 39–117)
Bilirubin, Direct: 0.9 mg/dL — ABNORMAL HIGH (ref 0.0–0.3)
Total Bilirubin: 2 mg/dL — ABNORMAL HIGH (ref 0.2–1.2)
Total Protein: 6.9 g/dL (ref 6.0–8.3)

## 2022-05-15 LAB — CBC WITH DIFFERENTIAL/PLATELET
Basophils Absolute: 0.2 10*3/uL — ABNORMAL HIGH (ref 0.0–0.1)
Basophils Relative: 1.7 % (ref 0.0–3.0)
Eosinophils Absolute: 0.2 10*3/uL (ref 0.0–0.7)
Eosinophils Relative: 1.6 % (ref 0.0–5.0)
HCT: 39.5 % (ref 39.0–52.0)
Hemoglobin: 13.1 g/dL (ref 13.0–17.0)
Lymphocytes Relative: 12.4 % (ref 12.0–46.0)
Lymphs Abs: 1.4 10*3/uL (ref 0.7–4.0)
MCHC: 33.1 g/dL (ref 30.0–36.0)
MCV: 85.6 fl (ref 78.0–100.0)
Monocytes Absolute: 0.6 10*3/uL (ref 0.1–1.0)
Monocytes Relative: 5.6 % (ref 3.0–12.0)
Neutro Abs: 9.1 10*3/uL — ABNORMAL HIGH (ref 1.4–7.7)
Neutrophils Relative %: 78.7 % — ABNORMAL HIGH (ref 43.0–77.0)
Platelets: 409 10*3/uL — ABNORMAL HIGH (ref 150.0–400.0)
RBC: 4.61 Mil/uL (ref 4.22–5.81)
RDW: 15.9 % — ABNORMAL HIGH (ref 11.5–15.5)
WBC: 11.6 10*3/uL — ABNORMAL HIGH (ref 4.0–10.5)

## 2022-05-17 ENCOUNTER — Encounter: Payer: Self-pay | Admitting: Gastroenterology

## 2022-05-17 ENCOUNTER — Ambulatory Visit (INDEPENDENT_AMBULATORY_CARE_PROVIDER_SITE_OTHER): Payer: BC Managed Care – PPO | Admitting: Gastroenterology

## 2022-05-17 VITALS — BP 130/60 | HR 56 | Ht 69.0 in | Wt 205.0 lb

## 2022-05-17 DIAGNOSIS — R748 Abnormal levels of other serum enzymes: Secondary | ICD-10-CM

## 2022-05-17 DIAGNOSIS — E109 Type 1 diabetes mellitus without complications: Secondary | ICD-10-CM

## 2022-05-17 DIAGNOSIS — K8309 Other cholangitis: Secondary | ICD-10-CM | POA: Diagnosis not present

## 2022-05-17 DIAGNOSIS — R933 Abnormal findings on diagnostic imaging of other parts of digestive tract: Secondary | ICD-10-CM | POA: Diagnosis not present

## 2022-05-17 DIAGNOSIS — C8599 Non-Hodgkin lymphoma, unspecified, extranodal and solid organ sites: Secondary | ICD-10-CM | POA: Diagnosis not present

## 2022-05-17 DIAGNOSIS — Z1211 Encounter for screening for malignant neoplasm of colon: Secondary | ICD-10-CM

## 2022-05-17 DIAGNOSIS — Z9049 Acquired absence of other specified parts of digestive tract: Secondary | ICD-10-CM

## 2022-05-17 DIAGNOSIS — Z90410 Acquired total absence of pancreas: Secondary | ICD-10-CM

## 2022-05-17 NOTE — Patient Instructions (Addendum)
Your provider has requested that you go to the basement level for lab work on Monday. Press "B" on the elevator. The lab is located at the first door on the left as you exit the elevator.  You will be contacted by [facility]  to schedule a consult.   If you are age 63 or younger, your body mass index should be between 19-25. Your Body mass index is 30.27 kg/m. If this is out of the aformentioned range listed, please consider follow up with your Primary Care Provider.   __________________________________________________________  The [facility] providers would like to encourage you to use [facility] to communicate with providers for non-urgent requests or questions.  Due to long hold times on the telephone, sending your provider a message by [facility] may be a faster and more efficient way to get a response.  Please allow 48 business hours for a response.  Please remember that this is for non-urgent requests.   Due to recent changes in healthcare laws, you may see the results of your imaging and laboratory studies on MyChart before your provider has had a chance to review them.  We understand that in some cases there may be results that are confusing or concerning to you. Not all laboratory results come back in the same time frame and the provider may be waiting for multiple results in order to interpret others.  Please give [place] 48 hours in order for your provider to thoroughly review all the results before contacting the office for clarification of your results.    Thank you for choosing me and [facility].  Vito Cirigliano, D.O.

## 2022-05-17 NOTE — Progress Notes (Signed)
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 In 2014 had transhepatic biliary stents placed by IR at [facility] for obstructing benign CBD stricture (biliary enteric anastomosis not accessible endoscopically).   Hospital admission 03/2021 for Klebsiella and E. coli bacteremia and transient pSBO  treated with vancomycin and Zosyn and bowel rest.  No surgery needed.  Inpatient GI team consulted for elevated liver enzymes which were attributed to cholestasis of sepsis and were downtrending at the time of discharge.  Repeat hospital admission on 04/2022 again with probable ascending cholangitis, small hepatic abscesses, and suspected recently passed stone.  Previous pertinent imaging: - 10/03/2018: MRCP: Minimal intrahepatic biliary ductal dilatation with some irregular beading appearance of the left hepatic duct and the common hepatic duct at the hilar confluence.  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  Postsurgical Whipple changes. - 03/21/2021: CT: e/o prior ccy, Whipple.  Pneumobilia but otherwise normal-appearing liver.  Atrophic remaining pancreas without PD dilation.  Focal dilation of the small bowel in the RUQ adjacent to the anastomosis.  Normal-appearing colon.  Small to moderate size left inguinal hernia containing portions of the bladder - 03/22/2021: RUQ [place]: ccy, normal-appearing liver with 2.7 mm CBD.  Normal Dopplers -04/20/2021: MRCP: Stable mild to moderate intrahepatic biliary ductal dilatation without obstructing lesion.  Postsurgical Whipple changes. - 05/03/2022: CT A/P: Whipple anatomy, fluid-filled esophagus c/w GERD or dysmotility, progressed general intrahepatic biliary duct dilation from 1 year prior - 05/05/2022: MRCP: Asymmetric arterial enhancement in the left  lobe of the liver suspicious for foci of small abscesses, morphologic features suggestive of early cirrhosis, moderate intrahepatic duct dilatation unchanged from previous imaging.  Cannot exclude underlying stricture at choledochojejunostomy site.  Whipple anatomy.  Recommended repeat MRI in 3 months  Endoscopic History: - EUS (07/2012, Dr. Paulita Fujita): Small HH, patent Schatzki's ring, moderate LA grade B/C esophagitis.  Prior pancreaticoduodenectomy with healthy anastomosis.  EUS with grossly normal pancreatic remnant.  Intrahepatic biliary dilation. - Colonoscopy (07/2017, Dr. Earlean Shawl): Normal.  Recommended repeat in 5 years due to history of polyps   HPI:     Patient is a 62 y.o. male presenting to the [facility] for hospital follow-up.  He was admitted 12/21-23 with lactic acidosis, leukocytosis, and diagnosed with small hepatic abscesses with possible ascending cholangitis and passed stone.  Was treated with IV ABX with swift clinical improvement.  Could not tolerate FLQ due to prolonged QTc, and was discharged with Omnicef and Flagyl.  MRCP with increased hepatic duct dilatation.  Inpatient GI service was consulted and recommended outpatient referral to [facility] for possible EUS and ERCP.  CEA normal.  CA 19-9 102.  Cultures were negative.  Repeat labs as outpatient 05/15/2022: - WBC 11.6, PLT 409, H/H back to baseline at 13/39 - AST/ALT 132/88 (downtrending), T. bili 2.0 (downtrending).  ALP 1144 (up from 448 at discharge)  He states he is feeling much better now.  No active issues. Taking Abx as prescribed and has about 5 days remaining.  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 Valere Dross   Sleep apnea    mild to moderate; "couldn't tolerate CPAP; I wear an appliance" (09/03/2016)   Vitamin B12 deficiency    takes B12 every 30 days.   Vitamin D deficiency    takes Vitamin D daily    Patient's surgical history, family medical history, social history, medications and allergies were all reviewed in Epic    Current Outpatient Medications  Medication Sig Dispense Refill   albuterol (VENTOLIN HFA) 108 (90 Base) MCG/ACT inhaler Inhale 2 puffs into the lungs every 6 (six) hours as needed for wheezing or shortness of breath. 8 g 1   aspirin 81 MG tablet Take 81 mg by mouth daily.     BYSTOLIC 10 MG tablet Take 10 mg by mouth daily.  1   cefdinir (OMNICEF) 300 MG capsule Take 1 capsule (300 mg total) by mouth 2 (two) times daily for 14 days. 28 capsule 0   cyanocobalamin (,VITAMIN B-12,) 1000 MCG/ML injection Inject 1,000 mcg into the muscle every 30 (thirty) days.      glucosamine-chondroitin 500-400 MG tablet Take 1 tablet by mouth daily.      metroNIDAZOLE (FLAGYL) 500 MG tablet Take 1 tablet (500 mg total) by mouth 2 (two) times daily for 14 days. 28 tablet 0   NOVOLOG 100 UNIT/ML injection Mini med insulin pump     Pancrelipase, Lip-Prot-Amyl, (CREON) 24000-76000 units CPEP Take 5-6 capsules (120,000-144,000 Units total) by mouth 3 (three) times daily with meals. Take 3 capsules with snacks. 1620  capsule 2   pantoprazole (PROTONIX) 40 MG tablet Take 40 mg by mouth daily.     valsartan (DIOVAN) 80 MG tablet Take 1 tablet (80 mg total) by mouth daily. 30 tablet 0   Vitamin D, Ergocalciferol, (DRISDOL) 50000 UNITS CAPS Take 50,000 Units by mouth every Monday.      No current facility-administered medications for this visit.    Physical Exam:     BP 130/60   Pulse (!) 56   Ht '5\' 9"'$  (1.753 m)   Wt 205 lb (93 kg)   SpO2 100%   BMI 30.27 kg/m   GENERAL:  Pleasant male in NAD PSYCH: : Cooperative, normal affect NEURO: Alert and oriented x 3, no focal neurologic deficits   IMPRESSION and PLAN:    1) Elevated alkaline phosphatase 2) Abnormal MRI/MRCP 3) Recent ascending cholangitis and liver abscess - Complete course of antibiotics as prescribed - ALP uptrending despite downtrending AST/ALT/T. bili.  Plan to repeat liver enzymes in the next couple of days - CA 19-9 also elevated 102.  Discussed the potential DDx of that finding today. - Ultimately needs further endosonographic evaluation at an [facility].  Placed referral to [place] and GI for EUS and probable ERCP  with potential stent placement.  Of note, in 2014 he had to undergo transhepatic biliary stent placement by IR at [facility] for obstructing CBD stricture.  I am hopeful that these can be accessed endoscopically now - May need repeat MRI/MRCP in 3 months for follow-up of acute findings during recent admission  4) Leukocytosis WBC still 11.6 as outpatient. - Repeat CBC in the next couple of days for continued trending  5) History of Pancreatic Lymphoma s/p Whipple 2003, chemotherapy, radiation -As above, postoperative anatomy certainly makes EUS and any potential ERCP more technically challenging, thus placing referral to biliary service at academic facility  6) History of diabetes - Management per PCP  7) Colon cancer screening - Per his previous  Gastroenterologist, due for colonoscopy in 2024.  Will coordinate optimal timing colonoscopy after evaluation of the more pressing issues as above   Patient asked to be contacted by cell phone to coordinate any appointments, referrals, etc.  Number listed in chart.           Lavena Bullion ,DO, FACG 05/17/2022, 1:52 PM

## 2022-05-21 ENCOUNTER — Encounter: Payer: Self-pay | Admitting: Gastroenterology

## 2022-05-21 ENCOUNTER — Other Ambulatory Visit (INDEPENDENT_AMBULATORY_CARE_PROVIDER_SITE_OTHER): Payer: BC Managed Care – PPO

## 2022-05-21 DIAGNOSIS — R748 Abnormal levels of other serum enzymes: Secondary | ICD-10-CM | POA: Diagnosis not present

## 2022-05-21 DIAGNOSIS — K8309 Other cholangitis: Secondary | ICD-10-CM

## 2022-05-21 LAB — CBC
HCT: 41 % (ref 39.0–52.0)
Hemoglobin: 13.6 g/dL (ref 13.0–17.0)
MCHC: 33.2 g/dL (ref 30.0–36.0)
MCV: 85.8 fl (ref 78.0–100.0)
Platelets: 341 10*3/uL (ref 150.0–400.0)
RBC: 4.78 Mil/uL (ref 4.22–5.81)
RDW: 16 % — ABNORMAL HIGH (ref 11.5–15.5)
WBC: 7.7 10*3/uL (ref 4.0–10.5)

## 2022-05-21 LAB — HEPATIC FUNCTION PANEL
ALT: 126 U/L — ABNORMAL HIGH (ref 0–53)
AST: 194 U/L — ABNORMAL HIGH (ref 0–37)
Albumin: 3.7 g/dL (ref 3.5–5.2)
Alkaline Phosphatase: 1098 U/L — ABNORMAL HIGH (ref 39–117)
Bilirubin, Direct: 1.4 mg/dL — ABNORMAL HIGH (ref 0.0–0.3)
Total Bilirubin: 2.2 mg/dL — ABNORMAL HIGH (ref 0.2–1.2)
Total Protein: 7.2 g/dL (ref 6.0–8.3)

## 2022-05-24 DIAGNOSIS — I10 Essential (primary) hypertension: Secondary | ICD-10-CM | POA: Diagnosis not present

## 2022-05-24 DIAGNOSIS — E109 Type 1 diabetes mellitus without complications: Secondary | ICD-10-CM | POA: Diagnosis not present

## 2022-06-07 ENCOUNTER — Ambulatory Visit: Payer: BC Managed Care – PPO | Admitting: Gastroenterology

## 2022-06-08 DIAGNOSIS — E1065 Type 1 diabetes mellitus with hyperglycemia: Secondary | ICD-10-CM | POA: Diagnosis not present

## 2022-06-08 DIAGNOSIS — Z9641 Presence of insulin pump (external) (internal): Secondary | ICD-10-CM | POA: Diagnosis not present

## 2022-06-08 DIAGNOSIS — E109 Type 1 diabetes mellitus without complications: Secondary | ICD-10-CM | POA: Diagnosis not present

## 2022-06-19 DIAGNOSIS — E10649 Type 1 diabetes mellitus with hypoglycemia without coma: Secondary | ICD-10-CM | POA: Diagnosis not present

## 2022-06-19 DIAGNOSIS — E1065 Type 1 diabetes mellitus with hyperglycemia: Secondary | ICD-10-CM | POA: Diagnosis not present

## 2022-06-19 DIAGNOSIS — N182 Chronic kidney disease, stage 2 (mild): Secondary | ICD-10-CM | POA: Diagnosis not present

## 2022-06-19 DIAGNOSIS — E1022 Type 1 diabetes mellitus with diabetic chronic kidney disease: Secondary | ICD-10-CM | POA: Diagnosis not present

## 2022-06-19 DIAGNOSIS — Z9641 Presence of insulin pump (external) (internal): Secondary | ICD-10-CM | POA: Diagnosis not present

## 2022-06-22 ENCOUNTER — Other Ambulatory Visit: Payer: Self-pay | Admitting: Gastroenterology

## 2022-07-03 DIAGNOSIS — E1065 Type 1 diabetes mellitus with hyperglycemia: Secondary | ICD-10-CM | POA: Diagnosis not present

## 2022-07-03 DIAGNOSIS — K831 Obstruction of bile duct: Secondary | ICD-10-CM | POA: Diagnosis not present

## 2022-07-03 NOTE — Progress Notes (Signed)
 HISTORY OF PRESENT ILLNESS: The patient is a 62 y.o. male with a PMHx of B Cell Lymphoma in the pancreas s/p Whipple in 2003 with chemotherapy and radiation, ventral hernia repair 2018, type 1 diabetes, OSA, HTN, HLD, GERD, biliary stricture s/p stent with IR 2014 (biliary enteric anastomosis not accessible endoscopically) who is referred from [facility] for evaluation of biliary ductal dilation s/p prior Whipple and consideration for EUS/ERCP.  OSH records reviewed: Pancreatic B Cell Lymphoma s/p Whipple in 2003 with chemotherapy and radiation. Had ERCP for distal biliary stricture 01/2002 followed by Whipple 02/18/02  Developed anastomotic stricture s/p IR placement of PBD 07/2012 with resolution of stricture 10/2012 ([facility]).  EUS 07/2012 (Dr. Burnette): Small HH, patent Schatzki's ring, moderate LA grade B/C esophagitis. Prior pancreaticoduodenectomy with healthy anastomosis. EUS with grossly normal pancreatic remnant. Intrahepatic biliary dilation.  10/03/2018 MRCP: Minimal intrahepatic biliary ductal dilatation with some irregular beading appearance of the left hepatic duct and the common hepatic duct at the hilar confluence. 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 Postsurgical Whipple changes.  Colonoscopy 07/2017 (Dr. Luis): Normal. Recommended repeat in 5 years due to history of polyps  Hospital admission 03/2021 for Klebsiella and E. coli bacteremia and transient pSBO treated with vancomycin  and Zosyn  and bowel rest. No surgery. Inpatient GI team consulted for elevated liver enzymes which were attributed to cholestasis of sepsis and were downtrending at the time of discharge.  03/21/2021 CT: evidence of prior ccy, Whipple. Pneumobilia but otherwise normal-appearing liver. Atrophic remaining pancreas without PD dilation. Focal dilation of the small bowel in the RUQ adjacent to the anastomosis. Normal-appearing colon. Small to moderate size left inguinal hernia containing portions of the  bladder  03/22/2021 RUQ US : ccy, normal-appearing liver with 2.7 mm CBD. Normal Dopplers  04/20/2021: MRCP: Stable mild to moderate intrahepatic biliary ductal dilatation without obstructing lesion. Postsurgical Whipple changes.  Hospital admission 12/21-12/23/23 with probable ascending cholangitis, small hepatic abscesses, and suspected recently passed stone. Treated with IV abx. CEA normal. CA 19-9 102. Cultures were negative.  05/03/2022: CT A/P: Whipple anatomy, fluid-filled esophagus c/w GERD or dysmotility, progressed general intrahepatic biliary duct dilation from 1 year prior  05/05/2022: MRCP: Asymmetric arterial enhancement in the left lobe of the liver suspicious for foci of small abscesses, morphologic features suggestive of early cirrhosis, moderate intrahepatic duct dilatation unchanged from previous imaging. Cannot exclude underlying stricture at choledochojejunostomy site. Whipple anatomy. Recommended repeat MRI in 3 months  Seen by GI at [facility] 05/17/22. Labs on 05/15/22 with TB 2, AP 1144, AST 132, ALT 88  Today:  Past Medical History:  Diagnosis Date  . Ankle arthritis    posttraumatic degenerative   . Asthma, mild    mild, quiescent since childhood. Hoistory of minial symptoms ( e.g. with dog exposure).  Aytec as needed (rare).  . Dupuytren's contracture   . Erectile dysfunction   . Jaundice    obstructive, with nearly complete obstruction in the region of the prior whipple anastomosis  . PSA elevation   . Shellfish allergy    once self -administered EpiPen   . Snoring    mild to moderate sleep apena   . Vitamin D deficiency   . Whipple disease    S/P 02/18/02  chemotherapy and rad8iation therapy for pancreatic B cell lymphoma     Past Surgical History:  Procedure Laterality Date  . CHOLECYSTECTOMY    . ERCP W/ METAL STENT PLACEMENT    . IRRIGATION & DEBRIDEMENT PILONIDAL ABSCESS    . STENT PLACEMENT  Biliary   . WHIPPLE PROCEDURE     pancreatic b cell  lymphoma in 10/03      Current Outpatient Medications  Medication Sig Dispense Refill  . ACCU-CHEK AVIVA PLUS METER Misc USE TO TEST BLOOD SUGAR AS DIRECTED 1 each 2  . albuterol  90 mcg/actuation inhaler Use 2 puffs as directed every 4-6 hours as needed for asthma 1 each 12  . aspirin  81 MG EC tablet *ANTIPLATELET* Take 1 tablet once a day    . blood sugar diagnostic (ACCU-CHEK GUIDE TEST STRIPS) Strp test strips 1 each by Not Applicable route 8 times daily. 300 strip 11  . cholecalciferol, vitamin D3, 50,000 unit Tab ([employer] D3-50) Take 1 capsule once a week for vitamin D deficiency    . cyanocobalamin  (VITAMIN B-12) 1,000 mcg/mL injection Inject 1000 mcg as directed monthly 30 mL 4  . EPINEPHrine (EPIPEN) 0.3 mg/0.3 mL auto-injector Inject 0.3 mLs (0.3 mg total) into the muscle once as needed for Anaphylaxis.    SABRA esomeprazole (NEXIUM, NEXIUM 24HR) 20 MG capsule TAKE 1 CAPSULE(20 MG) BY MOUTH DAILY 90 capsule 2  . evolocumab (REPATHA SURECLICK) 140 mg/mL injection Inject 1 mL (140 mg total) into the skin every 14 days. 2 mL 11  . fluticasone propionate (FLONASE) 50 mcg/actuation nasal spray As needed    5  . glucagon (BAQSIMI) 3 mg/actuation nasal spray Administer 1 spray into affected nostril as needed. 2 each 11  . GLUCOSAMINE HCL/CHONDROITIN SU (GLUCOSAMINE-CHONDROITIN ORAL) Take 1 tablet once a day    . ibuprofen (ADVIL,MOTRIN) 200 MG tablet Take 1 tablet as directed as needed before exercise    . infusion set for insulin  pump ISet Continuous subcutaneous insulin  infusion pump.    . insulin  syringe-needle U-100 0.3 mL 30 gauge x 5/16 Syrg 1 each by Misc.(Non-Drug; Combo Route) route 4 (four)  times daily as needed. 100 each 11  . MINIMED 670G INSULIN  PUMP Misc Use as directed    . naltrexone (REVIA) 50 mg tablet Take 1 tablet (50 mg total) by mouth daily for 30 days. 30 tablet 0  . nebivoloL  (BYSTOLIC ) 10 MG tablet TAKE 1 TABLET BY MOUTH EVERY DAY FOR BLOOD PRESSURE 90 tablet 4  .  NOVOLOG  U-100 INSULIN  ASPART 100 unit/mL injection INFUSE VIA INSULIN  PUMP AS DIRECTED UP TO 90 UNITS PER DAY. 80 mL 4  . pancrelipase , Lip-Prot-Amyl, (CREON ) 24,000-76,000 -120,000 unit CpDR capsule TAKE 4 CAPSULES BY MOUTH FOUR TIMES DAILY 1080 capsule 3  . pantoprazole  (PROTONIX ) 40 MG tablet TAKE 1 TABLET BY MOUTH EVERY DAY 90 tablet 4  . pen needle, diabetic 32 gauge x 5/32 Ndle 1 each by Misc.(Non-Drug; Combo Route) route daily. 30 each 11  . tolnaftate (TINACTIN) 1 % cream Apply topically.    . valsartan  (DIOVAN ) 320 MG tablet TAKE 1 TABLET BY MOUTH EVERY DAY FOR BLOOD PRESSURE 90 tablet 0   No current facility-administered medications for this visit.    Allergies  Allergen Reactions  . Shellfish Containing Products Other (See Comments)    Anaphylaxis.  Has EpiPen in case of recurrence  . Amlodipine Urticaria / Hives (ALLERGY), Itching / Pruritis (ALLERGY/intolerance), Rash (ALLERGY/intolerance) and Other (See Comments)    May have caused his urticaria on 03/17/14 and on 04/12/14  . Azilsartan Other (See Comments)    Possibly caused hives (as Edarbyclor) on 06/16/14  . Chlorthalidone Other (See Comments)    Possibly caused hives (as Edarbyclor) on 06/16/14   . Lisinopril Urticaria / Hives (ALLERGY), Itching /  Pruritis (ALLERGY/intolerance), Rash (ALLERGY/intolerance) and Other (See Comments)    May have caused his urticaria on 03/17/14  . Indapamide  Rash (ALLERGY/intolerance)    Pruritic rash    Family History  Problem Relation Age of Onset  . Parkinsonism Father    No family history of pancreatitis or pancreatic cancer  PERSONAL & SOCIAL HISTORY:  Social History   Tobacco Use  Smoking Status Never  Smokeless Tobacco Never    ROS: Aside from these and systems reviewed in the HPI, all other systems are reviewed and are negative in detail.  PHYSICAL EXAMINATION: Vital Signs: There were no vitals taken for this visit. General:  No acute distress, alert and oriented x 3,  well nourished, well developed.   Skin: good turgor, normal nail beds. There are no rashes or lesions: no jaundice HEENT:  PERRLA, EOMI, no scleral icterus, no cervical lymphadenopathy Cardiovascular: S1S2 normal  Regular rate and rhythm, no  rubs or gallops Respiratory:  Clear to auscultation bilaterally, no wheezes, rales, or rhonchi Abdomen:  Soft, nondistended, nontender, normoactive bowel sounds Extremities:  Warm and well-perfused, no clubbing, cyanosis, or edema Neuro: Alert and oriented; motor and sensory function grossly intact Psychiatric: interactive; appropriate affect  ASSESSMENT AND PLAN: 62 yo M with PMHx B Cell Lymphoma in the pancreas s/p Whipple in 2003 with chemotherapy and radiation, ventral hernia repair 2018, type 1 diabetes, OSA, HTN, HLD, GERD, and biliary stricture s/p stent with IR 2014 ([facility]- biliary enteric anastomosis not accessible endoscopically). Patient was admitted in December 2023 with abnormal imaging and elevated LFTs. He has done well since then with no complaints of fatigue, abdominal pain, nausea, vomiting, or jaundice.   Will repeat LFTs today along with MRCP for further evaluation of anastomotic biliary stricture. Future diagnostic and therapeutic measures will be considered following receipt of these results. Patient in agreement with the plan.    Justus Medal, MD Professor of Medicine Division of Gastroenterology [facility]    Electronically signed by: Medal Justus, MD 07/03/22 [phone]

## 2022-07-09 DIAGNOSIS — E1065 Type 1 diabetes mellitus with hyperglycemia: Secondary | ICD-10-CM | POA: Diagnosis not present

## 2022-07-09 DIAGNOSIS — Z9641 Presence of insulin pump (external) (internal): Secondary | ICD-10-CM | POA: Diagnosis not present

## 2022-07-09 DIAGNOSIS — E109 Type 1 diabetes mellitus without complications: Secondary | ICD-10-CM | POA: Diagnosis not present

## 2022-07-27 DIAGNOSIS — Z9641 Presence of insulin pump (external) (internal): Secondary | ICD-10-CM | POA: Diagnosis not present

## 2022-07-27 DIAGNOSIS — E1065 Type 1 diabetes mellitus with hyperglycemia: Secondary | ICD-10-CM | POA: Diagnosis not present

## 2022-07-27 DIAGNOSIS — E109 Type 1 diabetes mellitus without complications: Secondary | ICD-10-CM | POA: Diagnosis not present

## 2022-08-08 DIAGNOSIS — Z9641 Presence of insulin pump (external) (internal): Secondary | ICD-10-CM | POA: Diagnosis not present

## 2022-08-08 DIAGNOSIS — E1065 Type 1 diabetes mellitus with hyperglycemia: Secondary | ICD-10-CM | POA: Diagnosis not present

## 2022-08-08 DIAGNOSIS — E109 Type 1 diabetes mellitus without complications: Secondary | ICD-10-CM | POA: Diagnosis not present

## 2022-08-14 DIAGNOSIS — R21 Rash and other nonspecific skin eruption: Secondary | ICD-10-CM | POA: Diagnosis not present

## 2022-08-16 DIAGNOSIS — E119 Type 2 diabetes mellitus without complications: Secondary | ICD-10-CM | POA: Diagnosis not present

## 2022-08-16 DIAGNOSIS — H5203 Hypermetropia, bilateral: Secondary | ICD-10-CM | POA: Diagnosis not present

## 2022-08-16 DIAGNOSIS — H25012 Cortical age-related cataract, left eye: Secondary | ICD-10-CM | POA: Diagnosis not present

## 2022-09-07 DIAGNOSIS — E109 Type 1 diabetes mellitus without complications: Secondary | ICD-10-CM | POA: Diagnosis not present

## 2022-09-07 DIAGNOSIS — Z9049 Acquired absence of other specified parts of digestive tract: Secondary | ICD-10-CM | POA: Diagnosis not present

## 2022-09-07 DIAGNOSIS — I10 Essential (primary) hypertension: Secondary | ICD-10-CM | POA: Diagnosis not present

## 2022-09-07 DIAGNOSIS — E7800 Pure hypercholesterolemia, unspecified: Secondary | ICD-10-CM | POA: Diagnosis not present

## 2022-09-21 ENCOUNTER — Other Ambulatory Visit: Payer: Self-pay | Admitting: Gastroenterology

## 2022-10-04 ENCOUNTER — Encounter (HOSPITAL_COMMUNITY): Payer: Self-pay | Admitting: Emergency Medicine

## 2022-10-04 ENCOUNTER — Emergency Department (HOSPITAL_COMMUNITY): Payer: BC Managed Care – PPO

## 2022-10-04 ENCOUNTER — Emergency Department (HOSPITAL_COMMUNITY)
Admission: EM | Admit: 2022-10-04 | Discharge: 2022-10-04 | Disposition: A | Discharge status: Home / Self Care | Payer: BC Managed Care – PPO | Attending: Emergency Medicine | Admitting: Emergency Medicine

## 2022-10-04 DIAGNOSIS — E119 Type 2 diabetes mellitus without complications: Secondary | ICD-10-CM | POA: Diagnosis not present

## 2022-10-04 DIAGNOSIS — I959 Hypotension, unspecified: Secondary | ICD-10-CM | POA: Diagnosis not present

## 2022-10-04 DIAGNOSIS — Z7982 Long term (current) use of aspirin: Secondary | ICD-10-CM | POA: Insufficient documentation

## 2022-10-04 DIAGNOSIS — R0902 Hypoxemia: Secondary | ICD-10-CM | POA: Diagnosis not present

## 2022-10-04 DIAGNOSIS — R109 Unspecified abdominal pain: Secondary | ICD-10-CM | POA: Diagnosis not present

## 2022-10-04 DIAGNOSIS — Z794 Long term (current) use of insulin: Secondary | ICD-10-CM | POA: Diagnosis not present

## 2022-10-04 DIAGNOSIS — R55 Syncope and collapse: Secondary | ICD-10-CM | POA: Insufficient documentation

## 2022-10-04 DIAGNOSIS — R1084 Generalized abdominal pain: Secondary | ICD-10-CM | POA: Diagnosis not present

## 2022-10-04 LAB — COMPREHENSIVE METABOLIC PANEL
ALT: 321 U/L — ABNORMAL HIGH (ref 0–44)
AST: 479 U/L — ABNORMAL HIGH (ref 15–41)
Albumin: 3.8 g/dL (ref 3.5–5.0)
Alkaline Phosphatase: 454 U/L — ABNORMAL HIGH (ref 38–126)
Anion gap: 9 (ref 5–15)
BUN: 19 mg/dL (ref 8–23)
CO2: 26 mmol/L (ref 22–32)
Calcium: 9 mg/dL (ref 8.9–10.3)
Chloride: 97 mmol/L — ABNORMAL LOW (ref 98–111)
Creatinine, Ser: 1.28 mg/dL — ABNORMAL HIGH (ref 0.61–1.24)
GFR, Estimated: >60 mL/min (ref >60)
Glucose, Bld: 142 mg/dL — ABNORMAL HIGH (ref 70–99)
Potassium: 4.7 mmol/L (ref 3.5–5.1)
Sodium: 132 mmol/L — ABNORMAL LOW (ref 135–145)
Total Bilirubin: 2.1 mg/dL — ABNORMAL HIGH (ref 0.3–1.2)
Total Protein: 7.1 g/dL (ref 6.5–8.1)

## 2022-10-04 LAB — CBC WITH DIFFERENTIAL/PLATELET
Abs Immature Granulocytes: 0.04 10*3/uL (ref 0.00–0.07)
Basophils Absolute: 0 10*3/uL (ref 0.0–0.1)
Basophils Relative: 0 %
Eosinophils Absolute: 0 10*3/uL (ref 0.0–0.5)
Eosinophils Relative: 0 %
HCT: 42.6 % (ref 39.0–52.0)
Hemoglobin: 14.1 g/dL (ref 13.0–17.0)
Immature Granulocytes: 0 %
Lymphocytes Relative: 2 %
Lymphs Abs: 0.3 10*3/uL — ABNORMAL LOW (ref 0.7–4.0)
MCH: 27.8 pg (ref 26.0–34.0)
MCHC: 33.1 g/dL (ref 30.0–36.0)
MCV: 83.9 fL (ref 80.0–100.0)
Monocytes Absolute: 0.6 10*3/uL (ref 0.1–1.0)
Monocytes Relative: 5 %
Neutro Abs: 11.6 10*3/uL — ABNORMAL HIGH (ref 1.7–7.7)
Neutrophils Relative %: 93 %
Platelets: 194 10*3/uL (ref 150–400)
RBC: 5.08 MIL/uL (ref 4.22–5.81)
RDW: 15.4 % (ref 11.5–15.5)
WBC: 12.6 10*3/uL — ABNORMAL HIGH (ref 4.0–10.5)
nRBC: 0 % (ref 0.0–0.2)

## 2022-10-04 LAB — URINALYSIS, ROUTINE W REFLEX MICROSCOPIC
Bilirubin Urine: NEGATIVE
Glucose, UA: NEGATIVE mg/dL
Hgb urine dipstick: NEGATIVE
Ketones, ur: NEGATIVE mg/dL
Leukocytes,Ua: NEGATIVE
Nitrite: NEGATIVE
Protein, ur: 30 mg/dL — AB
Specific Gravity, Urine: 1.018 (ref 1.005–1.030)
pH: 6 (ref 5.0–8.0)

## 2022-10-04 LAB — CBG MONITORING, ED: Glucose-Capillary: 131 mg/dL — ABNORMAL HIGH (ref 70–99)

## 2022-10-04 LAB — TROPONIN I (HIGH SENSITIVITY): Troponin I (High Sensitivity): 11 ng/L (ref <18)

## 2022-10-04 LAB — LIPASE, BLOOD: Lipase: 22 U/L (ref 11–51)

## 2022-10-04 MED ORDER — IOHEXOL 300 MG/ML  SOLN
100.0000 mL | Freq: Once | INTRAMUSCULAR | Status: AC | PRN
  Administered 2022-10-04: 100 mL via INTRAVENOUS

## 2022-10-04 NOTE — ED Triage Notes (Signed)
Pt BIB EMS from work, Transport planner. Loss consciousness for unknown amount of time. Did not hit head, denies pain N/V. Reported feeling of twisting to left side of stomach. LBBB at baseline.   BP 94/90 P 90 CBG 175

## 2022-10-04 NOTE — Discharge Instructions (Addendum)
Follow up with your Physician for recheck.  Return if any problems.  

## 2022-10-04 NOTE — ED Provider Notes (Signed)
[place] EMERGENCY DEPARTMENT AT [facility] Provider Note   CSN: [id number] Arrival date & time: 10/04/22  1149     History  Chief Complaint  Patient presents with   Loss of Consciousness    Robert Mercer is a 62 y.o. male.  Pt reports he was working today and had abdominal cramping and felt like he was going to pass out.  Pt reports he became weak.  Pt reports he did not black out.  Pt reports he is feeling better but still has some abdominal cramping.  Pt has a history of small bowel obstruction.  Pt reports he is diabetic and did not eat this am.  Pt denies fever or chills. No chest pain, no shortness of breath.   The history is provided by the patient. No language interpreter was used.       Home Medications Prior to Admission medications   Medication Sig Start Date End Date Taking? Authorizing Provider  albuterol (VENTOLIN HFA) 108 (90 Base) MCG/ACT inhaler Inhale 2 puffs into the lungs every 6 (six) hours as needed for wheezing or shortness of breath. 03/25/21   Zannie Cove, MD  aspirin 81 MG tablet Take 81 mg by mouth daily.    [provider]  BYSTOLIC 10 MG tablet Take 10 mg by mouth daily. 08/08/16   [provider]  cyanocobalamin (,VITAMIN B-12,) 1000 MCG/ML injection Inject 1,000 mcg into the muscle every 30 (thirty) days.     [provider]  glucosamine-chondroitin 500-400 MG tablet Take 1 tablet by mouth daily.     [provider]  NOVOLOG 100 UNIT/ML injection Mini med insulin pump 07/06/12   [provider]  Pancrelipase, Lip-Prot-Amyl, (CREON) 24000-76000 units CPEP TAKE 5-6 CAPSULES THREE TIMES DAILY WITH MEALS& 3 CAPSULES WITH SNACKS 09/21/22   Cirigliano, Vito V, DO  pantoprazole (PROTONIX) 40 MG tablet Take 40 mg by mouth daily. 04/13/21   [provider]  valsartan (DIOVAN) 80 MG tablet Take 1 tablet (80 mg total) by mouth daily. 03/25/21   Zannie Cove, MD  Vitamin D, Ergocalciferol,  (DRISDOL) 50000 UNITS CAPS Take 50,000 Units by mouth every Monday.     [provider]      Allergies    Azilsartan, Chlorthalidone, Ezetimibe, Shellfish allergy, Statins, Amlodipine, Indapamide, and Lisinopril    Review of Systems   Review of Systems  All other systems reviewed and are negative.   Physical Exam Updated Vital Signs BP (!) 168/84 (BP Location: Left Arm)   Pulse 81   Temp 98 F (36.7 C) (Oral)   Resp (!) 24   SpO2 98%  Physical Exam Vitals and nursing note reviewed.  Constitutional:      Appearance: He is well-developed.  HENT:     Head: Normocephalic.     Mouth/Throat:     Mouth: Mucous membranes are moist.  Cardiovascular:     Rate and Rhythm: Normal rate.  Pulmonary:     Effort: Pulmonary effort is normal.  Abdominal:     General: There is no distension.     Palpations: Abdomen is soft.     Tenderness: There is abdominal tenderness.     Comments: Palpable hernia   Musculoskeletal:        General: Normal range of motion.     Cervical back: Normal range of motion.  Skin:    General: Skin is warm.  Neurological:     General: No focal deficit present.     Mental  Status: He is alert and oriented to person, place, and time.  Psychiatric:        Mood and Affect: Mood normal.     ED Results / Procedures / Treatments   Labs (all labs ordered are listed, but only abnormal results are displayed) Labs Reviewed  CBC WITH DIFFERENTIAL/PLATELET - Abnormal; Notable for the following components:      Result Value   WBC 12.6 (*)    Neutro Abs 11.6 (*)    Lymphs Abs 0.3 (*)    All other components within normal limits  COMPREHENSIVE METABOLIC PANEL - Abnormal; Notable for the following components:   Sodium 132 (*)    Chloride 97 (*)    Glucose, Bld 142 (*)    Creatinine, Ser 1.28 (*)    AST 479 (*)    ALT 321 (*)    Alkaline Phosphatase 454 (*)    Total Bilirubin 2.1 (*)    All other components within normal limits  URINALYSIS, ROUTINE W  REFLEX MICROSCOPIC - Abnormal; Notable for the following components:   Color, Urine AMBER (*)    Protein, ur 30 (*)    Bacteria, UA RARE (*)    All other components within normal limits  CBG MONITORING, ED - Abnormal; Notable for the following components:   Glucose-Capillary 131 (*)    All other components within normal limits  LIPASE, BLOOD  TROPONIN I (HIGH SENSITIVITY)  TROPONIN I (HIGH SENSITIVITY)    EKG None  Radiology CT ABDOMEN PELVIS W CONTRAST  Result Date: 10/04/2022 CLINICAL DATA:  Abdominal pain. History of B-cell lymphoma of the pancreas, status post Whipple procedure. * Tracking Code: BO * . EXAM: CT ABDOMEN AND PELVIS WITH CONTRAST TECHNIQUE: Multidetector CT imaging of the abdomen and pelvis was performed using the standard protocol following bolus administration of intravenous contrast. RADIATION DOSE REDUCTION: This exam was performed according to the departmental dose-optimization program which includes automated exposure control, adjustment of the mA and/or kV according to patient size and/or use of iterative reconstruction technique. CONTRAST:  OMNIPAQUE IOHEXOL 300 MG/ML  SOLN COMPARISON:  Abdominal MRI 05/05/2022. Abdomen and pelvis CT 05/03/2022 FINDINGS: Lower chest: Bibasilar chronic atelectasis or scarring. Hepatobiliary: No suspicious focal abnormality within the liver parenchyma. Intrahepatic biliary duct dilatation again noted, similar to prior. Gallbladder surgically absent. Pneumobilia compatible with the history of biliary enteric anastomosis. Pancreas: Body and tail are diffusely atrophic. No main duct dilatation. Pancreatic head surgically absent. Spleen: No splenomegaly. No focal mass lesion. Adrenals/Urinary Tract: No adrenal nodule or mass. Kidneys unremarkable. No evidence for hydroureter. The urinary bladder appears normal for the degree of distention. Portion of the anterior left bladder is contained within a groin hernia, similar to prior.  Stomach/Bowel: Status post distal gastrectomy with gastrojejunal anastomosis. Roux limb is nondilated. Biliary limb is nondilated. No small bowel wall thickening. Focal small bowel wall thickening is seen in the anterior abdomen just cranial to the umbilicus, containing fecalized small bowel contents. This is in close proximity to a staple line and appears to represent small bowel anastomosis on coronal imaging (29/7) suggesting atony from small bowel anastomosis. Similar findings noted on the previous exam. The terminal ileum is normal. The appendix is normal. No gross colonic mass. No colonic wall thickening. Vascular/Lymphatic: There is moderate atherosclerotic calcification of the abdominal aorta without aneurysm. There is no gastrohepatic or hepatoduodenal ligament lymphadenopathy. No retroperitoneal lymphadenopathy. Lymph nodes in the central small bowel mesentery are slightly more prominent than on the prior study but  measure only upper normal for size. One of the more dominant lymph node seen on image 48/2 measures 8 mm short axis. No pelvic sidewall lymphadenopathy. Reproductive: The prostate gland and seminal vesicles are unremarkable. Other: No intraperitoneal free fluid. Musculoskeletal: The small wide-mouth left paraumbilical ventral hernia is again noted but appears shallow without herniation of small bowel or colon into the defect. No worrisome lytic or sclerotic osseous abnormality. IMPRESSION: 1. No acute findings in the abdomen or pelvis. Specifically, no findings to explain the patient's history of abdominal pain. 2. Persistent intrahepatic biliary duct prominence with extensive pneumobilia seen on today's exam. 3. Lymph nodes in the central small bowel mesentery are slightly more prominent than on the prior study but measure only upper normal for size. Consider follow-up CT in 3 months to ensure stability. 4. Status post Whipple procedure. 5. Left groin hernia containing a portion of the anterior  left bladder, similar to prior and without complicating features today. 6.  Aortic Atherosclerosis (ICD10-I70.0). Electronically Signed   By: Kennith Center M.D.   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    Amount and/or Complexity of Data Reviewed External Data Reviewed: notes.    Details: Gi notes reviewed.   Labs: ordered. Decision-making details documented in ED Course.    Details: Labs ordered reviewed and interpreted.  Pt has a wbc count of 12.6.  creatine is 1.28 Radiology: ordered and independent interpretation performed. Decision-making details documented in ED Course.    Details: Ct abdomen show no acute findings.   ECG/medicine tests: ordered and independent interpretation performed. Decision-making details documented in ED Course.    Details: Left bundle branch block   Risk Risk Details: Dr. Freida Busman in to see and examine.  He feels pt can go home.  Pt counseled on results            Final Clinical Impression(s) / ED Diagnoses Final diagnoses:  Near syncope    Rx / DC Orders ED Discharge Orders     None      An After Visit Summary was printed and given to the patient.    Osie Cheeks 10/04/22 1503    Lorre Nick, MD 10/05/22 [id number]

## 2022-10-04 NOTE — ED Provider Notes (Signed)
I provided a substantive portion of the care of this patient.  I personally made/approved the management plan for this patient and take responsibility for the patient management.   Patient here after near syncopal event while at work today.  Denies any shortness of breath or chest pain prior to event.  His EKG per my interpretation is unchanged from prior.  He is in sinus rhythm with a left bundle branch block.  Concern for possible obstruction and CT abdomen was negative.  He has had no emesis here.  Suspect possible vagal event.  Will discharge home  ED ECG REPORT   Date: 10/04/2022  Rate: 96  Rhythm: normal sinus rhythm  QRS Axis: normal  Intervals: normal  ST/T Wave abnormalities: nonspecific ST changes  Conduction Disutrbances:left bundle branch block  Narrative Interpretation:   Old EKG Reviewed: unchanged  I have personally reviewed the EKG tracing and agree with the computerized printout as noted.     Lorre Nick, MD 10/04/22 [id number]

## 2022-10-08 DIAGNOSIS — E1065 Type 1 diabetes mellitus with hyperglycemia: Secondary | ICD-10-CM | POA: Diagnosis not present

## 2022-10-08 DIAGNOSIS — E109 Type 1 diabetes mellitus without complications: Secondary | ICD-10-CM | POA: Diagnosis not present

## 2022-10-08 DIAGNOSIS — Z9641 Presence of insulin pump (external) (internal): Secondary | ICD-10-CM | POA: Diagnosis not present

## 2022-10-09 DIAGNOSIS — K831 Obstruction of bile duct: Secondary | ICD-10-CM | POA: Diagnosis not present

## 2022-10-09 NOTE — Progress Notes (Signed)
 Robert Mercer is a 62 y.o. 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 Had ERCP for distal biliary stricture 01/2002 followed by Whipple 02/18/02   Developed anastomotic stricture s/p IR placement of PBD 07/2012 with resolution of stricture 10/2012 ([facility]).   EUS 07/2012 (Dr. Burnette): Small HH, patent Schatzki's ring, moderate LA grade B/C esophagitis. Prior pancreaticoduodenectomy with healthy anastomosis. EUS with grossly normal pancreatic remnant. Intrahepatic biliary dilation.   10/03/2018 MRCP: Minimal intrahepatic biliary ductal dilatation with some irregular beading appearance of the left hepatic duct and the common hepatic duct at the hilar confluence. 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 Postsurgical Whipple changes.   Colonoscopy 07/2017 (Dr. Luis): Normal. Recommended repeat in 5 years due to history of polyps   Hospital admission 03/2021 for Klebsiella and E. coli bacteremia and transient pSBO treated with vancomycin  and Zosyn  and bowel rest. No surgery. Inpatient GI team consulted for elevated liver enzymes which were attributed to cholestasis of sepsis and were downtrending at the time of discharge.   03/21/2021 CT: evidence of prior ccy, Whipple. Pneumobilia but otherwise normal-appearing liver. Atrophic remaining pancreas without PD dilation. Focal dilation of the small bowel in the RUQ adjacent to the anastomosis. Normal-appearing colon. Small to moderate size left inguinal hernia containing portions of the bladder   03/22/2021 RUQ US : ccy, normal-appearing liver with 2.7 mm CBD. Normal Dopplers   04/20/2021: MRCP: Stable mild to moderate intrahepatic  biliary ductal dilatation without obstructing lesion. Postsurgical Whipple changes.   Hospital admission 12/21-12/23/23 with probable ascending cholangitis, small hepatic abscesses, and suspected recently passed stone. Treated with IV abx. CEA normal. CA 19-9 102. Cultures were negative.   05/03/2022: CT A/P: Whipple anatomy, fluid-filled esophagus c/w GERD or dysmotility, progressed general intrahepatic biliary duct dilation from 1 year prior   05/05/2022: MRCP: Asymmetric arterial enhancement in the left lobe of the liver suspicious for foci of small abscesses, morphologic features suggestive of early cirrhosis, moderate intrahepatic duct dilatation unchanged from previous imaging. Cannot exclude underlying stricture at choledochojejunostomy site. Whipple anatomy. Recommended repeat MRI in 3 months   Seen by GI at [facility] 05/17/22. Labs on 05/15/22 with TB 2, AP 1144, AST 132, ALT 88  Seen in clinic 07/03/22 (referred from [facility]) for evaluation of biliary ductal dilation s/p prior Whipple and consideration for EUS/ERCP. Patient was admitted in December 2023 with abnormal imaging and elevated LFTs. He has done well since then with no complaints of fatigue, abdominal pain, nausea, vomiting, or jaundice. Planned to repeat LFTs along with MRCP for further evaluation of anastomotic biliary stricture  07/03/22: TB 0.6, AP 396, AST 84, ALT 87  Patient had an ED visit to [facility] for a near syncopal event while at work, associated with left upper quadrant abdominal pain. Denied any shortness of breath or chest pain; EKG showed a sinus rhythm with a left bundle branch block. Suspect possible vagal event  CT a/p W/ 10/04/22: No suspicious focal abnormality within the liver parenchyma. Intrahepatic biliary duct dilatation again noted, similar to prior. Gallbladder surgically absent. Pneumobilia compatible with the history of biliary enteric anastomosis. Pancreas body and tail are diffusely atrophic. No main duct  dilatation. Pancreatic head surgically absent. Focal small bowel wall thickening  is seen in the anterior abdomen just cranial to the umbilicus, containing fecalized small bowel contents. This is in close proximity to a staple line and appears to represent small bowel anastomosis on coronal imaging (29/7) suggesting atony from small bowel anastomosis. Similar findings noted on the previous exam. No acute findings in the abdomen or pelvis. Lymph nodes in the central small bowel mesentery are slightly more prominent than on the prior study but measure only upper normal for size. Status post Whipple procedure. Left groin hernia containing a portion of the anterior left bladder, similar to prior and without complicating features today.  LFTS:  AST 15 - 41 U/L 479 High     ALT 0 - 44 U/L 321 High     Alkaline Phosphatase 38 - 126 U/L 454 High     Total Bilirubin 0.3 - 1.2 mg/dL 2.1 High         ROS:  A complete review of systems was otherwise negative, except as noted in the HPI.  Past Medical History: Past Medical History:  Diagnosis Date  . Ankle arthritis    posttraumatic degenerative   . Asthma, mild    mild, quiescent since childhood. Hoistory of minial symptoms ( e.g. with dog exposure).  Aytec as needed (rare).  . Dupuytren's contracture   . Erectile dysfunction   . Jaundice    obstructive, with nearly complete obstruction in the region of the prior whipple anastomosis  . PSA elevation   . Shellfish allergy    once self -administered EpiPen   . Snoring    mild to moderate sleep apena   . Vitamin D deficiency   . 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 Marital status: Single    Spouse name: Not on file  . Number of children: Not on file  . Years of education: Not on file  . Highest education level: Not on file  Occupational History  . Not on file   Tobacco Use  . Smoking status: Never  . Smokeless tobacco: Never  Substance and Sexual Activity  . Alcohol  use: Yes    Alcohol /week: 40.0 standard drinks of alcohol   . Drug use: Not on file  . Sexual activity: Not on file  Other Topics Concern  . Not on file  Social History Narrative  . Not on file   Social Determinants of Health   Food Insecurity: Patient Declined (05/03/2022)   Received from [facility]   Hunger Vital Sign   . Worried About Programme researcher, broadcasting/film/video in the Last Year: Patient declined   . Ran Out of Food in the Last Year: Patient declined  Transportation Needs: Patient Declined (05/03/2022)   Received from [facility] - Transportation   . Lack of Transportation (Medical): Patient declined   . Lack of Transportation (Non-Medical): Patient declined  Safety: Patient Declined (05/03/2022)   Received from [facility]   IPV   . Fear of Current or Ex-Partner: Patient declined   . Emotionally Abused: Patient declined   . Physically Abused: Patient declined   . Sexually Abused: Patient declined  Living Situation: Not on file    Family History: family history includes Parkinsonism in his father.  Medications: Current Outpatient Medications  Medication Sig Dispense Refill  . Accu-Chek Aviva Plus Meter misc USE TO TEST BLOOD SUGAR AS DIRECTED 1 each 2  .  albuterol  HFA (PROVENTIL  HFA;VENTOLIN  HFA;PROAIR  HFA) 90 mcg/actuation inhaler Use 2 puffs as directed every 4-6 hours as needed for asthma 1 each 12  . aspirin  81 mg EC tablet Take 1 tablet once a day    . cyanocobalamin  (VITAMIN B12) 1,000 mcg/mL injection Inject 1000 mcg as directed monthly 30 mL 4  . EPINEPHrine (EpiPen) 0.3 mg/0.3 mL injection syringe Inject 0.3 mg into the muscle once as needed (anaphylaxis).    SABRA esomeprazole (NexIUM) 20 mg DR capsule TAKE 1 CAPSULE(20 MG) BY MOUTH DAILY 90 capsule 2  . evolocumab (Repatha SureClick) 140 mg/mL pnij Inject 140 mg under the skin every 14 (fourteen)  days. 2 mL 11  . fluticasone propionate (FLONASE) 50 mcg/spray nasal spray As needed   5  . glucagon (Baqsimi) 3 mg/actuation spry Administer 1 spray into affected nostril(s) as needed. 2 each 11  . glucosamine HCl/chondroitin su (glucosamine-chondroitin) 500-400 mg per capsule Take 1 tablet once a day    . glucose blood (Accu-Chek Guide test strips) test strip 1 each by Not Applicable route. 300 strip 11  . HumaLOG U-100 Insulin  100 unit/mL injection INFUSE VIA INSULIN  PUMP AS DIRECTED UP TO 90 UNITS PER DAY. 90 mL 3  . ibuprofen (MOTRIN) 200 mg tablet Take 1 tablet as directed as needed before exercise    . infusion set for insulin  pump (AutoSoft XC Infusion Set 23) iset Continuous subcutaneous insulin  infusion pump.    . insulin  syringe-needle U-100 0.3 mL 30 gauge x 5/16 syrg 1 each by miscellaneous route 4 (four) times a day as needed. 100 each 11  . MiniMed 670G Insulin  Pump misc Use as directed    . naltrexone (DEPADE) 50 mg tablet Take 50 mg by mouth Once Daily for 30 days. 30 tablet 0  . nebivoloL  (BYSTOLIC ) 10 mg tablet TAKE 1 TABLET BY MOUTH EVERY DAY FOR BLOOD PRESSURE 90 tablet 4  . pancrelipase , Lip-Prot-Amyl, (Creon ) 24,000-76,000 -120,000 unit capsule TAKE 4 CAPSULES BY MOUTH FOUR TIMES DAILY 1,080 capsule 3  . pantoprazole  (PROTONIX ) 40 mg EC tablet TAKE 1 TABLET BY MOUTH EVERY DAY 90 tablet 4  . pen needle, diabetic 32 gauge x 5/32 ndle 1 each by miscellaneous route Once Daily. 30 each 11  . tolnaftate (TINACTIN) 1 % cream Apply  topically.    . valsartan  (DIOVAN ) 320 mg tablet TAKE 1 TABLET BY MOUTH EVERY DAY FOR BLOOD PRESSURE 90 tablet 0   No current facility-administered medications for this visit.    Allergies: Allergies  Allergen Reactions  . Shellfish Containing Products Other (See Comments)    Anaphylaxis.  Has EpiPen in case of recurrence  . Amlodipine Hives, Itching, Rash and Other (See Comments)    May have caused his urticaria on 03/17/14 and on 04/12/14  .  Azilsartan Other (See Comments)    Possibly caused hives (as Edarbyclor) on 06/16/14  . Chlorthalidone Other (See Comments)    Possibly caused hives (as Edarbyclor) on 06/16/14  . Lisinopril Hives, Itching, Rash and Other (See Comments)    May have caused his urticaria on 03/17/14  . Indapamide  Rash    Pruritic rash    Physical Exam There were no vitals taken for this visit. General:  No acute distress, alert and oriented x 3, well nourished, well developed.  Skin: good turgor, normal nail beds  HEENT:  PERRLA, EOMI, no scleral icterus, no cervical lymphadenopathy Cardiovascular: S1S2 normal  Regular rate and rhythm, no rubs or gallops Respiratory:  Clear to auscultation bilaterally,  no wheezes, rales, or rhonchi Abdomen:  Soft, nondistended, nontender, normoactive bowel sounds Extremities:  Warm and well-perfused, no clubbing Neuro: normal Rectal: not performed  Labs: Lab Results  Component Value Date   ALT 87 (H) 07/03/2022   AST 84 (H) 07/03/2022   BILITOT 0.6 07/03/2022   Lab Results  Component Value Date   INR 0.97 04/13/2021   PROTIME 10.3 04/13/2021   Lab Results  Component Value Date   CREATININE 0.96 07/03/2022   BUN 12 07/03/2022   NA 135 07/03/2022   K 4.7 07/03/2022   CL 100 07/03/2022   CO2 27 07/03/2022   Lab Results  Component Value Date   WBC 6.1 04/13/2021   HGB 12.7 (L) 04/13/2021   HCT 37.3 (L) 04/13/2021   MCV 84.7 04/13/2021   PLT 198 04/13/2021     Assessment and Plan: 62 year old male  with 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 His most recent LFTs at the OSH were abnormal. Recent CT scan showed intrahepatic biliary ductal dilation with pneumobilia. Imaging findings and labs are concerning for biliary  anastomotic stricture.   Obtain LFTs today Follow-up results of MRCP - scheduled for Wednesday 10/17/22   Further intervention to be determined after reviewing MR and LFT results   Justus Medal MD, [facility] Professor of Medicine  Division of Gastroenterology [facility]

## 2022-10-11 DIAGNOSIS — C44722 Squamous cell carcinoma of skin of right lower limb, including hip: Secondary | ICD-10-CM | POA: Diagnosis not present

## 2022-10-11 DIAGNOSIS — D485 Neoplasm of uncertain behavior of skin: Secondary | ICD-10-CM | POA: Diagnosis not present

## 2022-10-11 DIAGNOSIS — L309 Dermatitis, unspecified: Secondary | ICD-10-CM | POA: Diagnosis not present

## 2022-10-16 DIAGNOSIS — Z9641 Presence of insulin pump (external) (internal): Secondary | ICD-10-CM | POA: Diagnosis not present

## 2022-10-16 DIAGNOSIS — E109 Type 1 diabetes mellitus without complications: Secondary | ICD-10-CM | POA: Diagnosis not present

## 2022-10-16 DIAGNOSIS — E1065 Type 1 diabetes mellitus with hyperglycemia: Secondary | ICD-10-CM | POA: Diagnosis not present

## 2022-10-17 DIAGNOSIS — Z90411 Acquired partial absence of pancreas: Secondary | ICD-10-CM | POA: Diagnosis not present

## 2022-10-17 DIAGNOSIS — K760 Fatty (change of) liver, not elsewhere classified: Secondary | ICD-10-CM | POA: Diagnosis not present

## 2022-10-17 DIAGNOSIS — K831 Obstruction of bile duct: Secondary | ICD-10-CM | POA: Diagnosis not present

## 2022-10-17 DIAGNOSIS — K5909 Other constipation: Secondary | ICD-10-CM | POA: Diagnosis not present

## 2022-10-17 DIAGNOSIS — Z9884 Bariatric surgery status: Secondary | ICD-10-CM | POA: Diagnosis not present

## 2022-10-17 DIAGNOSIS — R932 Abnormal findings on diagnostic imaging of liver and biliary tract: Secondary | ICD-10-CM | POA: Diagnosis not present

## 2022-10-17 DIAGNOSIS — Z9049 Acquired absence of other specified parts of digestive tract: Secondary | ICD-10-CM | POA: Diagnosis not present

## 2022-11-06 DIAGNOSIS — Z9641 Presence of insulin pump (external) (internal): Secondary | ICD-10-CM | POA: Diagnosis not present

## 2022-11-06 DIAGNOSIS — E10649 Type 1 diabetes mellitus with hypoglycemia without coma: Secondary | ICD-10-CM | POA: Diagnosis not present

## 2022-11-06 DIAGNOSIS — E1065 Type 1 diabetes mellitus with hyperglycemia: Secondary | ICD-10-CM | POA: Diagnosis not present

## 2022-11-06 DIAGNOSIS — I129 Hypertensive chronic kidney disease with stage 1 through stage 4 chronic kidney disease, or unspecified chronic kidney disease: Secondary | ICD-10-CM | POA: Diagnosis not present

## 2022-11-06 DIAGNOSIS — E1022 Type 1 diabetes mellitus with diabetic chronic kidney disease: Secondary | ICD-10-CM | POA: Diagnosis not present

## 2022-11-07 NOTE — Progress Notes (Signed)
 I called and spoke with the patient and discussed MR results with him. Will refer him to [facility] surgery for further evaluation and repeat LFTs in 2 weeks. Patient in agreement with the plan.

## 2022-11-08 DIAGNOSIS — Z9641 Presence of insulin pump (external) (internal): Secondary | ICD-10-CM | POA: Diagnosis not present

## 2022-11-08 DIAGNOSIS — E109 Type 1 diabetes mellitus without complications: Secondary | ICD-10-CM | POA: Diagnosis not present

## 2022-11-08 DIAGNOSIS — E1065 Type 1 diabetes mellitus with hyperglycemia: Secondary | ICD-10-CM | POA: Diagnosis not present

## 2022-11-14 DIAGNOSIS — R7989 Other specified abnormal findings of blood chemistry: Secondary | ICD-10-CM | POA: Diagnosis not present

## 2022-11-22 DIAGNOSIS — I87303 Chronic venous hypertension (idiopathic) without complications of bilateral lower extremity: Secondary | ICD-10-CM | POA: Diagnosis not present

## 2022-11-22 DIAGNOSIS — L309 Dermatitis, unspecified: Secondary | ICD-10-CM | POA: Diagnosis not present

## 2022-11-29 ENCOUNTER — Other Ambulatory Visit (HOSPITAL_COMMUNITY): Payer: Self-pay | Admitting: Family Medicine

## 2022-11-29 ENCOUNTER — Ambulatory Visit (HOSPITAL_BASED_OUTPATIENT_CLINIC_OR_DEPARTMENT_OTHER)
Admission: RE | Admit: 2022-11-29 | Discharge: 2022-11-29 | Disposition: A | Discharge status: Home / Self Care | Payer: BC Managed Care – PPO | Source: Ambulatory Visit | Attending: Family Medicine | Admitting: Family Medicine

## 2022-11-29 DIAGNOSIS — R748 Abnormal levels of other serum enzymes: Secondary | ICD-10-CM | POA: Diagnosis not present

## 2022-11-29 DIAGNOSIS — R1012 Left upper quadrant pain: Secondary | ICD-10-CM | POA: Insufficient documentation

## 2022-11-29 DIAGNOSIS — E559 Vitamin D deficiency, unspecified: Secondary | ICD-10-CM | POA: Diagnosis not present

## 2022-11-29 DIAGNOSIS — Z125 Encounter for screening for malignant neoplasm of prostate: Secondary | ICD-10-CM | POA: Diagnosis not present

## 2022-11-29 DIAGNOSIS — E7800 Pure hypercholesterolemia, unspecified: Secondary | ICD-10-CM | POA: Diagnosis not present

## 2022-11-29 MED ORDER — IOHEXOL 300 MG/ML  SOLN
100.0000 mL | Freq: Once | INTRAMUSCULAR | Status: AC | PRN
  Administered 2022-11-29: 100 mL via INTRAVENOUS

## 2022-11-30 DIAGNOSIS — Z90411 Acquired partial absence of pancreas: Secondary | ICD-10-CM | POA: Diagnosis not present

## 2022-11-30 DIAGNOSIS — E785 Hyperlipidemia, unspecified: Secondary | ICD-10-CM | POA: Diagnosis not present

## 2022-11-30 DIAGNOSIS — R7989 Other specified abnormal findings of blood chemistry: Secondary | ICD-10-CM | POA: Diagnosis not present

## 2022-11-30 DIAGNOSIS — K831 Obstruction of bile duct: Secondary | ICD-10-CM | POA: Diagnosis not present

## 2022-11-30 DIAGNOSIS — E1022 Type 1 diabetes mellitus with diabetic chronic kidney disease: Secondary | ICD-10-CM | POA: Diagnosis not present

## 2022-11-30 DIAGNOSIS — Z794 Long term (current) use of insulin: Secondary | ICD-10-CM | POA: Diagnosis not present

## 2022-11-30 DIAGNOSIS — Z90410 Acquired total absence of pancreas: Secondary | ICD-10-CM | POA: Diagnosis not present

## 2022-11-30 DIAGNOSIS — E103293 Type 1 diabetes mellitus with mild nonproliferative diabetic retinopathy without macular edema, bilateral: Secondary | ICD-10-CM | POA: Diagnosis not present

## 2022-11-30 DIAGNOSIS — E1069 Type 1 diabetes mellitus with other specified complication: Secondary | ICD-10-CM | POA: Diagnosis not present

## 2022-11-30 DIAGNOSIS — G4733 Obstructive sleep apnea (adult) (pediatric): Secondary | ICD-10-CM | POA: Diagnosis not present

## 2022-11-30 DIAGNOSIS — I129 Hypertensive chronic kidney disease with stage 1 through stage 4 chronic kidney disease, or unspecified chronic kidney disease: Secondary | ICD-10-CM | POA: Diagnosis not present

## 2022-11-30 DIAGNOSIS — K219 Gastro-esophageal reflux disease without esophagitis: Secondary | ICD-10-CM | POA: Diagnosis not present

## 2022-11-30 DIAGNOSIS — Z79899 Other long term (current) drug therapy: Secondary | ICD-10-CM | POA: Diagnosis not present

## 2022-11-30 DIAGNOSIS — N182 Chronic kidney disease, stage 2 (mild): Secondary | ICD-10-CM | POA: Diagnosis not present

## 2022-11-30 DIAGNOSIS — Z9641 Presence of insulin pump (external) (internal): Secondary | ICD-10-CM | POA: Diagnosis not present

## 2022-11-30 DIAGNOSIS — J45909 Unspecified asthma, uncomplicated: Secondary | ICD-10-CM | POA: Diagnosis not present

## 2022-11-30 DIAGNOSIS — Z8572 Personal history of non-Hodgkin lymphomas: Secondary | ICD-10-CM | POA: Diagnosis not present

## 2022-11-30 DIAGNOSIS — Z8507 Personal history of malignant neoplasm of pancreas: Secondary | ICD-10-CM | POA: Diagnosis not present

## 2022-12-06 DIAGNOSIS — C44722 Squamous cell carcinoma of skin of right lower limb, including hip: Secondary | ICD-10-CM | POA: Diagnosis not present

## 2022-12-06 DIAGNOSIS — Z1212 Encounter for screening for malignant neoplasm of rectum: Secondary | ICD-10-CM | POA: Diagnosis not present

## 2022-12-07 DIAGNOSIS — I10 Essential (primary) hypertension: Secondary | ICD-10-CM | POA: Diagnosis not present

## 2022-12-07 DIAGNOSIS — E109 Type 1 diabetes mellitus without complications: Secondary | ICD-10-CM | POA: Diagnosis not present

## 2022-12-07 DIAGNOSIS — E538 Deficiency of other specified B group vitamins: Secondary | ICD-10-CM | POA: Diagnosis not present

## 2022-12-07 DIAGNOSIS — Z1331 Encounter for screening for depression: Secondary | ICD-10-CM | POA: Diagnosis not present

## 2022-12-07 DIAGNOSIS — R82998 Other abnormal findings in urine: Secondary | ICD-10-CM | POA: Diagnosis not present

## 2022-12-07 DIAGNOSIS — Z0000 Encounter for general adult medical examination without abnormal findings: Secondary | ICD-10-CM | POA: Diagnosis not present

## 2022-12-07 DIAGNOSIS — Z1339 Encounter for screening examination for other mental health and behavioral disorders: Secondary | ICD-10-CM | POA: Diagnosis not present

## 2022-12-09 DIAGNOSIS — E109 Type 1 diabetes mellitus without complications: Secondary | ICD-10-CM | POA: Diagnosis not present

## 2022-12-09 DIAGNOSIS — Z9641 Presence of insulin pump (external) (internal): Secondary | ICD-10-CM | POA: Diagnosis not present

## 2022-12-09 DIAGNOSIS — E1065 Type 1 diabetes mellitus with hyperglycemia: Secondary | ICD-10-CM | POA: Diagnosis not present

## 2022-12-19 DIAGNOSIS — K831 Obstruction of bile duct: Secondary | ICD-10-CM | POA: Diagnosis not present

## 2023-01-04 IMAGING — DX DG CHEST 1V PORT
1 series · 1 of 1 positions shown · non-contrast
Comparison: Chest CT 07/21/2012

CLINICAL DATA: Infection evaluation.

EXAM:
PORTABLE CHEST 1 VIEW

[chest]
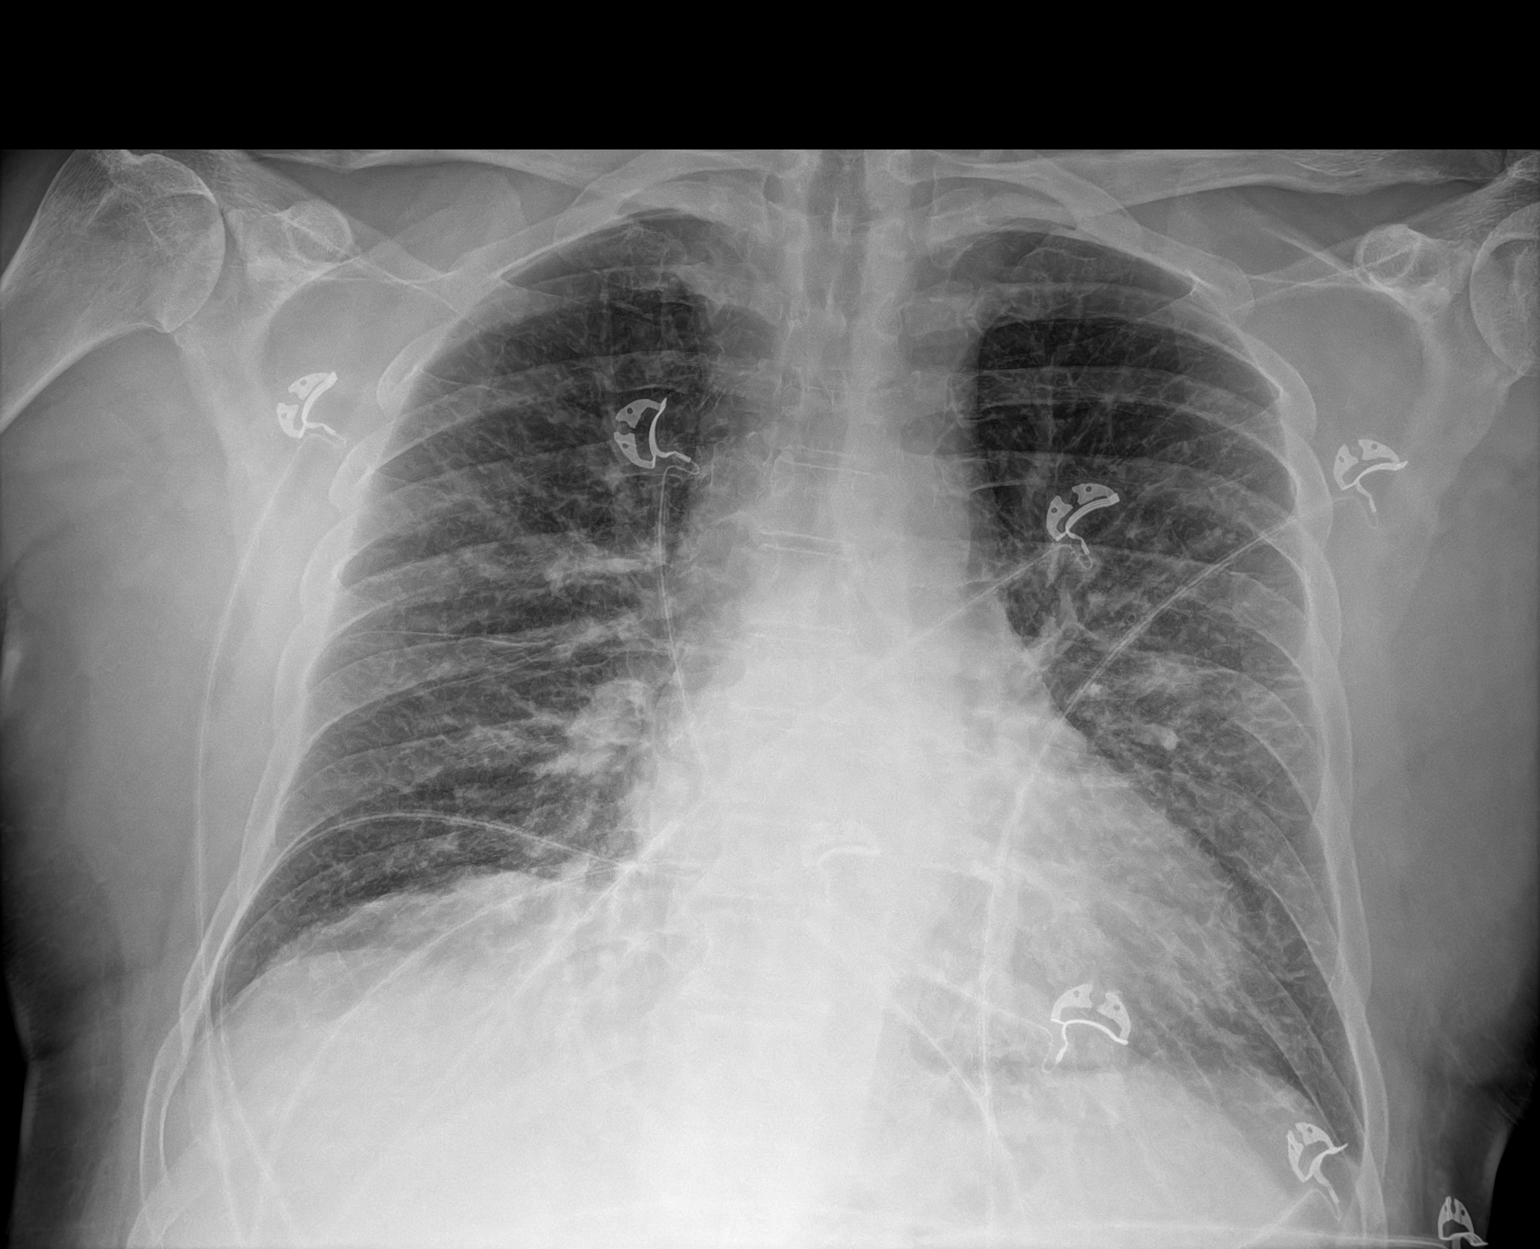

[1 of 1 positions shown; findings below may reference images not displayed]

FINDINGS: The cardiac silhouette is normal in size. There are mild
interstitial densities bilaterally most notable in the peripheral
left mid and lower lung. No consolidative airspace opacity, pleural
effusion, or pneumothorax is identified. No acute osseous
abnormality is seen.
IMPRESSION: Mild interstitial densities in the left greater than right lungs
which could reflect atypical/viral infection or early edema.

## 2023-01-05 IMAGING — US US ABDOMEN LIMITED
1 series · 14 of 25 positions shown · non-contrast
Comparison: 03/21/2021.

CLINICAL DATA: Elevated liver enzymes. Status post Whipple
procedure.

EXAM:
ULTRASOUND ABDOMEN LIMITED RIGHT UPPER QUADRANT

[Series 1: us abdomen limited ruq (liver/gb) · 14 of 29 slices shown]
[im 1/29]
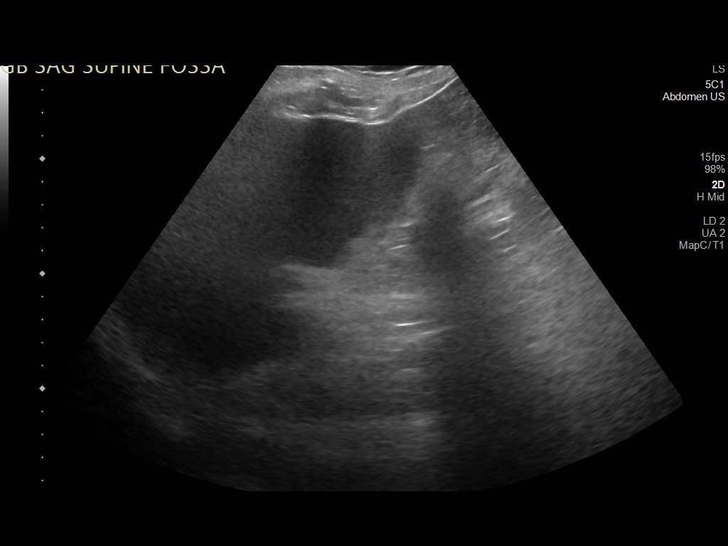
[im 3/29]
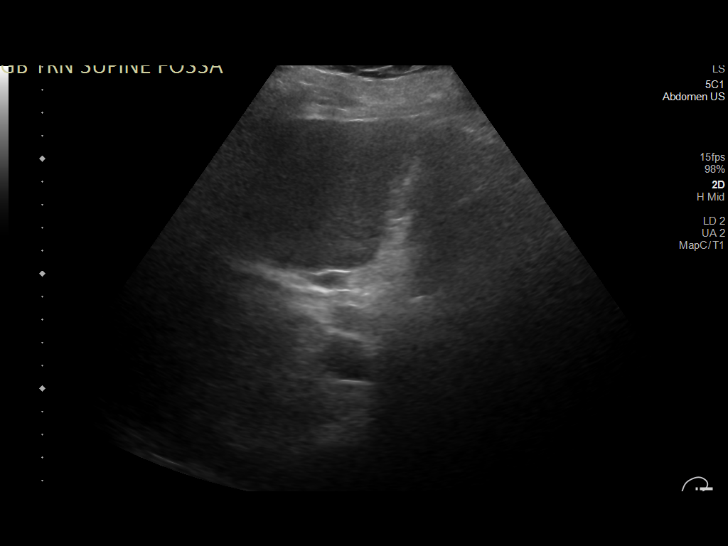
[im 5/29]
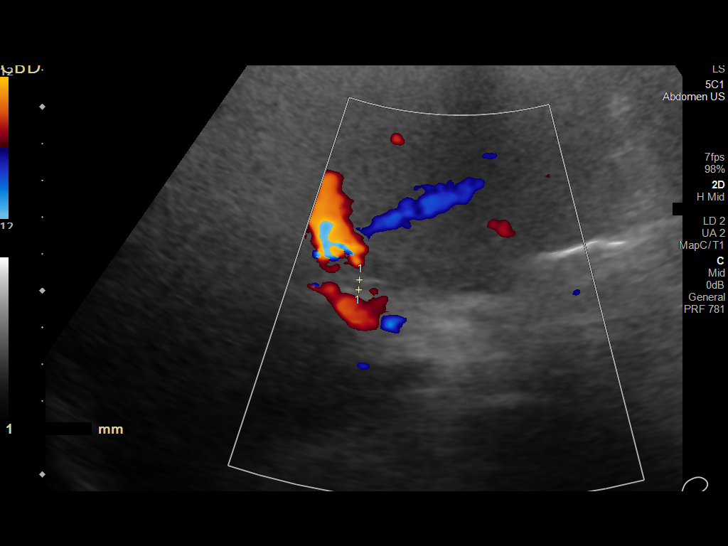
[im 8/29]
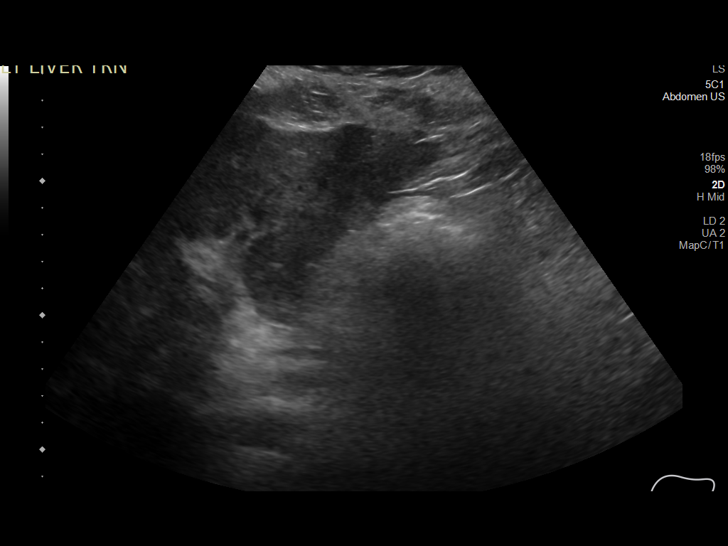
[im 10/29]
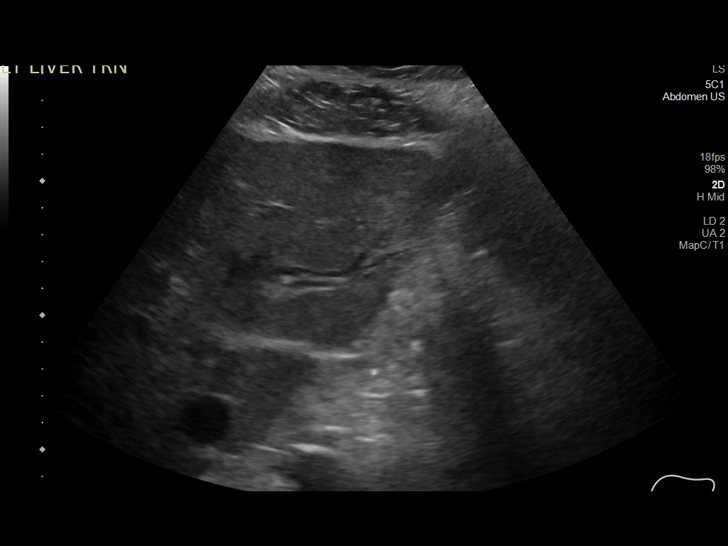
[im 11/29]
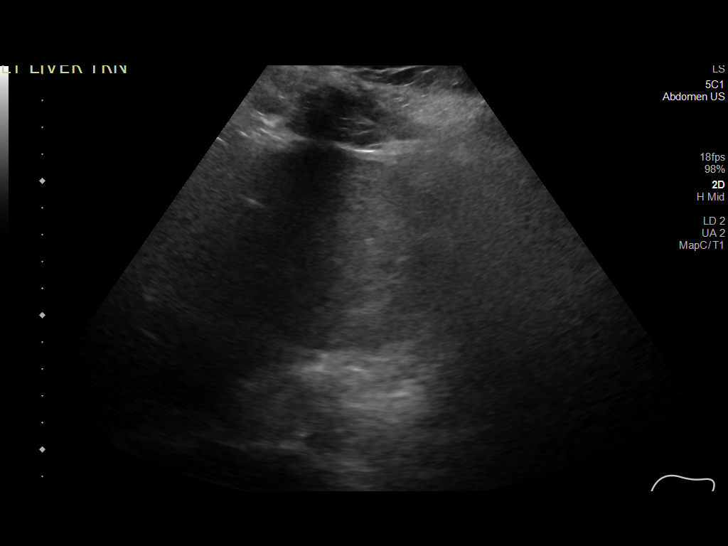
[im 13/29]
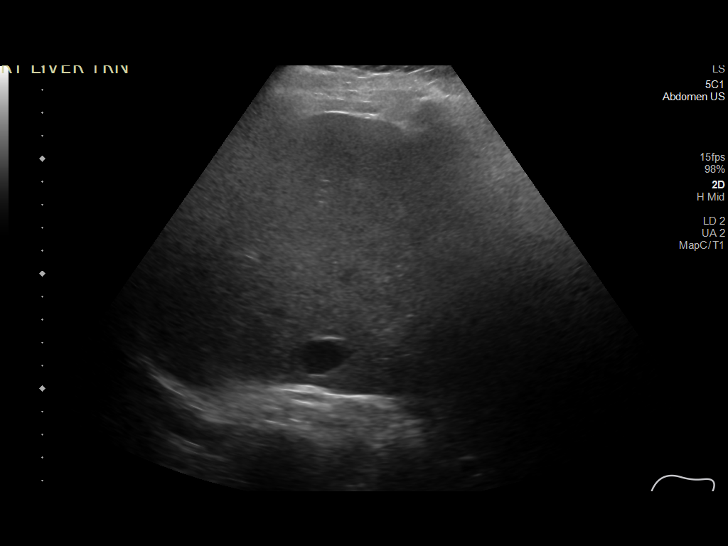
[im 16/29]
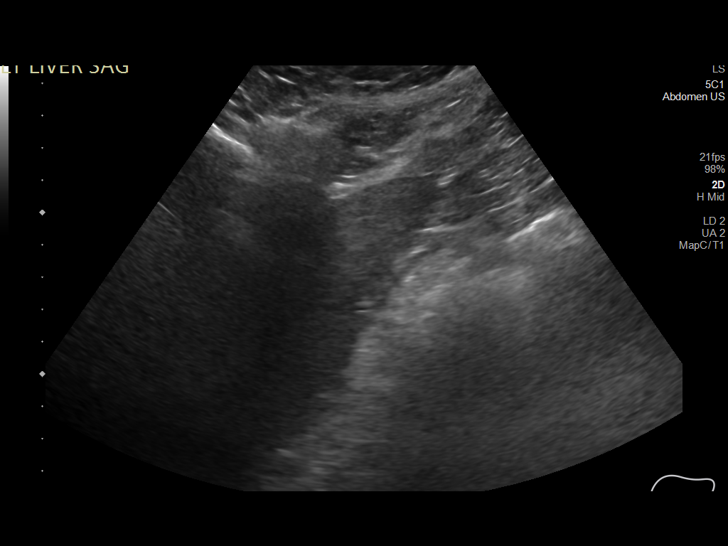
[im 18/29]
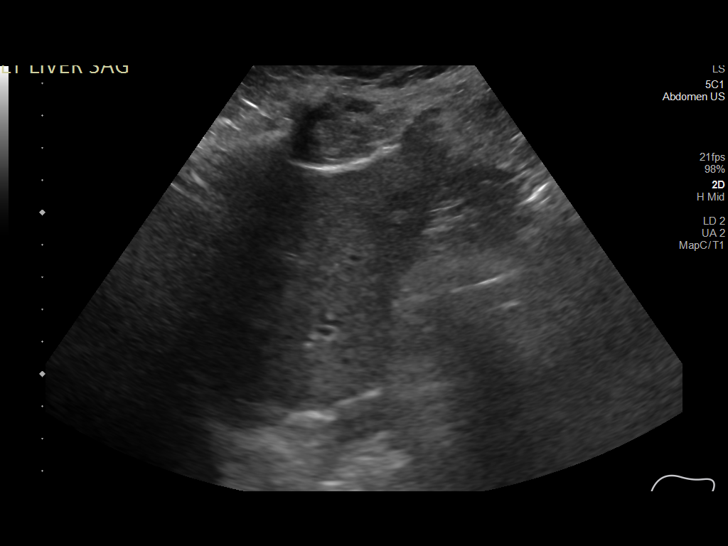
[im 19/29]
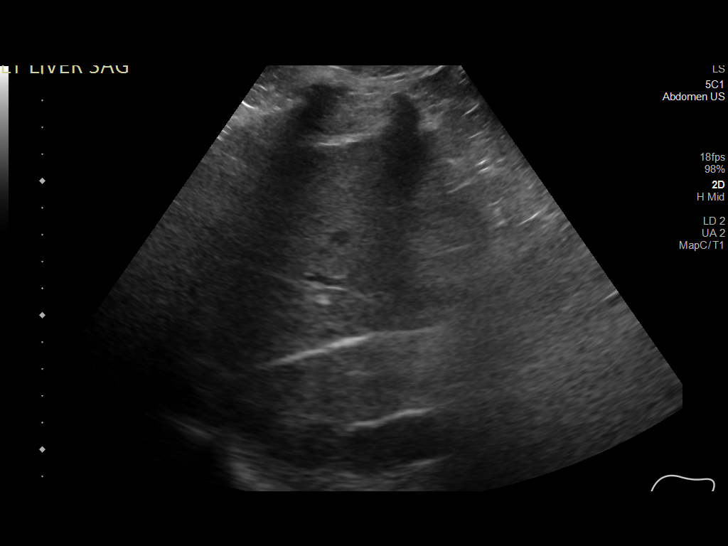
[im 22/29]
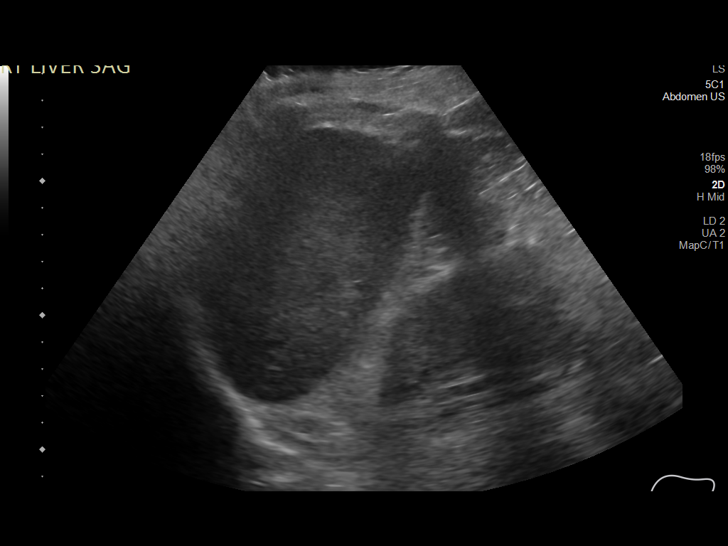
[im 24/29]
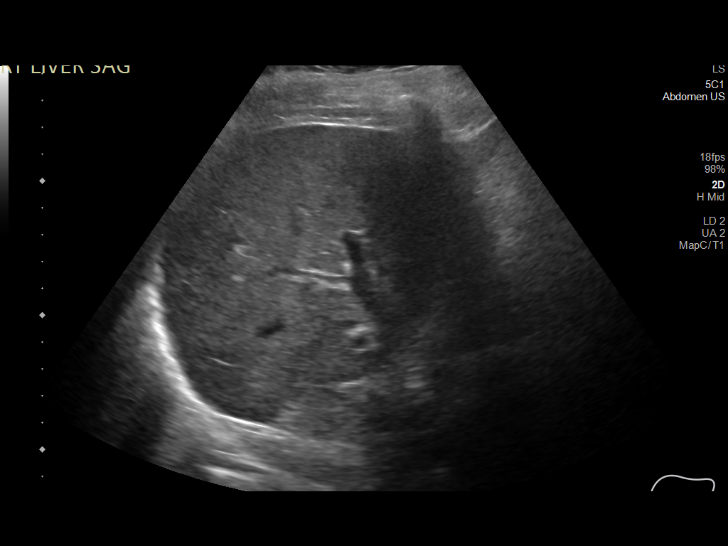
[im 26/29]
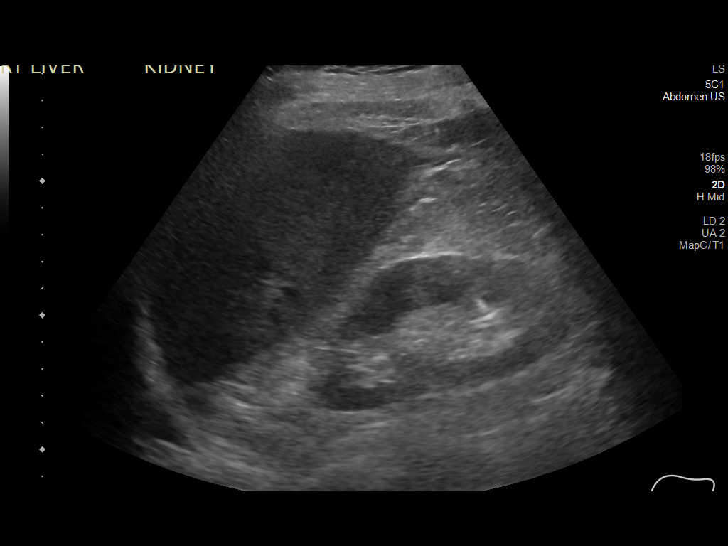
[im 29/29]
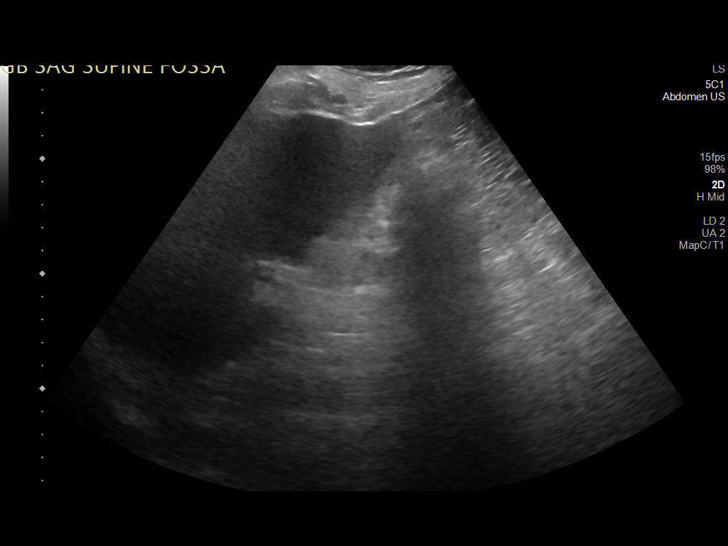

[14 of 25 positions shown; findings below may reference images not displayed]

FINDINGS: Gallbladder:

Surgically absent.

Common bile duct:

Diameter: 2.7 mm

Liver:

No focal lesion identified. Within normal limits in parenchymal
echogenicity. Portal vein is patent on color Doppler imaging with
normal direction of blood flow towards the liver.

Other: No free fluid.
IMPRESSION: 1. No acute abnormality.
2. Status post cholecystectomy.

## 2023-01-08 DIAGNOSIS — E1065 Type 1 diabetes mellitus with hyperglycemia: Secondary | ICD-10-CM | POA: Diagnosis not present

## 2023-01-08 DIAGNOSIS — E109 Type 1 diabetes mellitus without complications: Secondary | ICD-10-CM | POA: Diagnosis not present

## 2023-01-08 DIAGNOSIS — Z9641 Presence of insulin pump (external) (internal): Secondary | ICD-10-CM | POA: Diagnosis not present

## 2023-01-15 DIAGNOSIS — K9189 Other postprocedural complications and disorders of digestive system: Secondary | ICD-10-CM | POA: Diagnosis not present

## 2023-01-15 NOTE — Progress Notes (Signed)
 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 Had ERCP for distal biliary stricture 01/2002 followed by Whipple 02/18/02  Developed anastomotic stricture s/p IR placement of PBD 07/2012 with resolution of stricture 10/2012 ([facility]).  EUS 07/2012 (Dr. Burnette): Small HH, patent Schatzki's ring, moderate LA grade B/C esophagitis. Prior pancreaticoduodenectomy with healthy anastomosis. EUS with grossly normal pancreatic remnant. Intrahepatic biliary dilation.  10/03/2018 MRCP: Minimal intrahepatic biliary ductal dilatation with some irregular beading appearance of the left hepatic duct and the common hepatic duct at the hilar confluence. 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 Postsurgical Whipple changes.  Colonoscopy 07/2017 (Dr. Luis): Normal. Recommended repeat in 5 years due to history of polyps  Hospital admission 03/2021 for Klebsiella and E. coli bacteremia and transient pSBO treated with vancomycin  and Zosyn  and bowel rest. No surgery. Inpatient GI team consulted for elevated liver enzymes which were attributed to cholestasis of sepsis and were downtrending at the time of discharge.  03/21/2021 CT: evidence of prior ccy, Whipple. Pneumobilia but otherwise normal-appearing liver. Atrophic remaining pancreas without PD dilation. Focal dilation of the small bowel in the RUQ adjacent to the anastomosis. Normal-appearing colon. Small to moderate size left inguinal hernia containing portions of the bladder  03/22/2021 RUQ US : ccy, normal-appearing liver with 2.7 mm CBD. Normal Dopplers  04/20/2021: MRCP: Stable mild to moderate intrahepatic biliary ductal dilatation without obstructing  lesion. Postsurgical Whipple changes. Hospital admission 12/21-12/23/23 with probable ascending cholangitis, small hepatic abscesses, and suspected recently passed stone. Treated with IV abx. CEA normal. CA 19-9 102. Cultures were negative.  05/03/2022: CT A/P: Whipple anatomy, fluid-filled esophagus c/w GERD or dysmotility, progressed general intrahepatic biliary duct dilation from 1 year prior  05/05/2022: MRCP: Asymmetric arterial enhancement in the left lobe of the liver suspicious for foci of small abscesses, morphologic features suggestive of early cirrhosis, moderate intrahepatic duct dilatation unchanged from previous imaging. Cannot exclude underlying stricture at choledochojejunostomy site. Whipple anatomy. Recommended repeat MRI in 3 months Seen by GI at [facility] 05/17/22. Labs on 05/15/22 with TB 2, AP 1144, AST 132, ALT 88   Seen in clinic 07/03/22 (referred from [facility]) for evaluation of biliary ductal dilation s/p prior Whipple and consideration for EUS/ERCP. Patient was admitted in December 2023 with abnormal imaging and elevated LFTs. He has done well since then with no complaints of fatigue, abdominal pain, nausea, vomiting, or jaundice. Planned to repeat LFTs along with MRCP for further evaluation of anastomotic biliary stricture   07/03/22: TB 0.6, AP 396, AST 84, ALT 87   Patient had an ED visit to [facility] for a near syncopal event while at work, associated with left upper quadrant abdominal pain. Denied any shortness of breath or chest pain; EKG showed a sinus rhythm with a left bundle branch block. Suspect possible vagal event   CT a/p W/ 10/04/22: No suspicious focal abnormality within the liver parenchyma. Intrahepatic biliary duct dilatation again noted, similar to prior. Gallbladder surgically absent. Pneumobilia compatible with the history of biliary enteric anastomosis. Focal small bowel wall thickening is seen in the anterior abdomen just cranial to the umbilicus, containing  fecalized small bowel contents. This is in close proximity to a staple line and appears to represent small bowel  anastomosis on coronal imaging (29/7) suggesting atony from small bowel anastomosis. Similar findings noted on the previous exam. Status post Whipple procedure. Left groin hernia containing a portion of the anterior left bladder, similar to prior and without complicating features today.   Seen in clinic 10/09/22 for evaluation of biliary stricture  MRCP 10/17/22: Status post Whipple. Stable mild intrahepatic biliary prominence as compared to the previous studies. Query small choledocholith at the choledochojejunostomy measuring 5 mm with associated irregularity of the distal CBD (series 4, image 28). Given postsurgical changes and susceptibility, blooming artifact may present similarly. Focally dilated loop of small bowel in the mid abdomen which is partially imaged has progressively increased in size over multiple subsequent exams without resultant proximal small bowel obstruction. No evidence of obstruction or inflammation of the biliary limb at this time.  Referred to HPB surgery - admitted from clinic due to significant weakness, concern for cholangitis and need for PTC.  Admission 7/19-7/22/24 for biliary anastomotic stricture with weakness. Admission labs notable for elevated LFTs with TB 6, Alk Phos 579, total bilirubin 6; no leukocytosis.  7/20, IR perc PTBD. The PTC demonstrated moderate to severe intrahepatic biliary duct dilation and anastomotic stricture, s/p 10 Fr drain placement  HPB clinic 12/19/22; doing well with drain in place (capped).  LFTs done on the day showed a total bilirubin of 1.4, alk phos 770, AST 114, ALT 97.  No complaints today  ROS:  A complete review of systems was otherwise negative, except as noted in the HPI.  Past Medical History: Past Medical History:  Diagnosis Date  . Ankle arthritis    posttraumatic degenerative   . Asthma, mild    mild,  quiescent since childhood. Hoistory of minial symptoms ( e.g. with dog exposure).  Aytec as needed (rare).  . Dupuytren's contracture   . Erectile dysfunction   . Jaundice    obstructive, with nearly complete obstruction in the region of the prior whipple anastomosis  . PSA elevation   . Shellfish allergy    once self -administered EpiPen   . Snoring    mild to moderate sleep apena   . Vitamin D deficiency   . 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 Marital status: Single    Spouse name: None  . Number of children: None  . Years of education: None  . Highest education level: None  Occupational History  . None  Tobacco Use  . Smoking status: Never  . Smokeless tobacco: Never  Substance and Sexual Activity  . Alcohol  use: Yes    Alcohol /week: 40.0 standard drinks of alcohol   . Drug use: Not Currently  . Sexual activity: None  Other Topics Concern  . None  Social History Narrative  . None   Social Determinants of Health   Food Insecurity: Patient Declined (05/03/2022)   Received [facility]   Food vital sign   . Within the past 12 months, you worried that your food would run out before you got money to buy more: Patient declined   . Within the past 12 months, the food you bought just didn't last and you didn't have money to get more: Patient declined  Transportation Needs: Patient Declined (05/03/2022)   Received from [facility]   [facility] - Transportation   . Lack of Transportation (Medical): Patient declined   . Lack of  Transportation (Non-Medical): Patient declined  Safety: Patient Declined (05/03/2022)   Received from [facility]   Safety   . Within the last year, have you been afraid of your partner or ex-partner?: Patient declined   . Within the last year, have you been humiliated or emotionally abused in other ways by  your partner or ex-partner?: Patient declined   . Within the last year, have you been kicked, hit, slapped, or otherwise physically hurt by your partner or ex-partner?: Patient declined   . Within the last year, have you been raped or forced to have any kind of sexual activity by your partner or ex-partner?: Patient declined  Living Situation: Not on file    Family History: family history includes Parkinsonism in his father.  Medications: Current Outpatient Medications  Medication Sig Dispense Refill  . Accu-Chek Aviva Plus Meter misc USE TO TEST BLOOD SUGAR AS DIRECTED 1 each 2  . albuterol  HFA (PROVENTIL  HFA;VENTOLIN  HFA;PROAIR  HFA) 90 mcg/actuation inhaler Use 2 puffs as directed every 4-6 hours as needed for asthma 1 each 12  . aspirin  81 mg EC tablet Take 1 tablet once a day    . EPINEPHrine (EpiPen) 0.3 mg/0.3 mL injection syringe Inject 0.3 mg into the muscle once as needed (anaphylaxis).    SABRA ergocalciferol (VITAMIN D2) 1,250 mcg (50,000 unit) capsule Take 50,000 Units by mouth once a week.    . esomeprazole (NexIUM) 20 mg DR capsule TAKE 1 CAPSULE(20 MG) BY MOUTH DAILY 90 capsule 2  . glucagon (Baqsimi) 3 mg/actuation spry Administer 1 spray into affected nostril(s) as needed. 2 each 11  . glucosamine HCl/chondroitin su (glucosamine-chondroitin) 500-400 mg per capsule Take 1 tablet once a day    . glucose blood (Accu-Chek Guide test strips) test strip 1 each by Not Applicable route. 300 strip 11  . HumaLOG U-100 Insulin  100 unit/mL injection INFUSE VIA INSULIN  PUMP AS DIRECTED UP TO 90 UNITS PER DAY. 90 mL 3  . ibuprofen (MOTRIN) 200 mg tablet Take 1 tablet as directed as needed before exercise    . infusion set for insulin  pump (AutoSoft XC Infusion Set 23) iset Continuous subcutaneous insulin  infusion pump.    . insulin  syringe-needle U-100 0.3 mL 30 gauge x 5/16 syrg 1 each by miscellaneous route 4 (four) times a day as needed. 100 each 11  . MiniMed 670G Insulin  Pump misc Use  as directed    . nebivoloL  (BYSTOLIC ) 10 mg tablet TAKE 1 TABLET BY MOUTH EVERY DAY FOR BLOOD PRESSURE (Patient taking differently: Take 10 mg by mouth daily.) 90 tablet 4  . pancrelipase , Lip-Prot-Amyl, (Creon ) 24,000-76,000 -120,000 unit capsule TAKE 4 CAPSULES BY MOUTH FOUR TIMES DAILY (Patient taking differently: Take 4 capsules with breakfast, 6 capsules with lunch and 6 capsules with dinner) 1,080 capsule 3  . pantoprazole  (PROTONIX ) 40 mg EC tablet TAKE 1 TABLET BY MOUTH EVERY DAY (Patient taking differently: Take 40 mg by mouth every morning before breakfast.) 90 tablet 4  . pen needle, diabetic 32 gauge x 5/32 ndle 1 each by miscellaneous route Once Daily. 30 each 11  . sodium chloride  0.9 % syringe (Normal Saline Flush) injection Infuse 10 mL into a venous catheter 2 (two) times a day. 1200 mL 0  . valsartan  (DIOVAN ) 320 mg tablet TAKE 1 TABLET BY MOUTH EVERY DAY FOR BLOOD PRESSURE 90 tablet 0  . cefdinir  (OMNICEF ) 300 mg capsule Take 300 mg by mouth 2 (two) times a day.    . cyanocobalamin  (VITAMIN B12) 1,000  mcg/mL injection Inject 1000 mcg as directed monthly (Patient not taking: Reported on 01/15/2023) 30 mL 4  . evolocumab (Repatha SureClick) 140 mg/mL pnij Inject 140 mg under the skin every 14 (fourteen) days. (Patient not taking: Reported on 11/30/2022) 2 mL 11  . naltrexone (DEPADE) 50 mg tablet Take 50 mg by mouth Once Daily for 30 days. (Patient not taking: Reported on 11/30/2022) 30 tablet 0   No current facility-administered medications for this visit.    Allergies: Allergies  Allergen Reactions  . Shellfish Containing Products Other (See Comments)    Anaphylaxis.  Has EpiPen in case of recurrence  . Amlodipine Hives, Itching, Rash and Other (See Comments)    May have caused his urticaria on 03/17/14 and on 04/12/14  . Azilsartan Other (See Comments)    Possibly caused hives (as Edarbyclor) on 06/16/14  . Chlorthalidone Other (See Comments)    Possibly caused hives (as  Edarbyclor) on 06/16/14  . Lisinopril Hives, Itching, Rash and Other (See Comments)    May have caused his urticaria on 03/17/14  . Indapamide  Rash    Pruritic rash    Physical Exam Blood pressure (!) 179/66, pulse 61, temperature 97.5 F (36.4 C), temperature source Temporal, height 1.753 m (5' 9), weight 91.8 kg (202 lb 6.4 oz), SpO2 100%. General:  No acute distress, alert and oriented x 3, well nourished, well developed.  Skin: good turgor, normal nail beds  HEENT:  PERRLA, EOMI, no scleral icterus, no lymphadenopathy, thyroid  not enlarged Cardiovascular: S1S2 normal  Regular rate and rhythm, no murmurs, rubs or gallops Respiratory:  Clear to auscultation bilaterally, no wheezes, rales, or rhonchi Abdomen:  Soft, nondistended, nontender, normoactive bowel sounds, no hepatomegaly, no splenomegaly. Femoral pulses normal. Extremities:  Warm and well-perfused, no clubbing, cyanosis, or edema Neuro: normal Rectal: not performed  Labs: Lab Results  Component Value Date   ALT 97 (H) 12/19/2022   AST 114 (H) 12/19/2022   BILITOT 1.4 (H) 12/19/2022   Lab Results  Component Value Date   INR 0.97 04/13/2021   PROTIME 10.3 04/13/2021   Lab Results  Component Value Date   CREATININE 0.93 12/19/2022   BUN 13 12/19/2022   NA 133 (L) 12/19/2022   K 4.3 12/19/2022   CL 100 12/19/2022   CO2 29 12/19/2022   Lab Results  Component Value Date   WBC 6.30 12/19/2022   HGB 12.1 (L) 12/19/2022   HCT 34.9 (L) 12/19/2022   MCV 86.6 12/19/2022   PLT 192 12/19/2022     Assessment and Plan: 62 year old gentleman with medical history of B-cell lymphoma in the pancreas, status post Whipple in 2003 with chemotherapy and radiation came in for follow-up.  Patient has subsequently developed a hepaticojejunostomy anastomotic stricture with biliary obstruction, status post IR guided drainage.  He continues to do well with no complaints.  Recommend close follow-up with IR and HPB surgery for further  management.  No GI intervention needed at this time.  Plan discussed with patient in detail and he demonstrated adequate understanding   Justus Medal MD, [facility] Professor of Medicine  Division of Gastroenterology [facility] of Medicine

## 2023-02-08 DIAGNOSIS — E10649 Type 1 diabetes mellitus with hypoglycemia without coma: Secondary | ICD-10-CM | POA: Diagnosis not present

## 2023-02-08 DIAGNOSIS — E1065 Type 1 diabetes mellitus with hyperglycemia: Secondary | ICD-10-CM | POA: Diagnosis not present

## 2023-02-08 DIAGNOSIS — E103293 Type 1 diabetes mellitus with mild nonproliferative diabetic retinopathy without macular edema, bilateral: Secondary | ICD-10-CM | POA: Diagnosis not present

## 2023-02-08 DIAGNOSIS — E1022 Type 1 diabetes mellitus with diabetic chronic kidney disease: Secondary | ICD-10-CM | POA: Diagnosis not present

## 2023-02-14 DIAGNOSIS — I129 Hypertensive chronic kidney disease with stage 1 through stage 4 chronic kidney disease, or unspecified chronic kidney disease: Secondary | ICD-10-CM | POA: Diagnosis not present

## 2023-02-14 DIAGNOSIS — N182 Chronic kidney disease, stage 2 (mild): Secondary | ICD-10-CM | POA: Diagnosis not present

## 2023-02-14 DIAGNOSIS — E559 Vitamin D deficiency, unspecified: Secondary | ICD-10-CM | POA: Diagnosis not present

## 2023-02-14 DIAGNOSIS — K831 Obstruction of bile duct: Secondary | ICD-10-CM | POA: Diagnosis not present

## 2023-02-14 DIAGNOSIS — E103293 Type 1 diabetes mellitus with mild nonproliferative diabetic retinopathy without macular edema, bilateral: Secondary | ICD-10-CM | POA: Diagnosis not present

## 2023-02-14 DIAGNOSIS — E1065 Type 1 diabetes mellitus with hyperglycemia: Secondary | ICD-10-CM | POA: Diagnosis not present

## 2023-02-14 DIAGNOSIS — E1022 Type 1 diabetes mellitus with diabetic chronic kidney disease: Secondary | ICD-10-CM | POA: Diagnosis not present

## 2023-02-14 DIAGNOSIS — Z9641 Presence of insulin pump (external) (internal): Secondary | ICD-10-CM | POA: Diagnosis not present

## 2023-02-14 DIAGNOSIS — Z794 Long term (current) use of insulin: Secondary | ICD-10-CM | POA: Diagnosis not present

## 2023-02-14 DIAGNOSIS — Z4682 Encounter for fitting and adjustment of non-vascular catheter: Secondary | ICD-10-CM | POA: Diagnosis not present

## 2023-02-14 DIAGNOSIS — E10649 Type 1 diabetes mellitus with hypoglycemia without coma: Secondary | ICD-10-CM | POA: Diagnosis not present

## 2023-02-14 DIAGNOSIS — E538 Deficiency of other specified B group vitamins: Secondary | ICD-10-CM | POA: Diagnosis not present

## 2023-02-15 DIAGNOSIS — Z794 Long term (current) use of insulin: Secondary | ICD-10-CM | POA: Diagnosis not present

## 2023-03-08 DIAGNOSIS — E109 Type 1 diabetes mellitus without complications: Secondary | ICD-10-CM | POA: Diagnosis not present

## 2023-03-08 DIAGNOSIS — E222 Syndrome of inappropriate secretion of antidiuretic hormone: Secondary | ICD-10-CM | POA: Diagnosis not present

## 2023-03-08 DIAGNOSIS — R748 Abnormal levels of other serum enzymes: Secondary | ICD-10-CM | POA: Diagnosis not present

## 2023-03-08 DIAGNOSIS — E538 Deficiency of other specified B group vitamins: Secondary | ICD-10-CM | POA: Diagnosis not present

## 2023-03-14 DIAGNOSIS — K831 Obstruction of bile duct: Secondary | ICD-10-CM | POA: Diagnosis not present

## 2023-03-14 DIAGNOSIS — Z4682 Encounter for fitting and adjustment of non-vascular catheter: Secondary | ICD-10-CM | POA: Diagnosis not present

## 2023-05-10 DIAGNOSIS — Z9641 Presence of insulin pump (external) (internal): Secondary | ICD-10-CM | POA: Diagnosis not present

## 2023-05-10 DIAGNOSIS — E1065 Type 1 diabetes mellitus with hyperglycemia: Secondary | ICD-10-CM | POA: Diagnosis not present

## 2023-05-10 DIAGNOSIS — E109 Type 1 diabetes mellitus without complications: Secondary | ICD-10-CM | POA: Diagnosis not present

## 2023-05-23 DIAGNOSIS — L578 Other skin changes due to chronic exposure to nonionizing radiation: Secondary | ICD-10-CM | POA: Diagnosis not present

## 2023-05-23 DIAGNOSIS — R600 Localized edema: Secondary | ICD-10-CM | POA: Diagnosis not present

## 2023-05-23 DIAGNOSIS — T148XXA Other injury of unspecified body region, initial encounter: Secondary | ICD-10-CM | POA: Diagnosis not present

## 2023-05-23 DIAGNOSIS — L309 Dermatitis, unspecified: Secondary | ICD-10-CM | POA: Diagnosis not present

## 2023-06-07 DIAGNOSIS — I10 Essential (primary) hypertension: Secondary | ICD-10-CM | POA: Diagnosis not present

## 2023-06-07 DIAGNOSIS — E109 Type 1 diabetes mellitus without complications: Secondary | ICD-10-CM | POA: Diagnosis not present

## 2023-06-15 ENCOUNTER — Other Ambulatory Visit: Payer: Self-pay | Admitting: Gastroenterology

## 2023-06-18 NOTE — Telephone Encounter (Signed)
Dr Carloyn Manner it looks like he is being seen at [facility] but we got a refill request for Creon is it ok to refill?  Look like 09/2022 last time you gave it to him  Thanks

## 2023-06-27 DIAGNOSIS — E7800 Pure hypercholesterolemia, unspecified: Secondary | ICD-10-CM | POA: Diagnosis not present

## 2023-06-28 ENCOUNTER — Telehealth: Payer: Self-pay | Admitting: Gastroenterology

## 2023-06-28 NOTE — Telephone Encounter (Signed)
Good afternoon Dr. Barron Alvine,    We received a referral for patient for elevated liver enzymes and pancreatic disease. Patient was last seen by you in January of 2024. Patient is now requesting to transfer his care over to Dr. Meridee Score due to primary care provider recommendations. Please review request and advise on scheduling.    Thank you.

## 2023-07-01 ENCOUNTER — Encounter: Payer: Self-pay | Admitting: Gastroenterology

## 2023-07-01 NOTE — Telephone Encounter (Signed)
I will review this request when I return to the office on Friday.

## 2023-07-02 NOTE — Telephone Encounter (Signed)
Review of the patient's chart has been completed. I can see patient in followup if he wishes for opinion. I don't know that we have much more to offer than continued PBDs if there are concerns for recurrent stricutring disease vs an increased risk ERCP for attempt at reaching the area endoscopically, if there remains concerns on updated imaging. If patient would like referral, then let's set him up with the following: Labs - CBC/CMP/AP isoenzymes/GGT Imaging - MRI/MRCP for biliary stricture history and bile duct dilation Clinic visit  Thanks. GM

## 2023-07-08 NOTE — Telephone Encounter (Signed)
Patient scheduled for 4/25

## 2023-07-11 DIAGNOSIS — Z9049 Acquired absence of other specified parts of digestive tract: Secondary | ICD-10-CM | POA: Diagnosis not present

## 2023-07-11 DIAGNOSIS — K831 Obstruction of bile duct: Secondary | ICD-10-CM | POA: Diagnosis not present

## 2023-07-11 DIAGNOSIS — Z90410 Acquired total absence of pancreas: Secondary | ICD-10-CM | POA: Diagnosis not present

## 2023-08-22 DIAGNOSIS — E119 Type 2 diabetes mellitus without complications: Secondary | ICD-10-CM | POA: Diagnosis not present

## 2023-08-22 DIAGNOSIS — H5203 Hypermetropia, bilateral: Secondary | ICD-10-CM | POA: Diagnosis not present

## 2023-08-22 DIAGNOSIS — H25813 Combined forms of age-related cataract, bilateral: Secondary | ICD-10-CM | POA: Diagnosis not present

## 2023-09-06 ENCOUNTER — Ambulatory Visit: Payer: BC Managed Care – PPO | Admitting: Gastroenterology

## 2023-09-12 DIAGNOSIS — E109 Type 1 diabetes mellitus without complications: Secondary | ICD-10-CM | POA: Diagnosis not present

## 2023-09-26 DIAGNOSIS — E109 Type 1 diabetes mellitus without complications: Secondary | ICD-10-CM | POA: Diagnosis not present

## 2023-09-26 DIAGNOSIS — I10 Essential (primary) hypertension: Secondary | ICD-10-CM | POA: Diagnosis not present

## 2023-10-03 DIAGNOSIS — Z9641 Presence of insulin pump (external) (internal): Secondary | ICD-10-CM | POA: Diagnosis not present

## 2023-10-03 DIAGNOSIS — E1065 Type 1 diabetes mellitus with hyperglycemia: Secondary | ICD-10-CM | POA: Diagnosis not present

## 2023-10-03 DIAGNOSIS — Z794 Long term (current) use of insulin: Secondary | ICD-10-CM | POA: Diagnosis not present

## 2023-10-03 DIAGNOSIS — E109 Type 1 diabetes mellitus without complications: Secondary | ICD-10-CM | POA: Diagnosis not present

## 2023-10-09 DIAGNOSIS — Z9641 Presence of insulin pump (external) (internal): Secondary | ICD-10-CM | POA: Diagnosis not present

## 2023-10-09 DIAGNOSIS — E1065 Type 1 diabetes mellitus with hyperglycemia: Secondary | ICD-10-CM | POA: Diagnosis not present

## 2023-10-09 DIAGNOSIS — E109 Type 1 diabetes mellitus without complications: Secondary | ICD-10-CM | POA: Diagnosis not present

## 2023-10-22 DIAGNOSIS — L309 Dermatitis, unspecified: Secondary | ICD-10-CM | POA: Diagnosis not present

## 2023-10-31 DIAGNOSIS — Z9641 Presence of insulin pump (external) (internal): Secondary | ICD-10-CM | POA: Diagnosis not present

## 2023-10-31 DIAGNOSIS — E1065 Type 1 diabetes mellitus with hyperglycemia: Secondary | ICD-10-CM | POA: Diagnosis not present

## 2023-10-31 DIAGNOSIS — E109 Type 1 diabetes mellitus without complications: Secondary | ICD-10-CM | POA: Diagnosis not present

## 2023-10-31 DIAGNOSIS — L209 Atopic dermatitis, unspecified: Secondary | ICD-10-CM | POA: Diagnosis not present

## 2023-10-31 DIAGNOSIS — Z794 Long term (current) use of insulin: Secondary | ICD-10-CM | POA: Diagnosis not present

## 2023-10-31 DIAGNOSIS — L299 Pruritus, unspecified: Secondary | ICD-10-CM | POA: Diagnosis not present

## 2023-11-27 ENCOUNTER — Encounter: Payer: Self-pay | Admitting: Gastroenterology

## 2023-11-27 ENCOUNTER — Other Ambulatory Visit (INDEPENDENT_AMBULATORY_CARE_PROVIDER_SITE_OTHER)

## 2023-11-27 ENCOUNTER — Ambulatory Visit: Admitting: Gastroenterology

## 2023-11-27 VITALS — BP 138/60 | HR 64 | Ht 69.0 in | Wt 200.0 lb

## 2023-11-27 DIAGNOSIS — R7989 Other specified abnormal findings of blood chemistry: Secondary | ICD-10-CM

## 2023-11-27 DIAGNOSIS — Z90410 Acquired total absence of pancreas: Secondary | ICD-10-CM

## 2023-11-27 DIAGNOSIS — Z1211 Encounter for screening for malignant neoplasm of colon: Secondary | ICD-10-CM

## 2023-11-27 DIAGNOSIS — Z9049 Acquired absence of other specified parts of digestive tract: Secondary | ICD-10-CM

## 2023-11-27 DIAGNOSIS — K831 Obstruction of bile duct: Secondary | ICD-10-CM

## 2023-11-27 LAB — CBC
HCT: 38.6 % — ABNORMAL LOW (ref 39.0–52.0)
Hemoglobin: 13 g/dL (ref 13.0–17.0)
MCHC: 33.7 g/dL (ref 30.0–36.0)
MCV: 86.1 fl (ref 78.0–100.0)
Platelets: 161 K/uL (ref 150.0–400.0)
RBC: 4.49 Mil/uL (ref 4.22–5.81)
RDW: 15.1 % (ref 11.5–15.5)
WBC: 7.3 K/uL (ref 4.0–10.5)

## 2023-11-27 LAB — COMPREHENSIVE METABOLIC PANEL WITH GFR
ALT: 108 U/L — ABNORMAL HIGH (ref 0–53)
AST: 96 U/L — ABNORMAL HIGH (ref 0–37)
Albumin: 3.6 g/dL (ref 3.5–5.2)
Alkaline Phosphatase: 292 U/L — ABNORMAL HIGH (ref 39–117)
BUN: 14 mg/dL (ref 6–23)
CO2: 28 meq/L (ref 19–32)
Calcium: 8.4 mg/dL (ref 8.4–10.5)
Chloride: 99 meq/L (ref 96–112)
Creatinine, Ser: 1 mg/dL (ref 0.40–1.50)
GFR: 79.98 mL/min (ref >60.00)
Glucose, Bld: 152 mg/dL — ABNORMAL HIGH (ref 70–99)
Potassium: 4.4 meq/L (ref 3.5–5.1)
Sodium: 132 meq/L — ABNORMAL LOW (ref 135–145)
Total Bilirubin: 0.5 mg/dL (ref 0.2–1.2)
Total Protein: 6 g/dL (ref 6.0–8.3)

## 2023-11-27 LAB — GAMMA GT: GGT: 203 U/L — ABNORMAL HIGH (ref 7–51)

## 2023-11-27 NOTE — Progress Notes (Signed)
 GASTROENTEROLOGY OUTPATIENT CLINIC VISIT   Primary Care Provider Tisovec, Charlie ORN, MD [address] [place] [address] [phone]  Referring Provider Dr. San  Patient Profile: Robert Mercer is a 63 y.o. 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  The patient presents to the [facility] for an evaluation and management of problem(s) noted below:  Problem List 1. Biliary stricture   2. Abnormal LFTs   3. History of Whipple procedure   4. Colon cancer screening    Discussed the use of AI scribe software for clinical note transcription with the patient, who gave verbal consent to proceed.  History of Present Illness Please see prior notes for full details of HPI.  Interval History This patient previously saw Dr. San but is returning to our care.  His history is documented in the chart.  Briefly, he has a history of biliary stricturing requiring ERCP and eventual Whipple procedure which showed he actually had B-Cell Lymphoma and completed his treatment.  He had recurrent stricturing disease of his anastomosis requiring PTBD dilations and stents/exchanges.  Eventually he had PTBD removed, and stent removed.  He has had ongoing abnormal LFTs.  He has never had another ERCP attempt after Whipple.  He recently saw [facility] for an opinion, and they offered surgical options, and he has held off on that currently.  He describes infrequent pruritus episodes on his legs.  Currently however, he is doing and feeling well.  No routine pruritus, dark urine, jaundice, or abnormal stool color.  He reports variability in his stools since his Whipple.  He is not on PERT.  He has not had a colonoscopy in over five years and had been  getting these done every 5-years as a result of his cancer history.  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   GI Review of Systems Positive as above Negative for pain, melena, hematochezia  Review of Systems General: Denies fevers/chills/weight loss unintentionally Cardiovascular: Denies chest pain Pulmonary: Denies shortness of breath Gastroenterological: See HPI Genitourinary: Denies darkened urine Hematological: Denies easy bruising/bleeding Dermatological: Denies jaundice Psychological: Mood is stable  Medications Current Outpatient Medications  Medication Sig Dispense Refill   albuterol  (VENTOLIN  HFA) 108 (90 Base) MCG/ACT inhaler Inhale 2 puffs into the lungs every 6 (six) hours as needed for wheezing or shortness of breath. 8 g 1   aspirin  81 MG tablet Take 81 mg by mouth daily.     BYSTOLIC  10 MG tablet Take 10 mg by mouth daily.  1   cyanocobalamin  (,VITAMIN B-12,) 1000 MCG/ML injection Inject 1,000 mcg into the muscle every 30 (thirty) days.      glucosamine-chondroitin 500-400 MG tablet Take 1 tablet by mouth daily.      NOVOLOG  100 UNIT/ML injection Mini med insulin  pump     pantoprazole  (PROTONIX ) 40 MG tablet Take 40 mg by mouth daily.     valsartan  (DIOVAN ) 80 MG tablet Take 1 tablet (80 mg total) by mouth daily. 30 tablet 0   Vitamin D, Ergocalciferol, (DRISDOL) 50000 UNITS CAPS Take 50,000 Units by mouth every Monday.      No current facility-administered medications for this visit.    Allergies Allergies  Allergen Reactions   Azilsartan Other (See Comments)    Possibly caused hives (as Edarbyclor) on 06/16/14   Chlorthalidone Other (  See Comments)    Possibly caused hives (as Edarbyclor) on 06/16/14   Ezetimibe Other (See Comments)   Shellfish Allergy    Statins Other (See Comments)   Amlodipine Hives, Itching and Rash   Indapamide  Rash    Pruritic rash   Lisinopril Hives, Itching and Rash    Histories Past Medical History:  Diagnosis Date    Arthritis    left ankle; I broke it in high school (09/03/2016)   Childhood asthma    Diabetes mellitus without complication (HCC)    Novolog  insulin  pump.Average fasting blood sugar runs 145   Diabetic retinopathy associated with type 1 diabetes mellitus (HCC)    Diffuse large B cell lymphoma (HCC)    Dyslipidemia    GERD (gastroesophageal reflux disease)    takes Omeprazole daily   History of blood transfusion 2003   related to Whipple; developed itching/blotching X 1; transfused later also related to the Whipple; no problems (09/03/2016)   Hypertension    takes Bystolic  and Diovan  daily   Joint pain    Nocturia    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 Jason   Sleep apnea    mild to moderate; couldn't tolerate CPAP; I wear an appliance (09/03/2016)   Vitamin B12 deficiency    takes B12 every 30 days.   Vitamin D deficiency    takes Vitamin D daily   Past Surgical History:  Procedure Laterality Date   CHOLECYSTECTOMY  2003   COLONOSCOPY  05/2007   ESOPHAGOGASTRODUODENOSCOPY     EUS N/A 07/23/2012   Procedure: FULL UPPER ENDOSCOPIC ULTRASOUND (EUS) RADIAL;  Surgeon: Elsie Cree, MD;  Location: [facility];  Service: Endoscopy;  Laterality: N/A;   HERNIA REPAIR     INSERTION OF MESH N/A 09/03/2016   Procedure: INSERTION OF MESH;  Surgeon: Krystal Spinner, MD;  Location: [facility] OR;  Service: General;  Laterality: N/A;   LAPAROSCOPIC INCISIONAL / UMBILICAL / VENTRAL HERNIA REPAIR     w/mesh/notes 09/03/2016   percuteaneous puncture  X 3   to place stent in one of my ducts related to scar tissue from Whipple   PILONIDAL CYST EXCISION  1981   VENTRAL HERNIA REPAIR N/A 09/03/2016   Procedure: LAPAROSCOPIC VENTRAL INCISIONAL  HERNIA REPAIR;  Surgeon: Krystal Spinner, MD;  Location: [facility] OR;  Service: General;  Laterality: N/A;   WHIPPLE PROCEDURE  2003   pancreatic lymphoma    WISDOM TOOTH EXTRACTION     Social History   Socioeconomic History   Marital status: Single    Spouse name: Not on file   Number of children: Not on file   Years of education: Not on file   Highest education level: Not on file  Occupational History   Not on file  Tobacco Use   Smoking status: Never   Smokeless tobacco: Never  Vaping Use   Vaping status: Never Used  Substance and Sexual Activity   Alcohol  use: Yes    Alcohol /week: 6.0 standard drinks of alcohol     Types: 6 Cans of beer per week   Drug use: No   Sexual activity: Not Currently  Other Topics Concern   Not on file  Social History Narrative   Not on file   Social Drivers of Health   Financial Resource Strain: Not on file  Food Insecurity: Low Risk  (02/08/2023)   Received from [facility]   Hunger  Vital Sign    Within the past 12 months, you worried that your food would run out before you got money to buy more: Never true    Within the past 12 months, the food you bought just didn't last and you didn't have money to get more. : Never true  Transportation Needs: No Transportation Needs (02/08/2023)   Received from [facility]    In the past 12 months, has lack of reliable transportation kept you from medical appointments, meetings, work or from getting things needed for daily living? : No  Physical Activity: Not on file  Stress: Not on file  Social Connections: Not on file  Intimate Partner Violence: Patient Declined (05/03/2022)   Humiliation, Afraid, Rape, and Kick questionnaire    Fear of Current or Ex-Partner: Patient declined    Emotionally Abused: Patient declined    Physically Abused: Patient declined    Sexually Abused: Patient declined   Family History  Problem Relation Age of Onset   Colon polyps Maternal Grandfather    Colon cancer Neg Hx    Rectal cancer Neg Hx    Esophageal cancer Neg Hx    Inflammatory bowel disease Neg Hx    Liver disease Neg Hx    Pancreatic cancer  Neg Hx    Stomach cancer Neg Hx    I have reviewed his medical, social, and family history in detail and updated the electronic medical record as necessary.    PHYSICAL EXAMINATION  BP 138/60   Pulse 64   Ht 5' 9 (1.753 m)   Wt 200 lb (90.7 kg)   BMI 29.53 kg/m  Wt Readings from Last 3 Encounters:  11/27/23 200 lb (90.7 kg)  05/17/22 205 lb (93 kg)  05/03/22 210 lb (95.3 kg)  GEN: NAD, appears stated age, doesn't appear chronically ill PSYCH: Cooperative, without pressured speech EYE: Conjunctivae pink, sclerae anicteric ENT: MMM CV: Nontachycardic RESP: No audible wheezing GI: NABS, soft, protuberant abdomen, NT/ND, without rebound MSK/EXT: No significant lower extremity edema SKIN: No jaundice NEURO:  Alert & Oriented x 3, no focal deficits   REVIEW OF DATA  I reviewed the following data at the time of this encounter:  GI Procedures and Studies  2019 colonoscopy Unremarkable 5-year colonoscopy recall recommended by provider due to history of previous GI cancer prior polyps  Laboratory Studies  Reviewed those in epic  Imaging Studies  Developed anastomotic stricture s/p IR placement of PBD 07/2012 with resolution of stricture 10/2012 ([facility]).  10/03/2018 MRCP: Minimal intrahepatic biliary ductal dilatation with some irregular beading appearance of the left hepatic duct and the common hepatic duct at the hilar confluence. 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 Postsurgical Whipple changes.  Colonoscopy 07/2017 (Dr. Luis): Normal. Recommended repeat in 5 years due to history of polyps  03/21/2021 CT: evidence of prior ccy, Whipple. Pneumobilia but otherwise normal-appearing liver. Atrophic remaining pancreas without PD dilation. Focal dilation of the small bowel in the RUQ adjacent to the anastomosis. Normal-appearing colon. Small to moderate size left inguinal hernia containing portions of the bladder  03/22/2021 RUQ US : ccy, normal-appearing liver with 2.7 mm  CBD. Normal Dopplers  04/20/2021: MRCP: Stable mild to moderate intrahepatic biliary ductal dilatation without obstructing lesion. Postsurgical Whipple changes.  05/03/2022: CT A/P: Whipple anatomy, fluid-filled esophagus c/w GERD or dysmotility, progressed general intrahepatic biliary duct dilation from 1 year prior  05/05/2022: MRCP: Asymmetric arterial enhancement in the left lobe of the liver suspicious for foci of small abscesses,  morphologic features suggestive of early cirrhosis, moderate intrahepatic duct dilatation unchanged from previous imaging. Cannot exclude underlying stricture at choledochojejunostomy site. Whipple anatomy. Recommended repeat MRI in 3 months  CT a/p W/ 10/04/22: No suspicious focal abnormality within the liver parenchyma. Intrahepatic biliary duct dilatation again noted, similar to prior. Gallbladder surgically absent. Pneumobilia compatible with the history of biliary enteric anastomosis. Pancreas body and tail are diffusely atrophic. No main duct dilatation. Pancreatic head surgically absent. Focal small bowel wall thickening is seen in the anterior abdomen just cranial to the umbilicus, containing fecalized small bowel contents. This is in close proximity to a staple line and appears to represent small bowel anastomosis on coronal imaging (29/7) suggesting atony from small bowel anastomosis. Similar findings noted on the previous exam. No acute findings in the abdomen or pelvis. Lymph nodes in the central small bowel mesentery are slightly more prominent than on the prior study but measure only upper normal for size. Status post Whipple procedure. Left groin hernia containing a portion of the anterior left bladder, similar to prior and without complicating features today.   March 2025 [facility] CT pancreas protocol FINDINGS:   Pancreaticoduodenectomy with Roux-en-Y reconstruction.  - The hepaticojejunostomy is patent. Minimal intrahepatic biliary dilatation is improved  when compared with CT 11/29/2022. There is mild inflammatory wall thickening and hyperenhancement of the extrahepatic bile ducts.  - There is severe atrophy of the remaining pancreatic parenchyma. The main pancreatic duct is not dilated.  - The gastrojejunostomy is widely patent.  - The jejunojejunostomy is positioned within the anterior abdomen, just to the right of midline.  - The small bowel of the pancreaticobiliary, alimentary and common channel are normal caliber.   No mass or pathologically enlarged lymph nodes in the abdomen or pelvis.   Mild atrophy of the right renal upper pole. Normal appearance of the left kidney, adrenal glands and spleen. Large direct left inguinal hernia contains a portion of the urinary bladder. 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 Diffuse  atherosclerosis of the aortoiliac and visceral arteries.  IMPRESSION:  1. Hepaticojejunostomy with Roux-en-Y reconstruction. Mild findings of inflammation are associated with the extrahepatic bile duct. Intrahepatic biliary tree is only mildly dilated, and the dilatation has improved when compared with 11/29/2022.  2. No mass or lymphadenopathy in the abdomen or pelvis.    ASSESSMENT  Mr. Sagraves is a 63 y.o. 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  The patient is seen today for evaluation and management of:  1. Biliary stricture   2. Abnormal LFTs   3. History of Whipple procedure   4. Colon cancer screening    The patient is clinically and hemodynamically stable at this time.  Complex biliary stricturing disease with history of Whipple in the past.  No ERCP attempts since his Whipple.  ERCP and post Whipple anatomy is more complex and our ability to reach the area with typical ERCP  scopes is much less likely though sometimes achievable.  Device assisted ERCP can be helpful to evaluate those sites as well.  Patient's concern about undergoing recurring PTC/PTBD is reasonable to have.  Will plan to get updated laboratories.  From there we will decide timing of repeat imaging, most likely MRI/MRCP versus repeat pancreas protocol CT abdomen.  This will be able to then be compared with [facility] imaging.  From  there we can decide the role of increased risk ERCP attempt to try to reach the area and evaluate for stricturing disease (either by myself or with referral to [facility] to consider device assisted ERCP).  He previously had colonoscopy in 2019 and was recommended 5-year follow-up as a result of polyps and as well as underlying colon cancer so at some point later this year we should proceed with colon cancer screening.  All patient questions were answered to the best of my ability, and the patient agrees to the aforementioned plan of action with follow-up as indicated.   PLAN  Laboratories as outlined below Based on these results we will consider MRI/MRCP versus pancreas protocol CT abdomen High risk ERCP attempt here versus referral to [facility] for device assisted Colon cancer screening at some point later this year   Orders Placed This Encounter  Procedures   CBC   Comp Met (CMET)   Gamma GT    New Prescriptions   No medications on file   Modified Medications   No medications on file    Planned Follow Up No follow-ups on file.   Total Time in Face-to-Face and in Coordination of Care for patient including independent/personal interpretation/review of prior testing, medical history, examination, medication adjustment, communicating results with the patient directly, and documentation within the EHR is 40 minutes (extensive review of multiple imaging studies and multiple GI consultants hepatobiliary consultants over the course of the last 5 years).   Aloha Finner,  MD [facility] Advanced Endoscopy Office # [phone]

## 2023-11-27 NOTE — Patient Instructions (Signed)
 Your provider has requested that you go to the basement level for lab work before leaving today. Press B on the elevator. The lab is located at the first door on the left as you exit the elevator.    Further work-up pending lab results.   Due to recent changes in healthcare laws, you may see the results of your imaging and laboratory studies on MyChart before your provider has had a chance to review them.  We understand that in some cases there may be results that are confusing or concerning to you. Not all laboratory results come back in the same time frame and the provider may be waiting for multiple results in order to interpret others.  Please give us  48 hours in order for your provider to thoroughly review all the results before contacting the office for clarification of your results.   _______________________________________________________  If your blood pressure at your visit was 140/90 or greater, please contact your primary care physician to follow up on this.  _______________________________________________________  If you are age 12 or older, your body mass index should be between 23-30. Your Body mass index is 29.53 kg/m. If this is out of the aforementioned range listed, please consider follow up with your Primary Care Provider.  If you are age 68 or younger, your body mass index should be between 19-25. Your Body mass index is 29.53 kg/m. If this is out of the aformentioned range listed, please consider follow up with your Primary Care Provider.   ________________________________________________________  The [facility] providers would like to encourage you to use MYCHART to communicate with providers for non-urgent requests or questions.  Due to long hold times on the telephone, sending your provider a message by [facility] may be a faster and more efficient way to get a response.  Please allow 48 business hours for a response.  Please remember that this is for non-urgent requests.   _______________________________________________________  Thank you for choosing me and [facility].  Dr. Wilhelmenia

## 2023-11-29 ENCOUNTER — Other Ambulatory Visit: Payer: Self-pay

## 2023-11-29 ENCOUNTER — Ambulatory Visit: Payer: Self-pay | Admitting: Gastroenterology

## 2023-11-29 DIAGNOSIS — Z90410 Acquired total absence of pancreas: Secondary | ICD-10-CM

## 2023-11-29 DIAGNOSIS — R748 Abnormal levels of other serum enzymes: Secondary | ICD-10-CM

## 2023-11-29 DIAGNOSIS — K831 Obstruction of bile duct: Secondary | ICD-10-CM

## 2023-11-29 DIAGNOSIS — R7989 Other specified abnormal findings of blood chemistry: Secondary | ICD-10-CM

## 2023-11-29 DIAGNOSIS — C8599 Non-Hodgkin lymphoma, unspecified, extranodal and solid organ sites: Secondary | ICD-10-CM

## 2023-11-29 NOTE — Progress Notes (Signed)
 Order placed for MRI/MRCP has been placed. Patient will be contacted to schedule by [facility] Radiology Scheduling.

## 2023-12-01 ENCOUNTER — Encounter: Payer: Self-pay | Admitting: Gastroenterology

## 2023-12-01 DIAGNOSIS — Z9049 Acquired absence of other specified parts of digestive tract: Secondary | ICD-10-CM | POA: Insufficient documentation

## 2023-12-01 DIAGNOSIS — R7989 Other specified abnormal findings of blood chemistry: Secondary | ICD-10-CM | POA: Insufficient documentation

## 2023-12-01 DIAGNOSIS — Z1211 Encounter for screening for malignant neoplasm of colon: Secondary | ICD-10-CM | POA: Insufficient documentation

## 2023-12-01 DIAGNOSIS — K831 Obstruction of bile duct: Secondary | ICD-10-CM | POA: Insufficient documentation

## 2023-12-12 ENCOUNTER — Other Ambulatory Visit: Payer: Self-pay | Admitting: Gastroenterology

## 2023-12-12 ENCOUNTER — Ambulatory Visit (HOSPITAL_COMMUNITY)
Admission: RE | Admit: 2023-12-12 | Discharge: 2023-12-12 | Disposition: A | Discharge status: Home / Self Care | Source: Ambulatory Visit | Attending: Gastroenterology | Admitting: Gastroenterology

## 2023-12-12 DIAGNOSIS — K8689 Other specified diseases of pancreas: Secondary | ICD-10-CM | POA: Diagnosis not present

## 2023-12-12 DIAGNOSIS — R748 Abnormal levels of other serum enzymes: Secondary | ICD-10-CM | POA: Diagnosis not present

## 2023-12-12 DIAGNOSIS — C8599 Non-Hodgkin lymphoma, unspecified, extranodal and solid organ sites: Secondary | ICD-10-CM | POA: Diagnosis not present

## 2023-12-12 DIAGNOSIS — K831 Obstruction of bile duct: Secondary | ICD-10-CM | POA: Insufficient documentation

## 2023-12-12 DIAGNOSIS — R7989 Other specified abnormal findings of blood chemistry: Secondary | ICD-10-CM

## 2023-12-12 DIAGNOSIS — Z9049 Acquired absence of other specified parts of digestive tract: Secondary | ICD-10-CM

## 2023-12-12 DIAGNOSIS — C8590 Non-Hodgkin lymphoma, unspecified, unspecified site: Secondary | ICD-10-CM | POA: Diagnosis not present

## 2023-12-12 DIAGNOSIS — Z90410 Acquired total absence of pancreas: Secondary | ICD-10-CM | POA: Insufficient documentation

## 2023-12-12 MED ORDER — GADOBUTROL 1 MMOL/ML IV SOLN
9.0000 mL | Freq: Once | INTRAVENOUS | Status: AC | PRN
  Administered 2023-12-12: 9 mL via INTRAVENOUS

## 2023-12-14 ENCOUNTER — Ambulatory Visit: Payer: Self-pay | Admitting: Gastroenterology

## 2023-12-16 ENCOUNTER — Other Ambulatory Visit: Payer: Self-pay

## 2023-12-16 DIAGNOSIS — C8599 Non-Hodgkin lymphoma, unspecified, extranodal and solid organ sites: Secondary | ICD-10-CM

## 2023-12-16 DIAGNOSIS — R7989 Other specified abnormal findings of blood chemistry: Secondary | ICD-10-CM

## 2023-12-16 DIAGNOSIS — K831 Obstruction of bile duct: Secondary | ICD-10-CM

## 2023-12-17 ENCOUNTER — Encounter: Payer: Self-pay | Admitting: Gastroenterology

## 2023-12-20 DIAGNOSIS — E109 Type 1 diabetes mellitus without complications: Secondary | ICD-10-CM | POA: Diagnosis not present

## 2023-12-20 DIAGNOSIS — R82998 Other abnormal findings in urine: Secondary | ICD-10-CM | POA: Diagnosis not present

## 2023-12-20 DIAGNOSIS — R350 Frequency of micturition: Secondary | ICD-10-CM | POA: Diagnosis not present

## 2023-12-20 DIAGNOSIS — E559 Vitamin D deficiency, unspecified: Secondary | ICD-10-CM | POA: Diagnosis not present

## 2023-12-20 DIAGNOSIS — E538 Deficiency of other specified B group vitamins: Secondary | ICD-10-CM | POA: Diagnosis not present

## 2023-12-20 DIAGNOSIS — I10 Essential (primary) hypertension: Secondary | ICD-10-CM | POA: Diagnosis not present

## 2024-01-03 ENCOUNTER — Encounter: Payer: Self-pay | Admitting: Gastroenterology

## 2024-01-03 ENCOUNTER — Other Ambulatory Visit: Payer: Self-pay

## 2024-01-03 NOTE — Progress Notes (Signed)
 Case discussed and reviewed at multidisciplinary conference this week. Imaging reviewed and suggest that most recent imaging shows some improvement in biliary dilation compared to prior imaging in the chart. Concern from team is that he is having intermittent episodes of sludge/obstruction intermittently. This could be due to anastomosis issues, but currently no clear path for interventional radiology to reach that area due to ductal dilation not being severe. Concern from team also agrees that long-term intermittent obstruction could lead to secondary biliary cirrhosis. There may be a role for liver biopsy in the future. If there is finding on liver biopsy of long-term obstruction, then we will need to consider device assisted ERCP elsewhere versus attempt here for ERCP.  I will relay this information to the patient via my team. I will see him for a colonoscopy later this year. Set him up for a clinic visit at his convenience this year as well.   Aloha Finner, MD [facility] Advanced Endoscopy Office # [phone]

## 2024-01-03 NOTE — Progress Notes (Signed)
 The proposed treatment discussed in conference is for discussion purpose only and is not a binding recommendation.  The patients have not been physically examined, or presented with their treatment options.  Therefore, final treatment plans cannot be decided.

## 2024-01-06 NOTE — Progress Notes (Signed)
 Left message on machine to call back

## 2024-01-06 NOTE — Progress Notes (Signed)
 The patient has been notified of this information and all questions answered.  He will keep appts as planned

## 2024-01-10 DIAGNOSIS — E109 Type 1 diabetes mellitus without complications: Secondary | ICD-10-CM | POA: Diagnosis not present

## 2024-01-10 DIAGNOSIS — Z1331 Encounter for screening for depression: Secondary | ICD-10-CM | POA: Diagnosis not present

## 2024-01-10 DIAGNOSIS — I10 Essential (primary) hypertension: Secondary | ICD-10-CM | POA: Diagnosis not present

## 2024-01-10 DIAGNOSIS — Z1339 Encounter for screening examination for other mental health and behavioral disorders: Secondary | ICD-10-CM | POA: Diagnosis not present

## 2024-01-10 DIAGNOSIS — Z0000 Encounter for general adult medical examination without abnormal findings: Secondary | ICD-10-CM | POA: Diagnosis not present

## 2024-01-10 DIAGNOSIS — Z23 Encounter for immunization: Secondary | ICD-10-CM | POA: Diagnosis not present

## 2024-01-17 ENCOUNTER — Telehealth: Payer: Self-pay

## 2024-01-17 NOTE — Telephone Encounter (Signed)
 RN prepping chart for patient's upcoming procedure observed that patient has insulin  pump. He will need instructions regarding pump management prior to procedure.  RN communicated with Dr. Melba RN to contact patient's PCP/Diabetes Coordinator for pump management instructions which will be needed for patient during his PV appt.

## 2024-01-17 NOTE — Telephone Encounter (Signed)
 Insulin  pump letter has been sent to the PCP

## 2024-02-03 NOTE — Telephone Encounter (Signed)
 Insulin  pump instructions are needed from patient's PCP prior to his pre-visit on 02/07/24.

## 2024-02-03 NOTE — Telephone Encounter (Signed)
 I sent the letter to the PCP- I have not gotten anything back from them. I will resend

## 2024-02-06 DIAGNOSIS — L209 Atopic dermatitis, unspecified: Secondary | ICD-10-CM | POA: Diagnosis not present

## 2024-02-06 DIAGNOSIS — L720 Epidermal cyst: Secondary | ICD-10-CM | POA: Diagnosis not present

## 2024-02-06 NOTE — Telephone Encounter (Signed)
 Received a response from Dr Tisovec. No adjustments are needed with this patient's insulin  pump. Normal device use will protect against any low CBG and suspend the pump.  I am sending the original to be scanned into the patient's chart.  Acopy will be on Patty, RN's desk. Does the Pre-visit want to put a copy on the patient's file for his visit with you on 02/07/24?

## 2024-02-06 NOTE — Telephone Encounter (Signed)
 Yes Pre Visit would like the copy to put with the patients paperwork.  I'll have one of the nurse come to your office and pick it up thank you.

## 2024-02-07 ENCOUNTER — Other Ambulatory Visit: Payer: Self-pay

## 2024-02-07 ENCOUNTER — Ambulatory Visit (AMBULATORY_SURGERY_CENTER)

## 2024-02-07 VITALS — Ht 69.0 in | Wt 205.0 lb

## 2024-02-07 DIAGNOSIS — Z1211 Encounter for screening for malignant neoplasm of colon: Secondary | ICD-10-CM

## 2024-02-07 MED ORDER — NA SULFATE-K SULFATE-MG SULF 17.5-3.13-1.6 GM/177ML PO SOLN: 1.0000 | Freq: Once | ORAL | 0 refills | Status: AC

## 2024-02-07 NOTE — Progress Notes (Signed)
 Denies allergies to eggs or soy products. Denies complication of anesthesia or sedation. Denies use of weight loss medication. Denies use of O2.   Emmi instructions given for colonoscopy. Instructions for managing insulin  pump were relayed to this patient. Instructions were from  Dr.Tisovic. Patient verbalizes understanding.

## 2024-02-10 ENCOUNTER — Encounter: Payer: Self-pay | Admitting: Gastroenterology

## 2024-02-25 ENCOUNTER — Encounter: Payer: Self-pay | Admitting: Gastroenterology

## 2024-02-25 ENCOUNTER — Ambulatory Visit (AMBULATORY_SURGERY_CENTER): Admitting: Gastroenterology

## 2024-02-25 VITALS — BP 148/67 | HR 68 | Temp 98.7°F | Resp 15 | Ht 69.0 in | Wt 205.0 lb

## 2024-02-25 DIAGNOSIS — K649 Unspecified hemorrhoids: Secondary | ICD-10-CM | POA: Diagnosis not present

## 2024-02-25 DIAGNOSIS — K640 First degree hemorrhoids: Secondary | ICD-10-CM

## 2024-02-25 DIAGNOSIS — Z1211 Encounter for screening for malignant neoplasm of colon: Secondary | ICD-10-CM

## 2024-02-25 DIAGNOSIS — K635 Polyp of colon: Secondary | ICD-10-CM

## 2024-02-25 DIAGNOSIS — D123 Benign neoplasm of transverse colon: Secondary | ICD-10-CM

## 2024-02-25 MED ORDER — SODIUM CHLORIDE 0.9 % IV SOLN: 500.0000 mL | Freq: Once | INTRAVENOUS | Status: DC

## 2024-02-25 NOTE — Progress Notes (Signed)
 Called to room to assist during endoscopic procedure.  Patient ID and intended procedure confirmed with present staff. Received instructions for my participation in the procedure from the performing physician.

## 2024-02-25 NOTE — Progress Notes (Signed)
 Pt's states no medical or surgical changes since previsit or office visit.

## 2024-02-25 NOTE — Patient Instructions (Signed)
 High fiber diet Use Fibercon 1-2 tablets PO daily Awaiting pathology results, Repeat colonoscopy in 5 years for surveillance.  YOU HAD AN ENDOSCOPIC PROCEDURE TODAY AT THE [facility]:   Refer to the procedure report that was given to you for any specific questions about what was found during the examination.  If the procedure report does not answer your questions, please call your gastroenterologist to clarify.  If you requested that your care partner not be given the details of your procedure findings, then the procedure report has been included in a sealed envelope for you to review at your convenience later.  YOU SHOULD EXPECT: Some feelings of bloating in the abdomen. Passage of more gas than usual.  Walking can help get rid of the air that was put into your GI tract during the procedure and reduce the bloating. If you had a lower endoscopy (such as a colonoscopy or flexible sigmoidoscopy) you may notice spotting of blood in your stool or on the toilet paper. If you underwent a bowel prep for your procedure, you may not have a normal bowel movement for a few days.  Please Note:  You might notice some irritation and congestion in your nose or some drainage.  This is from the oxygen used during your procedure.  There is no need for concern and it should clear up in a day or so.  SYMPTOMS TO REPORT IMMEDIATELY:  Following lower endoscopy (colonoscopy or flexible sigmoidoscopy):  Excessive amounts of blood in the stool  Significant tenderness or worsening of abdominal pains  Swelling of the abdomen that is new, acute  Fever of 100F or higher  For urgent or emergent issues, a gastroenterologist can be reached at any hour by calling [phone]. Do not use MyChart messaging for urgent concerns.    DIET:  We do recommend a small meal at first, but then you may proceed to your regular diet.  Drink plenty of fluids but you should avoid alcoholic beverages for 24 hours.  ACTIVITY:   You should plan to take it easy for the rest of today and you should NOT DRIVE or use heavy machinery until tomorrow (because of the sedation medicines used during the test).    FOLLOW UP: Our staff will call the number listed on your records the next business day following your procedure.  We will call around 7:15- 8:00 am to check on you and address any questions or concerns that you may have regarding the information given to you following your procedure. If we do not reach you, we will leave a message.     If any biopsies were taken you will be contacted by phone or by letter within the next 1-3 weeks.  Please call us  at [phone] if you have not heard about the biopsies in 3 weeks.    SIGNATURES/CONFIDENTIALITY: You and/or your care partner have signed paperwork which will be entered into your electronic medical record.  These signatures attest to the fact that that the information above on your After Visit Summary has been reviewed and is understood.  Full responsibility of the confidentiality of this discharge information lies with you and/or your care-partner.

## 2024-02-25 NOTE — Progress Notes (Signed)
 GASTROENTEROLOGY PROCEDURE H&P NOTE   Primary Care Physician: Tisovec, Charlie ORN, MD  HPI: Robert Mercer is a 63 y.o. male who presents for Colonoscopy for high risk screening.  Past Medical History:  Diagnosis Date   Allergy    Arthritis    left ankle; I broke it in high school (09/03/2016)   Childhood asthma    Diabetes mellitus without complication (HCC)    Novolog  insulin  pump.Average fasting blood sugar runs 145   Diabetic retinopathy associated with type 1 diabetes mellitus (HCC)    Diffuse large B cell lymphoma (HCC)    Dyslipidemia    GERD (gastroesophageal reflux disease)    takes Omeprazole daily   History of blood transfusion 2003   related to Whipple; developed itching/blotching X 1; transfused later also related to the Whipple; no problems (09/03/2016)   Hypertension    takes Bystolic  and Diovan  daily   Joint pain    Nocturia    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 Jason   Sleep apnea    mild to moderate; couldn't tolerate CPAP; I wear an appliance (09/03/2016)   Vitamin B12 deficiency    takes B12 every 30 days.   Vitamin D deficiency    takes Vitamin D daily   Past Surgical History:  Procedure Laterality Date   CHOLECYSTECTOMY  2003   COLONOSCOPY  05/2007   ESOPHAGOGASTRODUODENOSCOPY     EUS N/A 07/23/2012   Procedure: FULL UPPER ENDOSCOPIC ULTRASOUND (EUS) RADIAL;  Surgeon: Elsie Cree, MD;  Location: [facility];  Service: Endoscopy;  Laterality: N/A;   HERNIA REPAIR     INSERTION OF MESH N/A 09/03/2016   Procedure: INSERTION OF MESH;  Surgeon: Krystal Spinner, MD;  Location: [facility] OR;  Service: General;  Laterality: N/A;   LAPAROSCOPIC INCISIONAL / UMBILICAL / VENTRAL HERNIA REPAIR     w/mesh/notes 09/03/2016   percuteaneous puncture  X 3   to place stent in one of my ducts related to scar tissue from Whipple   PILONIDAL CYST EXCISION   1981   VENTRAL HERNIA REPAIR N/A 09/03/2016   Procedure: LAPAROSCOPIC VENTRAL INCISIONAL  HERNIA REPAIR;  Surgeon: Krystal Spinner, MD;  Location: [facility] OR;  Service: General;  Laterality: N/A;   WHIPPLE PROCEDURE  2003   pancreatic lymphoma   WISDOM TOOTH EXTRACTION     Current Outpatient Medications  Medication Sig Dispense Refill   BYSTOLIC  10 MG tablet Take 10 mg by mouth daily.  1   cyanocobalamin  (,VITAMIN B-12,) 1000 MCG/ML injection Inject 1,000 mcg into the muscle every 30 (thirty) days.      EPINEPHrine 0.3 mg/0.3 mL IJ SOAJ injection Inject 0.3 mg into the muscle.     esomeprazole (NEXIUM) 20 MG capsule TAKE 1 CAPSULE(20 MG) BY MOUTH DAILY     glucosamine-chondroitin 500-400 MG tablet Take 1 tablet by mouth daily.      NOVOLOG  100 UNIT/ML injection Mini med insulin  pump     pantoprazole  (PROTONIX ) 40 MG tablet Take 40 mg by mouth daily.     valsartan  (DIOVAN ) 80 MG tablet Take 1 tablet (80 mg total) by mouth daily. 30 tablet 0   albuterol  (VENTOLIN  HFA) 108 (90 Base) MCG/ACT inhaler Inhale 2 puffs into the lungs every 6 (six) hours as needed for wheezing or shortness of breath. 8 g 1   aspirin  81 MG tablet Take 81 mg by mouth daily.  Chondroitin Sulfate 400 MG CAPS as directed Orally     CREON  36000-114000 units CPEP capsule Take by mouth.     Vitamin D, Ergocalciferol, (DRISDOL) 50000 UNITS CAPS Take 50,000 Units by mouth every Monday.      Current Facility-Administered Medications  Medication Dose Route Frequency Provider Last Rate Last Admin   0.9 %  sodium chloride  infusion  500 mL Intravenous Once Mansouraty, Vasilisa Vore Jr., MD        Current Outpatient Medications:    BYSTOLIC  10 MG tablet, Take 10 mg by mouth daily., Disp: , Rfl: 1   cyanocobalamin  (,VITAMIN B-12,) 1000 MCG/ML injection, Inject 1,000 mcg into the muscle every 30 (thirty) days. , Disp: , Rfl:    EPINEPHrine 0.3 mg/0.3 mL IJ SOAJ injection, Inject 0.3 mg into the muscle., Disp: , Rfl:    esomeprazole (NEXIUM)  20 MG capsule, TAKE 1 CAPSULE(20 MG) BY MOUTH DAILY, Disp: , Rfl:    glucosamine-chondroitin 500-400 MG tablet, Take 1 tablet by mouth daily. , Disp: , Rfl:    NOVOLOG  100 UNIT/ML injection, Mini med insulin  pump, Disp: , Rfl:    pantoprazole  (PROTONIX ) 40 MG tablet, Take 40 mg by mouth daily., Disp: , Rfl:    valsartan  (DIOVAN ) 80 MG tablet, Take 1 tablet (80 mg total) by mouth daily., Disp: 30 tablet, Rfl: 0   albuterol  (VENTOLIN  HFA) 108 (90 Base) MCG/ACT inhaler, Inhale 2 puffs into the lungs every 6 (six) hours as needed for wheezing or shortness of breath., Disp: 8 g, Rfl: 1   aspirin  81 MG tablet, Take 81 mg by mouth daily., Disp: , Rfl:    Chondroitin Sulfate 400 MG CAPS, as directed Orally, Disp: , Rfl:    CREON  36000-114000 units CPEP capsule, Take by mouth., Disp: , Rfl:    Vitamin D, Ergocalciferol, (DRISDOL) 50000 UNITS CAPS, Take 50,000 Units by mouth every Monday. , Disp: , Rfl:   Current Facility-Administered Medications:    0.9 %  sodium chloride  infusion, 500 mL, Intravenous, Once, Mansouraty, Aloha Raddle., MD Allergies  Allergen Reactions   Shellfish Allergy Anaphylaxis   Azilsartan Other (See Comments)    Possibly caused hives (as Edarbyclor) on 06/16/14   Chlorthalidone Other (See Comments)    Possibly caused hives (as Edarbyclor) on 06/16/14   Amlodipine Hives, Itching and Rash   Ezetimibe Other (See Comments)    Pt states does not remember   Indapamide  Rash    Pruritic rash   Lisinopril Hives, Itching and Rash   Statins Other (See Comments)    Muscle pain   Family History  Problem Relation Age of Onset   Colon cancer Mother    Colon polyps Maternal Grandfather    Rectal cancer Neg Hx    Esophageal cancer Neg Hx    Inflammatory bowel disease Neg Hx    Liver disease Neg Hx    Pancreatic cancer Neg Hx    Stomach cancer Neg Hx    Social History   Socioeconomic History   Marital status: Single    Spouse name: Not on file   Number of children: Not on file    Years of education: Not on file   Highest education level: Not on file  Occupational History   Not on file  Tobacco Use   Smoking status: Never   Smokeless tobacco: Never  Vaping Use   Vaping status: Never Used  Substance and Sexual Activity   Alcohol  use: Yes    Alcohol /week: 6.0 standard drinks of alcohol   Types: 6 Cans of beer per week    Comment: occasional   Drug use: No   Sexual activity: Not Currently  Other Topics Concern   Not on file  Social History Narrative   Not on file   Social Drivers of Health   Financial Resource Strain: Not on file  Food Insecurity: Low Risk  (02/08/2023)   Received from [facility]   Hunger Vital Sign    Within the past 12 months, you worried that your food would run out before you got money to buy more: Never true    Within the past 12 months, the food you bought just didn't last and you didn't have money to get more. : Never true  Transportation Needs: No Transportation Needs (02/08/2023)   Received from [facility]    In the past 12 months, has lack of reliable transportation kept you from medical appointments, meetings, work or from getting things needed for daily living? : No  Physical Activity: Not on file  Stress: Not on file  Social Connections: Not on file  Intimate Partner Violence: Patient Declined (05/03/2022)   Humiliation, Afraid, Rape, and Kick questionnaire    Fear of Current or Ex-Partner: Patient declined    Emotionally Abused: Patient declined    Physically Abused: Patient declined    Sexually Abused: Patient declined    Physical Exam: Today's Vitals   02/25/24 0753 02/25/24 0758 02/25/24 0759 02/25/24 0802  BP: (!) 152/65 (!) 176/77  (!) 163/74  Pulse: 61 70 69 63  Resp: 15 13 13 13   Temp:      SpO2: 100% (!) 70% 100% 100%  Weight:      Height:       Body mass index is 30.27 kg/m. GEN: NAD EYE: Sclerae anicteric ENT: MMM CV: Non-tachycardic GI: Soft, NT/ND NEURO:  Alert &  Oriented x 3  Lab Results: No results for input(s): WBC, HGB, HCT, PLT in the last 72 hours. BMET No results for input(s): NA, K, CL, CO2, GLUCOSE, BUN, CREATININE, CALCIUM in the last 72 hours. LFT No results for input(s): PROT, ALBUMIN, AST, ALT, ALKPHOS, BILITOT, BILIDIR, IBILI in the last 72 hours. PT/INR No results for input(s): LABPROT, INR in the last 72 hours.   Impression / Plan: This is a 63 y.o.male who presents for Colonoscopy for high risk screening.  The risks and benefits of endoscopic evaluation/treatment were discussed with the patient and/or family; these include but are not limited to the risk of perforation, infection, bleeding, missed lesions, lack of diagnosis, severe illness requiring hospitalization, as well as anesthesia and sedation related illnesses.  The patient's history has been reviewed, patient examined, no change in status, and deemed stable for procedure.  The patient and/or family is agreeable to proceed.    Aloha Finner, MD [facility] Office # [phone]

## 2024-02-25 NOTE — Progress Notes (Signed)
 To pacu, VSS. Report to [email or web address]

## 2024-02-25 NOTE — Op Note (Signed)
 [facility] Patient Name: Robert Mercer Procedure Date: 02/25/2024 7:32 AM MRN: [id number] Endoscopist: Aloha Finner , MD, [id number] Age: 63 Referring MD:  Date of Birth: November 09, 1960 Gender: Male Account #: [id number] Procedure:                Colonoscopy Indications:              Personal history of malignant neoplasm of the                            pancreas (High risk screening as per prior                            documentation/needs) Medicines:                Monitored Anesthesia Care Procedure:                Pre-Anesthesia Assessment:                           - Prior to the procedure, a History and Physical                            was performed, and patient medications and                            allergies were reviewed. The patient's tolerance of                            previous anesthesia was also reviewed. The risks                            and benefits of the procedure and the sedation                            options and risks were discussed with the patient.                            All questions were answered, and informed consent                            was obtained. Prior Anticoagulants: The patient has                            taken no anticoagulant or antiplatelet agents                            except for aspirin . ASA Grade Assessment: III - A                            patient with severe systemic disease. After                            reviewing the risks and benefits, the patient was  deemed in satisfactory condition to undergo the                            procedure.                           After obtaining informed consent, the colonoscope                            was passed under direct vision. Throughout the                            procedure, the patient's blood pressure, pulse, and                            oxygen saturations were monitored continuously. The                             CF [id number] #[id number] was introduced through the anus                            and advanced to the 3 cm into the ileum. The                            colonoscopy was performed without difficulty. The                            patient tolerated the procedure. The quality of the                            bowel preparation was adequate. The terminal ileum,                            ileocecal valve, appendiceal orifice, and rectum                            were photographed. Scope In: 8:07:41 AM Scope Out: 8:20:21 AM Scope Withdrawal Time: 0 hours 10 minutes 25 seconds  Total Procedure Duration: 0 hours 12 minutes 40 seconds  Findings:                 The digital rectal exam findings include                            hemorrhoids. Pertinent negatives include no                            palpable rectal lesions.                           A large amount of semi-liquid stool was found in                            the entire colon, interfering with visualization.  Lavage of the area was performed using copious                            amounts, resulting in clearance with adequate                            visualization.                           The terminal ileum and ileocecal valve appeared                            normal.                           A 3 mm polyp was found in the transverse colon. The                            polyp was sessile. The polyp was removed with a                            cold snare. Resection and retrieval were complete.                           Normal mucosa was found in the entire colon                            otherwise.                           Non-bleeding non-thrombosed internal hemorrhoids                            were found during retroflexion, during perianal                            exam and during digital exam. The hemorrhoids were                            Grade II (internal hemorrhoids that prolapse but                             reduce spontaneously). Complications:            No immediate complications. Estimated Blood Loss:     Estimated blood loss was minimal. Impression:               - Hemorrhoids found on digital rectal exam.                           - Stool in the entire examined colon. Lavaged with                            adequate visualization.                           -  The examined portion of the ileum was normal.                           - One 3 mm polyp in the transverse colon, removed                            with a cold snare. Resected and retrieved.                           - Normal mucosa in the entire examined colon                            otherwise.                           - Non-bleeding non-thrombosed internal hemorrhoids. Recommendation:           - The patient will be observed post-procedure,                            until all discharge criteria are met.                           - Discharge patient to home.                           - Patient has a contact number available for                            emergencies. The signs and symptoms of potential                            delayed complications were discussed with the                            patient. Return to normal activities tomorrow.                            Written discharge instructions were provided to the                            patient.                           - High fiber diet.                           - Use FiberCon 1-2 tablets PO daily.                           - Continue present medications.                           - Await pathology results.                           - Repeat  colonoscopy in 5 years for surveillance                            (no matter pathology based on patient's previous                            history of GI malignancy).                           - The findings and recommendations were discussed                            with the patient.                            - The findings and recommendations were discussed                            with the patient's family. Aloha Finner, MD 02/25/2024 8:27:38 AM

## 2024-02-26 ENCOUNTER — Telehealth: Payer: Self-pay

## 2024-02-26 NOTE — Telephone Encounter (Signed)
 No answer after follow up call. Left VM.

## 2024-02-27 ENCOUNTER — Ambulatory Visit: Payer: Self-pay | Admitting: Gastroenterology

## 2024-02-27 LAB — SURGICAL PATHOLOGY

## 2024-04-17 DIAGNOSIS — I10 Essential (primary) hypertension: Secondary | ICD-10-CM | POA: Diagnosis not present

## 2024-04-17 DIAGNOSIS — E109 Type 1 diabetes mellitus without complications: Secondary | ICD-10-CM | POA: Diagnosis not present
